# Patient Record
Sex: Male | Born: 1940 | Race: White | Hispanic: No | Marital: Married | Smoking: Never smoker
Health system: Southern US, Community
[De-identification: ages and names within clinical notes are randomized; demographics above are authoritative.]

## PROBLEM LIST (undated history)

## (undated) DIAGNOSIS — I509 Heart failure, unspecified: Secondary | ICD-10-CM

## (undated) DIAGNOSIS — T4145XA Adverse effect of unspecified anesthetic, initial encounter: Secondary | ICD-10-CM

## (undated) DIAGNOSIS — I2510 Atherosclerotic heart disease of native coronary artery without angina pectoris: Secondary | ICD-10-CM

## (undated) DIAGNOSIS — T8859XA Other complications of anesthesia, initial encounter: Secondary | ICD-10-CM

## (undated) DIAGNOSIS — E7800 Pure hypercholesterolemia, unspecified: Secondary | ICD-10-CM

## (undated) DIAGNOSIS — I10 Essential (primary) hypertension: Secondary | ICD-10-CM

## (undated) DIAGNOSIS — G4730 Sleep apnea, unspecified: Secondary | ICD-10-CM

## (undated) HISTORY — PX: CARDIAC SURGERY: SHX584

## (undated) HISTORY — PX: CARDIAC CATHETERIZATION: SHX172

---
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
## 1998-10-13 ENCOUNTER — Encounter: Payer: Self-pay | Admitting: Family Medicine

## 1998-10-13 ENCOUNTER — Ambulatory Visit (HOSPITAL_COMMUNITY): Admission: RE | Admit: 1998-10-13 | Discharge: 1998-10-13 | Payer: Self-pay | Admitting: Family Medicine

## 2014-08-07 ENCOUNTER — Emergency Department (HOSPITAL_BASED_OUTPATIENT_CLINIC_OR_DEPARTMENT_OTHER)
Admission: EM | Admit: 2014-08-07 | Discharge: 2014-08-07 | Disposition: A | Discharge status: Home / Self Care | Payer: Worker's Compensation | Attending: Emergency Medicine | Admitting: Emergency Medicine

## 2014-08-07 ENCOUNTER — Encounter (HOSPITAL_BASED_OUTPATIENT_CLINIC_OR_DEPARTMENT_OTHER): Payer: Self-pay | Admitting: Emergency Medicine

## 2014-08-07 DIAGNOSIS — Z880 Allergy status to penicillin: Secondary | ICD-10-CM | POA: Diagnosis not present

## 2014-08-07 DIAGNOSIS — S99919A Unspecified injury of unspecified ankle, initial encounter: Secondary | ICD-10-CM

## 2014-08-07 DIAGNOSIS — Y9389 Activity, other specified: Secondary | ICD-10-CM | POA: Insufficient documentation

## 2014-08-07 DIAGNOSIS — I10 Essential (primary) hypertension: Secondary | ICD-10-CM | POA: Insufficient documentation

## 2014-08-07 DIAGNOSIS — S8990XA Unspecified injury of unspecified lower leg, initial encounter: Secondary | ICD-10-CM | POA: Insufficient documentation

## 2014-08-07 DIAGNOSIS — S8391XA Sprain of unspecified site of right knee, initial encounter: Secondary | ICD-10-CM

## 2014-08-07 DIAGNOSIS — Z79899 Other long term (current) drug therapy: Secondary | ICD-10-CM | POA: Diagnosis not present

## 2014-08-07 DIAGNOSIS — E7800 Pure hypercholesterolemia, unspecified: Secondary | ICD-10-CM | POA: Diagnosis not present

## 2014-08-07 DIAGNOSIS — IMO0002 Reserved for concepts with insufficient information to code with codable children: Secondary | ICD-10-CM | POA: Diagnosis not present

## 2014-08-07 DIAGNOSIS — S99929A Unspecified injury of unspecified foot, initial encounter: Secondary | ICD-10-CM

## 2014-08-07 DIAGNOSIS — Y929 Unspecified place or not applicable: Secondary | ICD-10-CM | POA: Diagnosis not present

## 2014-08-07 DIAGNOSIS — W11XXXA Fall on and from ladder, initial encounter: Secondary | ICD-10-CM | POA: Insufficient documentation

## 2014-08-07 HISTORY — DX: Essential (primary) hypertension: I10

## 2014-08-07 HISTORY — DX: Pure hypercholesterolemia, unspecified: E78.00

## 2014-08-07 NOTE — Discharge Instructions (Signed)
Joint Sprain °A sprain is a tear or stretch in the ligaments that hold a joint together. Severe sprains may need as long as 3-6 weeks of immobilization and/or exercises to heal completely. Sprained joints should be rested and protected. If not, they can become unstable and prone to re-injury. Proper treatment can reduce your pain, shorten the period of disability, and reduce the risk of repeated injuries. °TREATMENT  °· Rest and elevate the injured joint to reduce pain and swelling. °· Apply ice packs to the injury for 20-30 minutes every 2-3 hours for the next 2-3 days. °· Keep the injury wrapped in a compression bandage or splint as long as the joint is painful or as instructed by your caregiver. °· Do not use the injured joint until it is completely healed to prevent re-injury and chronic instability. Follow the instructions of your caregiver. °· Long-term sprain management may require exercises and/or treatment by a physical therapist. Taping or special braces may help stabilize the joint until it is completely better. °SEEK MEDICAL CARE IF:  °· You develop increased pain or swelling of the joint. °· You develop increasing redness and warmth of the joint. °· You develop a fever. °· It becomes stiff. °· Your hand or foot gets cold or numb. °Document Released: 01/12/2005 Document Revised: 02/27/2012 Document Reviewed: 12/22/2008 °ExitCare® Patient Information ©2015 [employer]. This information is not intended to replace advice given to you by your health care provider. Make sure you discuss any questions you have with your health care provider. ° °

## 2014-08-07 NOTE — ED Notes (Signed)
Pt fell off 3 step ladder onto carpet around 8pm last night, c/o right knee pain and buttock pain. Pt able to ambulate.

## 2014-08-07 NOTE — ED Provider Notes (Signed)
CSN: [id number]     Arrival date & time 08/07/14  0125 History   First MD Initiated Contact with Patient 08/07/14 0133     Chief Complaint  Patient presents with  . Fall      Patient is a 73 y.o. male presenting with knee pain. The history is provided by the patient and a significant other.  Knee Pain Location:  Knee Time since incident:  6 hours Knee location:  R knee Pain details:    Quality:  Aching   Severity:  Mild   Onset quality:  Sudden   Timing:  Constant   Progression:  Unchanged Chronicity:  New Prior injury to area:  No Exacerbated by: walking. Associated symptoms: no back pain, no muscle weakness and no neck pain   PT was on a small step ladder, he fell off landing on his buttocks No head or neck injury He reports during the fall he "twisted" his right leg but no direct trauma to right leg.  He now reports pain in right knee   Past Medical History  Diagnosis Date  . Hypertension   . High cholesterol    History reviewed. No pertinent past surgical history. History reviewed. No pertinent family history. History  Substance Use Topics  . Smoking status: Never Smoker   . Smokeless tobacco: Not on file  . Alcohol Use: No    Review of Systems  Gastrointestinal: Negative for abdominal pain.  Musculoskeletal: Positive for arthralgias. Negative for back pain and neck pain.  Neurological: Negative for headaches.      Allergies  Amoxicillin and Tavist nd  Home Medications   Prior to Admission medications   Medication Sig Start Date End Date Taking? Authorizing Provider  fenofibrate (TRICOR) 145 MG tablet Take 145 mg by mouth daily.   Yes Historical Provider, MD  lisinopril (PRINIVIL,ZESTRIL) 20 MG tablet Take 20 mg by mouth daily.   Yes Historical Provider, MD   BP 158/93  Pulse 73  Temp(Src) 97.8 F (36.6 C) (Oral)  Resp 18  Ht 5\' 10"  (1.778 m)  Wt 200 lb (90.719 kg)  BMI 28.70 kg/m2  SpO2 100% Physical Exam CONSTITUTIONAL: Well developed/well  nourished HEAD: Normocephalic/atraumatic EYES: EOMI/PERRL ENMT: Mucous membranes moist NECK: supple no meningeal signs SPINE:entire spine nontender CV: S1/S2 noted, no murmurs/rubs/gallops noted LUNGS: Lungs are clear to auscultation bilaterally, no apparent distress ABDOMEN: soft, nontender, no rebound or guarding NEURO: Pt is awake/alert, moves all extremitiesx4 EXTREMITIES: pulses normal, full ROM Right knee effusion noted.  No erythema/warmth.  No bruising to knee.  No tenderness to right patella/prox fibula.  He can range right knee with full flex/extension.  He has tenderness with ROM of either hip. All other extremities/joints palpated/ranged and nontender SKIN: warm, color normal PSYCH: no abnormalities of mood noted  ED Course  Procedures  I offered imaging to patient of his right knee but he declined.  Low suspicion for acute bony injury given no direct trauma and no focal tenderness He does have effusion with possible sprain . Advised crutches and f/u with sports medicine MDM   Final diagnoses:  Right knee sprain, initial encounter    Nursing notes including past medical history and social history reviewed and considered in documentation     Joya Gaskinsonald W Catalina Salasar, MD 08/07/14 [phone]

## 2017-03-29 ENCOUNTER — Other Ambulatory Visit: Payer: Self-pay | Admitting: Physical Medicine and Rehabilitation

## 2017-03-29 DIAGNOSIS — N281 Cyst of kidney, acquired: Secondary | ICD-10-CM

## 2017-04-06 ENCOUNTER — Ambulatory Visit: Payer: Self-pay | Admitting: Orthopedic Surgery

## 2017-04-07 ENCOUNTER — Ambulatory Visit: Payer: Self-pay | Admitting: Orthopedic Surgery

## 2017-04-07 NOTE — H&P (Signed)
Joshua Mack is an 76 y.o. male.   Chief Complaint: back and RLE pain HPI: The patient is a 76 year old male who presents with back pain. The patient is here today in referral from Dr. Ethelene Hal. The patient reports low back symptoms including pain which began year(s) ago without any known injury. The pain radiates to the right thigh, right lower leg and right foot. The patient describes the pain as sharp. The patient describes the severity of their symptoms as 9 / 10. Symptoms are exacerbated by standing (and walking), while symptoms are not exacerbated by sitting. Current treatment includes nonsteroidal anti-inflammatory drugs (Ibuprofen). Prior to being seen today the patient was previously evaluated by Dr. Ethelene Hal. Past evaluation has included x-ray of the lumbar spine and MRI of the lumbar spine. Past treatment has included nonsteroidal anti-inflammatory drugs, epidural injections (L5-s1 right), physical therapy and SNRB :5-S1.  Sofia Jaquith is here from a referral from Dr. Ethelene Hal for surgical consultation. The patient reports a long history of back pain and now leg pain predominantly in the right lower extremity. He is limping. He is having numbness and weakness down into the legs. It has been progressive over the past years. Dr. Ethelene Hal has evaluated him and treated him with analgesics, activity modification, and has had him undergo epidural steroid injections. He had lumbar epidural at L5-S1 on the right. He has been treated for also knee pain in the past.  He has been doing essentially home exercise program, physical therapy, this seems to aggravate it, especially with extension.  REVIEW OF SYSTEMS Review of systems is negative for fevers, chest pain, shortness of breath, unexplained recent weight loss, loss of bowel or bladder function, burning with urination, joint swelling, rashes, weakness or numbness, difficulty with balance, easy bruising, excessive thirst or frequent urination.  He is here with  his wife.  He also reports that he still works. If he sits, it relieves his pain. If he stands, it aggravates it. He uses a grocery cart when he goes to the store.  Past Medical History:  Diagnosis Date  . High cholesterol   . Hypertension     No past surgical history on file.  No family history on file. Social History:  reports that he has never smoked. He does not have any smokeless tobacco history on file. He reports that he does not drink alcohol or use drugs.  Allergies:  Allergies  Allergen Reactions  . Amoxicillin   . Tavist Nd [Loratadine]      (Not in a hospital admission)  No results found for this or any previous visit (from the past 48 hour(s)). No results found.  Review of Systems  Constitutional: Negative.   HENT: Negative.   Eyes: Negative.   Respiratory: Negative.   Cardiovascular: Negative.   Gastrointestinal: Negative.   Genitourinary: Negative.   Musculoskeletal: Positive for back pain.  Skin: Negative.   Neurological: Positive for sensory change and focal weakness.  Psychiatric/Behavioral: Negative.     There were no vitals taken for this visit. Physical Exam  Constitutional: He is oriented to person, place, and time. He appears well-developed.  HENT:  Head: Normocephalic.  Eyes: Pupils are equal, round, and reactive to light.  Neck: Normal range of motion.  Cardiovascular: Normal rate.   Respiratory: Effort normal.  GI: Soft.  Musculoskeletal:  On exam, a healthy male, in moderate distress. Mood and affect is appropriate. He is tender in the lumbosacral junction. He has pain with extension, relieved with  forward flexion. Straight leg raise on the right, buttock, thigh, and calf pain and negative on the left. EHL and dorsiflexion on the left is 4/5 compared to the right. He also has decreased plantar flexion. Altered sensation in the L5 dermatome. Slightly weak quad on the right as well at 4+/5.  Lumbar spine exam reveals no evidence of soft  tissue swelling, deformity or skin ecchymosis. No flank pain with percussion. The abdomen is soft and nontender. Nontender over the trochanters. No cellulitis or lymphadenopathy.  Motor is 5/5 including tibialis anterior and hamstrings. Patient is normoreflexic. There is no Babinski or clonus. Patient has good distal pulses. No DVT. No pain and normal range of motion without instability of the hips, knees and ankles.  Painless range of motion of the cervical spine is noted.  Neurological: He is alert and oriented to person, place, and time.    Three-view x-rays, AP, lateral, flexion and extension demonstrates a grade 1 listhesis at 5-1 with end-stage degeneration without instability in flexion and extension. He has a transitional anatomy. He has severe spinal stenosis and congenital stenosis and a disc protrusion at L4-5 and extending into 3-4, moderate to severe on the right. Also encroaching on the left. At 5-1, there is small central disc protrusion. There is moderate foraminal narrowing.  A renal cyst is noted on the left.  Assessment/Plan 1. 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 2. Grade 1 isthmic spondylolisthesis at 5-1, end stage, bone-on-bone without instability in flexion and extension. Moderate neural foraminal narrowing at L5. 3. Moderate stenosis at 2-3. 4. Early foot drop with myotomal weakness, dermatomal dysesthesia on the right despite rest.  Extensive discussion concerning current pathology, relevant anatomy and treatment options; 25 minutes dedicated to this discussion, reviewing his pathology, relevant anatomy. I do feel he has symptomatic spinal stenosis. His predominant lesion is at L4-5 and L3-4. Given minimal back pain, predominantly claudication pain and radicular pain, I feel a microlumbar decompression at 3-4 and 4-5 would be appropriate. I do not feel a concomitant fusion would be would be required, as there is no instability at 5-1. There is  no stenosis at 5-1. 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 I do not feel the additional risk associated with a distraction or body effusion would add to a favorable outcome. I had an extensive discussion of the risks and benefits of the lumbar decompression with the patient including bleeding, infection, damage to neurovascular structures, epidural fibrosis, CSF leak requiring repair. We also discussed increase in pain, adjacent segment disease, recurrent disc herniation, need for future surgery including repeat decompression and/or fusion. We also discussed risks of postoperative hematoma, paralysis, anesthetic complications including DVT, PE, death, cardiopulmonary dysfunction. In addition, the perioperative and postoperative courses were discussed in detail including the rehabilitative time and return to functional activity and work. I provided the patient with an illustrated handout and utilized the appropriate surgical models.  We discussed time in the hospital, recovery, he is still working. He wants to return to work as soon as possible, though I indicated six weeks without lifting or bending, early ambulation. We indicated the severity of the stenosis and possibility of a dural rent. We will obtain preoperative clearance. I would like to re-examine him in a week, particularly on the right, if he is having increasing pain and/or numbness and weakness, we discussed urgency in terms of the decompression if this has been there, which he has described for months, then  certainly we would consider a decompression at an earlier date. I would not recommend continued observation or additional injections given the deficit and the partial foot drop at this point. I did discuss the goal of the decompression is to prevent further deficit. I do not feel he will need a concomitant fusion again. I discussed this with his wife. We will get preop clearance by Dr. Tiburcio Pea, and see him back in  a week for a repeat examination.  I appreciate the kind referral by Dr. Ethelene Hal. In the interim, we discussed Williams flexion program, avoid extension, favor flexion, etc., utilization of a cane given his antalgic gait and partial foot drop or utilization of the boot.  Plan microlumbar decompression L3-4, L4-5, possible L5-S1  Chayce Robbins, Dayna Barker., PA-C for Dr. Shelle Iron 04/07/2017, 11:50 AM

## 2017-04-10 ENCOUNTER — Ambulatory Visit
Admission: RE | Admit: 2017-04-10 | Discharge: 2017-04-10 | Disposition: A | Discharge status: Home / Self Care | Payer: Self-pay | Source: Ambulatory Visit | Attending: Physical Medicine and Rehabilitation | Admitting: Physical Medicine and Rehabilitation

## 2017-04-10 DIAGNOSIS — N281 Cyst of kidney, acquired: Secondary | ICD-10-CM

## 2017-04-11 ENCOUNTER — Other Ambulatory Visit: Payer: Self-pay | Admitting: Specialist

## 2017-04-26 NOTE — Patient Instructions (Addendum)
LEIB ELAHI  04/26/2017   Your procedure is scheduled on: 05-03-17   Report to [facility] Main  Entrance Take [facility]  elevators to 3rd floor to Short Stay Center at 0630 AM.    Call this number if you have problems the morning of surgery [phone]    Remember: ONLY 1 PERSON MAY GO WITH YOU TO SHORT STAY TO GET  READY MORNING OF YOUR SURGERY.  Do not eat food or drink liquids :After Midnight.     Take these medicines the morning of surgery with A SIP OF WATER: Amlodipine (Norvasc)                               You may not have any metal on your body including hair pins and              piercings  Do not wear jewelry, make-up, lotions, powders or perfumes, deodorant             Men may shave face and neck.   Do not bring valuables to the hospital. [facility] IS NOT             RESPONSIBLE   FOR VALUABLES.  Contacts, dentures or bridgework may not be worn into surgery.  Leave suitcase in the car. After surgery it may be brought to your room.       Special Instructions: Please bring the mask and tubing for your CPAP machine              Please read over the following fact sheets you were given: _____________________________________________________________________  [facility] - Preparing for Surgery Before surgery, you can play an important role.  Because skin is not sterile, your skin needs to be as free of germs as possible.  You can reduce the number of germs on your skin by washing with CHG (chlorahexidine gluconate) soap before surgery.  CHG is an antiseptic cleaner which kills germs and bonds with the skin to continue killing germs even after washing. Please DO NOT use if you have an allergy to CHG or antibacterial soaps.  If your skin becomes reddened/irritated stop using the CHG and inform your nurse when you arrive at Short Stay. Do not shave (including legs and underarms) for at least 48 hours prior to the first CHG shower.  You may shave your  face/neck. Please follow these instructions carefully:  1.  Shower with CHG Soap the night before surgery and the  morning of Surgery.  2.  If you choose to wash your hair, wash your hair first as usual with your  normal  shampoo.  3.  After you shampoo, rinse your hair and body thoroughly to remove the  shampoo.                           4.  Use CHG as you would any other liquid soap.  You can apply chg directly  to the skin and wash                       Gently with a scrungie or clean washcloth.  5.  Apply the CHG Soap to your body ONLY FROM THE NECK DOWN.   Do not use on face/ open  Wound or open sores. Avoid contact with eyes, ears mouth and genitals (private parts).                       Wash face,  Genitals (private parts) with your normal soap.             6.  Wash thoroughly, paying special attention to the area where your surgery  will be performed.  7.  Thoroughly rinse your body with warm water from the neck down.  8.  DO NOT shower/wash with your normal soap after using and rinsing off  the CHG Soap.                9.  Pat yourself dry with a clean towel.            10.  Wear clean pajamas.            11.  Place clean sheets on your bed the night of your first shower and do not  sleep with pets. Day of Surgery : Do not apply any lotions/deodorants the morning of surgery.  Please wear clean clothes to the hospital/surgery center.  FAILURE TO FOLLOW THESE INSTRUCTIONS MAY RESULT IN THE CANCELLATION OF YOUR SURGERY PATIENT SIGNATURE_________________________________  NURSE SIGNATURE__________________________________  ________________________________________________________________________   Adam Phenix  An incentive spirometer is a tool that can help keep your lungs clear and active. This tool measures how well you are filling your lungs with each breath. Taking long deep breaths may help reverse or decrease the chance of developing breathing  (pulmonary) problems (especially infection) following:  A long period of time when you are unable to move or be active. BEFORE THE PROCEDURE   If the spirometer includes an indicator to show your best effort, your nurse or respiratory therapist will set it to a desired goal.  If possible, sit up straight or lean slightly forward. Try not to slouch.  Hold the incentive spirometer in an upright position. INSTRUCTIONS FOR USE  1. Sit on the edge of your bed if possible, or sit up as far as you can in bed or on a chair. 2. Hold the incentive spirometer in an upright position. 3. Breathe out normally. 4. Place the mouthpiece in your mouth and seal your lips tightly around it. 5. Breathe in slowly and as deeply as possible, raising the piston or the ball toward the top of the column. 6. Hold your breath for 3-5 seconds or for as long as possible. Allow the piston or ball to fall to the bottom of the column. 7. Remove the mouthpiece from your mouth and breathe out normally. 8. Rest for a few seconds and repeat Steps 1 through 7 at least 10 times every 1-2 hours when you are awake. Take your time and take a few normal breaths between deep breaths. 9. The spirometer may include an indicator to show your best effort. Use the indicator as a goal to work toward during each repetition. 10. After each set of 10 deep breaths, practice coughing to be sure your lungs are clear. If you have an incision (the cut made at the time of surgery), support your incision when coughing by placing a pillow or rolled up towels firmly against it. Once you are able to get out of bed, walk around indoors and cough well. You may stop using the incentive spirometer when instructed by your caregiver.  RISKS AND COMPLICATIONS  Take your time so you do not get  dizzy or light-headed.  If you are in pain, you may need to take or ask for pain medication before doing incentive spirometry. It is harder to take a deep breath if you  are having pain. AFTER USE  Rest and breathe slowly and easily.  It can be helpful to keep track of a log of your progress. Your caregiver can provide you with a simple table to help with this. If you are using the spirometer at home, follow these instructions: SEEK MEDICAL CARE IF:   You are having difficultly using the spirometer.  You have trouble using the spirometer as often as instructed.  Your pain medication is not giving enough relief while using the spirometer.  You develop fever of 100.5 F (38.1 C) or higher. SEEK IMMEDIATE MEDICAL CARE IF:   You cough up bloody sputum that had not been present before.  You develop fever of 102 F (38.9 C) or greater.  You develop worsening pain at or near the incision site. MAKE SURE YOU:   Understand these instructions.  Will watch your condition.  Will get help right away if you are not doing well or get worse. Document Released: 04/17/2007 Document Revised: 02/27/2012 Document Reviewed: 06/18/2007 [facility] LLCExitCare Patient Information 2014 [place]Care, [place].   ________________________________________________________________________

## 2017-04-26 NOTE — Progress Notes (Signed)
03-31-17 Surgical clearance from Dr. Wynelle LinkSun on chart 03-31-17 EKG on chart

## 2017-04-27 ENCOUNTER — Encounter (HOSPITAL_COMMUNITY): Payer: Self-pay

## 2017-04-27 ENCOUNTER — Encounter (HOSPITAL_COMMUNITY)
Admission: RE | Admit: 2017-04-27 | Discharge: 2017-04-27 | Disposition: A | Discharge status: Home / Self Care | Payer: BLUE CROSS/BLUE SHIELD | Source: Ambulatory Visit | Attending: Specialist | Admitting: Specialist

## 2017-04-27 ENCOUNTER — Ambulatory Visit (HOSPITAL_COMMUNITY)
Admission: RE | Admit: 2017-04-27 | Discharge: 2017-04-27 | Disposition: A | Discharge status: Home / Self Care | Payer: BLUE CROSS/BLUE SHIELD | Source: Ambulatory Visit | Attending: Orthopedic Surgery | Admitting: Orthopedic Surgery

## 2017-04-27 DIAGNOSIS — M5126 Other intervertebral disc displacement, lumbar region: Secondary | ICD-10-CM | POA: Diagnosis present

## 2017-04-27 HISTORY — DX: Sleep apnea, unspecified: G47.30

## 2017-04-27 LAB — CBC
HCT: 40.7 % (ref 39.0–52.0)
Hemoglobin: 14.2 g/dL (ref 13.0–17.0)
MCH: 30.6 pg (ref 26.0–34.0)
MCHC: 34.9 g/dL (ref 30.0–36.0)
MCV: 87.7 fL (ref 78.0–100.0)
Platelets: 400 10*3/uL (ref 150–400)
RBC: 4.64 MIL/uL (ref 4.22–5.81)
RDW: 12.7 % (ref 11.5–15.5)
WBC: 8 10*3/uL (ref 4.0–10.5)

## 2017-04-27 LAB — BASIC METABOLIC PANEL
Anion gap: 9 (ref 5–15)
BUN: 13 mg/dL (ref 6–20)
CO2: 28 mmol/L (ref 22–32)
Calcium: 9.6 mg/dL (ref 8.9–10.3)
Chloride: 97 mmol/L — ABNORMAL LOW (ref 101–111)
Creatinine, Ser: 1 mg/dL (ref 0.61–1.24)
GFR calc Af Amer: >60 mL/min (ref >60)
GFR calc non Af Amer: >60 mL/min (ref >60)
Glucose, Bld: 126 mg/dL — ABNORMAL HIGH (ref 65–99)
Potassium: 4.3 mmol/L (ref 3.5–5.1)
Sodium: 134 mmol/L — ABNORMAL LOW (ref 135–145)

## 2017-04-27 LAB — SURGICAL PCR SCREEN
MRSA, PCR: NEGATIVE
Staphylococcus aureus: NEGATIVE

## 2017-05-02 ENCOUNTER — Encounter (HOSPITAL_COMMUNITY): Payer: Self-pay | Admitting: Anesthesiology

## 2017-05-02 NOTE — Anesthesia Preprocedure Evaluation (Addendum)
Anesthesia Evaluation  Patient identified by MRN, date of birth, ID band Patient awake    Reviewed: Allergy & Precautions, NPO status , Patient's Chart, lab work & pertinent test results  Airway Mallampati: IV       Dental no notable dental hx. (+) Teeth Intact   Pulmonary    Pulmonary exam normal breath sounds clear to auscultation       Cardiovascular hypertension, Pt. on medications Normal cardiovascular exam Rhythm:Regular Rate:Normal     Neuro/Psych negative neurological ROS  negative psych ROS   GI/Hepatic negative GI ROS, Neg liver ROS,   Endo/Other  negative endocrine ROS  Renal/GU negative Renal ROS  negative genitourinary   Musculoskeletal   Abdominal Normal abdominal exam  (+)   Peds  Hematology negative hematology ROS (+)   Anesthesia Other Findings   Reproductive/Obstetrics                            Anesthesia Physical Anesthesia Plan  ASA: II  Anesthesia Plan: General   Post-op Pain Management:    Induction: Intravenous  Airway Management Planned: Oral ETT and Video Laryngoscope Planned  Additional Equipment:   Intra-op Plan:   Post-operative Plan: Extubation in OR  Informed Consent: I have reviewed the patients History and Physical, chart, labs and discussed the procedure including the risks, benefits and alternatives for the proposed anesthesia with the patient or authorized representative who has indicated his/her understanding and acceptance.     Plan Discussed with: CRNA and Surgeon  Anesthesia Plan Comments: (No Versed or fentanyl. Substitute Dilaudid for fentanyl.)       Anesthesia Quick Evaluation

## 2017-05-03 ENCOUNTER — Encounter (HOSPITAL_COMMUNITY): Payer: Self-pay | Admitting: *Deleted

## 2017-05-03 ENCOUNTER — Ambulatory Visit (HOSPITAL_COMMUNITY): Payer: BLUE CROSS/BLUE SHIELD

## 2017-05-03 ENCOUNTER — Ambulatory Visit (HOSPITAL_COMMUNITY): Payer: BLUE CROSS/BLUE SHIELD | Admitting: Anesthesiology

## 2017-05-03 ENCOUNTER — Encounter (HOSPITAL_COMMUNITY)
Admission: AD | Disposition: A | Discharge status: Home / Self Care | Payer: Self-pay | Source: Ambulatory Visit | Attending: Specialist

## 2017-05-03 ENCOUNTER — Inpatient Hospital Stay (HOSPITAL_COMMUNITY)
Admission: AD | Admit: 2017-05-03 | Discharge: 2017-05-09 | DRG: 459 | Disposition: A | Discharge status: Home / Self Care | Payer: BLUE CROSS/BLUE SHIELD | Source: Ambulatory Visit | Attending: Specialist | Admitting: Specialist

## 2017-05-03 DIAGNOSIS — M48062 Spinal stenosis, lumbar region with neurogenic claudication: Secondary | ICD-10-CM | POA: Diagnosis present

## 2017-05-03 DIAGNOSIS — E871 Hypo-osmolality and hyponatremia: Secondary | ICD-10-CM

## 2017-05-03 DIAGNOSIS — E7800 Pure hypercholesterolemia, unspecified: Secondary | ICD-10-CM | POA: Diagnosis present

## 2017-05-03 DIAGNOSIS — N281 Cyst of kidney, acquired: Secondary | ICD-10-CM | POA: Diagnosis present

## 2017-05-03 DIAGNOSIS — R609 Edema, unspecified: Secondary | ICD-10-CM | POA: Diagnosis not present

## 2017-05-03 DIAGNOSIS — M21371 Foot drop, right foot: Secondary | ICD-10-CM | POA: Diagnosis present

## 2017-05-03 DIAGNOSIS — J189 Pneumonia, unspecified organism: Secondary | ICD-10-CM | POA: Diagnosis not present

## 2017-05-03 DIAGNOSIS — Z881 Allergy status to other antibiotic agents status: Secondary | ICD-10-CM

## 2017-05-03 DIAGNOSIS — R338 Other retention of urine: Secondary | ICD-10-CM | POA: Diagnosis present

## 2017-05-03 DIAGNOSIS — I82432 Acute embolism and thrombosis of left popliteal vein: Secondary | ICD-10-CM | POA: Diagnosis not present

## 2017-05-03 DIAGNOSIS — Z419 Encounter for procedure for purposes other than remedying health state, unspecified: Secondary | ICD-10-CM

## 2017-05-03 DIAGNOSIS — R0602 Shortness of breath: Secondary | ICD-10-CM

## 2017-05-03 DIAGNOSIS — I340 Nonrheumatic mitral (valve) insufficiency: Secondary | ICD-10-CM | POA: Diagnosis not present

## 2017-05-03 DIAGNOSIS — R0902 Hypoxemia: Secondary | ICD-10-CM | POA: Diagnosis not present

## 2017-05-03 DIAGNOSIS — Z888 Allergy status to other drugs, medicaments and biological substances status: Secondary | ICD-10-CM | POA: Diagnosis not present

## 2017-05-03 DIAGNOSIS — Z79899 Other long term (current) drug therapy: Secondary | ICD-10-CM | POA: Diagnosis not present

## 2017-05-03 DIAGNOSIS — J9601 Acute respiratory failure with hypoxia: Secondary | ICD-10-CM | POA: Diagnosis not present

## 2017-05-03 DIAGNOSIS — I82409 Acute embolism and thrombosis of unspecified deep veins of unspecified lower extremity: Secondary | ICD-10-CM

## 2017-05-03 DIAGNOSIS — K567 Ileus, unspecified: Secondary | ICD-10-CM | POA: Diagnosis not present

## 2017-05-03 DIAGNOSIS — G4733 Obstructive sleep apnea (adult) (pediatric): Secondary | ICD-10-CM

## 2017-05-03 DIAGNOSIS — M48061 Spinal stenosis, lumbar region without neurogenic claudication: Secondary | ICD-10-CM | POA: Diagnosis present

## 2017-05-03 DIAGNOSIS — N401 Enlarged prostate with lower urinary tract symptoms: Secondary | ICD-10-CM | POA: Diagnosis present

## 2017-05-03 DIAGNOSIS — Z885 Allergy status to narcotic agent status: Secondary | ICD-10-CM | POA: Diagnosis not present

## 2017-05-03 DIAGNOSIS — Y838 Other surgical procedures as the cause of abnormal reaction of the patient, or of later complication, without mention of misadventure at the time of the procedure: Secondary | ICD-10-CM | POA: Diagnosis not present

## 2017-05-03 DIAGNOSIS — M4317 Spondylolisthesis, lumbosacral region: Secondary | ICD-10-CM | POA: Diagnosis present

## 2017-05-03 DIAGNOSIS — I10 Essential (primary) hypertension: Secondary | ICD-10-CM | POA: Diagnosis present

## 2017-05-03 DIAGNOSIS — J9589 Other postprocedural complications and disorders of respiratory system, not elsewhere classified: Secondary | ICD-10-CM | POA: Diagnosis not present

## 2017-05-03 DIAGNOSIS — J9691 Respiratory failure, unspecified with hypoxia: Secondary | ICD-10-CM

## 2017-05-03 HISTORY — PX: LUMBAR LAMINECTOMY/DECOMPRESSION MICRODISCECTOMY: SHX5026

## 2017-05-03 SURGERY — LUMBAR LAMINECTOMY/DECOMPRESSION MICRODISCECTOMY 3 LEVELS
Anesthesia: General

## 2017-05-03 MED ORDER — MAGNESIUM CITRATE PO SOLN: 1.0000 | Freq: Once | ORAL | Status: DC | PRN

## 2017-05-03 MED ORDER — ACETAMINOPHEN 650 MG RE SUPP: 650.0000 mg | RECTAL | Status: DC | PRN

## 2017-05-03 MED ORDER — PHENYLEPHRINE 40 MCG/ML (10ML) SYRINGE FOR IV PUSH (FOR BLOOD PRESSURE SUPPORT)
PREFILLED_SYRINGE | INTRAVENOUS | Status: AC
  Filled 2017-05-03: qty 10

## 2017-05-03 MED ORDER — PHENOL 1.4 % MT LIQD: 1.0000 | OROMUCOSAL | Status: DC | PRN

## 2017-05-03 MED ORDER — URINOZINC PLUS PO TABS: 1.0000 | ORAL_TABLET | Freq: Every day | ORAL | Status: DC

## 2017-05-03 MED ORDER — SUCCINYLCHOLINE CHLORIDE 200 MG/10ML IV SOSY
PREFILLED_SYRINGE | INTRAVENOUS | Status: AC
Start: 2017-05-03 — End: 2017-05-03
  Filled 2017-05-03: qty 10

## 2017-05-03 MED ORDER — RISAQUAD PO CAPS
1.0000 | ORAL_CAPSULE | Freq: Every day | ORAL | Status: DC
  Administered 2017-05-03 – 2017-05-09 (×7): 1 via ORAL
  Filled 2017-05-03 (×7): qty 1

## 2017-05-03 MED ORDER — ROCURONIUM BROMIDE 10 MG/ML (PF) SYRINGE
PREFILLED_SYRINGE | INTRAVENOUS | Status: DC | PRN
  Administered 2017-05-03: 40 mg via INTRAVENOUS
  Administered 2017-05-03: 10 mg via INTRAVENOUS

## 2017-05-03 MED ORDER — HYDROMORPHONE HCL 1 MG/ML IJ SOLN
INTRAMUSCULAR | Status: DC | PRN
  Administered 2017-05-03: .4 mg via INTRAVENOUS
  Administered 2017-05-03 (×3): .2 mg via INTRAVENOUS
  Administered 2017-05-03: .6 mg via INTRAVENOUS

## 2017-05-03 MED ORDER — ACETAMINOPHEN 325 MG PO TABS
650.0000 mg | ORAL_TABLET | ORAL | Status: DC | PRN
  Administered 2017-05-05: 650 mg via ORAL
  Filled 2017-05-03: qty 2

## 2017-05-03 MED ORDER — KETOROLAC TROMETHAMINE 30 MG/ML IJ SOLN: 30.0000 mg | Freq: Once | INTRAMUSCULAR | Status: DC | PRN

## 2017-05-03 MED ORDER — HYDROMORPHONE HCL 1 MG/ML IJ SOLN
0.2500 mg | INTRAMUSCULAR | Status: DC | PRN
  Administered 2017-05-03: 0.25 mg via INTRAVENOUS
  Administered 2017-05-03: 0.5 mg via INTRAVENOUS

## 2017-05-03 MED ORDER — ACETAMINOPHEN 500 MG PO TABS
1000.0000 mg | ORAL_TABLET | Freq: Two times a day (BID) | ORAL | Status: DC
  Administered 2017-05-04 – 2017-05-05 (×4): 1000 mg via ORAL
  Filled 2017-05-03 (×4): qty 2

## 2017-05-03 MED ORDER — MENTHOL 3 MG MT LOZG: 1.0000 | LOZENGE | OROMUCOSAL | Status: DC | PRN

## 2017-05-03 MED ORDER — ACETAMINOPHEN 10 MG/ML IV SOLN
1000.0000 mg | Freq: Once | INTRAVENOUS | Status: AC
  Administered 2017-05-03: 1000 mg via INTRAVENOUS
  Filled 2017-05-03: qty 100

## 2017-05-03 MED ORDER — SUGAMMADEX SODIUM 200 MG/2ML IV SOLN
INTRAVENOUS | Status: AC
  Filled 2017-05-03: qty 2

## 2017-05-03 MED ORDER — DOCUSATE SODIUM 100 MG PO CAPS
100.0000 mg | ORAL_CAPSULE | Freq: Two times a day (BID) | ORAL | Status: DC
  Administered 2017-05-03 – 2017-05-09 (×11): 100 mg via ORAL
  Filled 2017-05-03 (×11): qty 1

## 2017-05-03 MED ORDER — ONDANSETRON HCL 4 MG/2ML IJ SOLN
INTRAMUSCULAR | Status: DC | PRN
  Administered 2017-05-03: 4 mg via INTRAVENOUS

## 2017-05-03 MED ORDER — RESVERATROL 250 MG PO CAPS: 250.0000 mg | ORAL_CAPSULE | Freq: Every day | ORAL | Status: DC

## 2017-05-03 MED ORDER — CHLORHEXIDINE GLUCONATE 4 % EX LIQD: 60.0000 mL | Freq: Once | CUTANEOUS | Status: DC

## 2017-05-03 MED ORDER — METHOCARBAMOL 1000 MG/10ML IJ SOLN
500.0000 mg | Freq: Four times a day (QID) | INTRAVENOUS | Status: DC | PRN
  Administered 2017-05-03: 500 mg via INTRAVENOUS
  Filled 2017-05-03: qty 550

## 2017-05-03 MED ORDER — DEXAMETHASONE SODIUM PHOSPHATE 10 MG/ML IJ SOLN
INTRAMUSCULAR | Status: AC
  Filled 2017-05-03: qty 1

## 2017-05-03 MED ORDER — METHOCARBAMOL 500 MG PO TABS
500.0000 mg | ORAL_TABLET | Freq: Four times a day (QID) | ORAL | Status: DC | PRN
  Administered 2017-05-04 – 2017-05-05 (×3): 500 mg via ORAL
  Filled 2017-05-03 (×4): qty 1

## 2017-05-03 MED ORDER — THROMBIN 5000 UNITS EX SOLR
CUTANEOUS | Status: AC
  Filled 2017-05-03: qty 10000

## 2017-05-03 MED ORDER — EPHEDRINE SULFATE-NACL 50-0.9 MG/10ML-% IV SOSY
PREFILLED_SYRINGE | INTRAVENOUS | Status: DC | PRN
  Administered 2017-05-03: 5 mg via INTRAVENOUS
  Administered 2017-05-03: 10 mg via INTRAVENOUS
  Administered 2017-05-03 (×3): 5 mg via INTRAVENOUS

## 2017-05-03 MED ORDER — HYDROMORPHONE HCL 1 MG/ML IJ SOLN
INTRAMUSCULAR | Status: AC
  Filled 2017-05-03: qty 1

## 2017-05-03 MED ORDER — EPHEDRINE 5 MG/ML INJ
INTRAVENOUS | Status: AC
  Filled 2017-05-03: qty 10

## 2017-05-03 MED ORDER — ROCURONIUM BROMIDE 50 MG/5ML IV SOSY
PREFILLED_SYRINGE | INTRAVENOUS | Status: AC
  Filled 2017-05-03: qty 5

## 2017-05-03 MED ORDER — CEFAZOLIN SODIUM-DEXTROSE 2-4 GM/100ML-% IV SOLN
INTRAVENOUS | Status: AC
  Filled 2017-05-03: qty 100

## 2017-05-03 MED ORDER — PROPOFOL 10 MG/ML IV BOLUS
INTRAVENOUS | Status: DC | PRN
  Administered 2017-05-03: 200 mg via INTRAVENOUS

## 2017-05-03 MED ORDER — PROPOFOL 10 MG/ML IV BOLUS
INTRAVENOUS | Status: AC
  Filled 2017-05-03: qty 20

## 2017-05-03 MED ORDER — CHLORHEXIDINE GLUCONATE 0.12 % MT SOLN
15.0000 mL | Freq: Two times a day (BID) | OROMUCOSAL | Status: DC
  Administered 2017-05-03 (×2): 15 mL via OROMUCOSAL
  Filled 2017-05-03 (×2): qty 15

## 2017-05-03 MED ORDER — ALUM & MAG HYDROXIDE-SIMETH 200-200-20 MG/5ML PO SUSP: 30.0000 mL | Freq: Four times a day (QID) | ORAL | Status: DC | PRN

## 2017-05-03 MED ORDER — OXYCODONE-ACETAMINOPHEN 5-325 MG PO TABS: 1.0000 | ORAL_TABLET | ORAL | 0 refills | Status: DC | PRN

## 2017-05-03 MED ORDER — POLYETHYLENE GLYCOL 3350 17 G PO PACK: 17.0000 g | PACK | Freq: Every day | ORAL | 0 refills | Status: DC

## 2017-05-03 MED ORDER — SODIUM CHLORIDE 0.9 % IR SOLN
Status: AC
  Filled 2017-05-03: qty 500000

## 2017-05-03 MED ORDER — OXYCODONE HCL 5 MG PO TABS
5.0000 mg | ORAL_TABLET | ORAL | Status: DC | PRN
  Administered 2017-05-03: 5 mg via ORAL
  Administered 2017-05-04: 10 mg via ORAL
  Administered 2017-05-04: 5 mg via ORAL
  Administered 2017-05-04 – 2017-05-06 (×7): 10 mg via ORAL
  Filled 2017-05-03 (×11): qty 2

## 2017-05-03 MED ORDER — POLYETHYLENE GLYCOL 3350 17 G PO PACK: 17.0000 g | PACK | Freq: Every day | ORAL | Status: DC | PRN

## 2017-05-03 MED ORDER — MELATONIN 5 MG PO TABS: 5.0000 mg | ORAL_TABLET | Freq: Every day | ORAL | Status: DC

## 2017-05-03 MED ORDER — CEFAZOLIN SODIUM-DEXTROSE 2-4 GM/100ML-% IV SOLN
2.0000 g | INTRAVENOUS | Status: AC
  Administered 2017-05-03: 2 g via INTRAVENOUS

## 2017-05-03 MED ORDER — PROMETHAZINE HCL 25 MG/ML IJ SOLN: 6.2500 mg | INTRAMUSCULAR | Status: DC | PRN

## 2017-05-03 MED ORDER — LIDOCAINE 2% (20 MG/ML) 5 ML SYRINGE
INTRAMUSCULAR | Status: AC
  Filled 2017-05-03: qty 5

## 2017-05-03 MED ORDER — LACTATED RINGERS IV SOLN
INTRAVENOUS | Status: DC
  Administered 2017-05-03 – 2017-05-05 (×4): via INTRAVENOUS

## 2017-05-03 MED ORDER — AMLODIPINE BESYLATE 5 MG PO TABS
5.0000 mg | ORAL_TABLET | Freq: Every day | ORAL | Status: DC
  Administered 2017-05-04 – 2017-05-09 (×6): 5 mg via ORAL
  Filled 2017-05-03 (×6): qty 1

## 2017-05-03 MED ORDER — BISACODYL 5 MG PO TBEC
5.0000 mg | DELAYED_RELEASE_TABLET | Freq: Every day | ORAL | Status: DC | PRN
  Administered 2017-05-05: 5 mg via ORAL
  Filled 2017-05-03: qty 1

## 2017-05-03 MED ORDER — KCL IN DEXTROSE-NACL 20-5-0.45 MEQ/L-%-% IV SOLN
INTRAVENOUS | Status: AC
  Administered 2017-05-03: 16:00:00 via INTRAVENOUS
  Filled 2017-05-03 (×2): qty 1000

## 2017-05-03 MED ORDER — LIDOCAINE 2% (20 MG/ML) 5 ML SYRINGE
INTRAMUSCULAR | Status: DC | PRN
  Administered 2017-05-03: 100 mg via INTRAVENOUS

## 2017-05-03 MED ORDER — ONDANSETRON HCL 4 MG/2ML IJ SOLN
4.0000 mg | Freq: Four times a day (QID) | INTRAMUSCULAR | Status: DC | PRN
  Administered 2017-05-05: 4 mg via INTRAVENOUS
  Filled 2017-05-03: qty 2

## 2017-05-03 MED ORDER — ACETAMINOPHEN 10 MG/ML IV SOLN
INTRAVENOUS | Status: AC
  Filled 2017-05-03: qty 100

## 2017-05-03 MED ORDER — THROMBIN 5000 UNITS EX SOLR
CUTANEOUS | Status: DC | PRN
  Administered 2017-05-03: 5000 [IU] via TOPICAL

## 2017-05-03 MED ORDER — ONDANSETRON HCL 4 MG PO TABS
4.0000 mg | ORAL_TABLET | Freq: Four times a day (QID) | ORAL | Status: DC | PRN
  Administered 2017-05-04: 4 mg via ORAL
  Filled 2017-05-03: qty 1

## 2017-05-03 MED ORDER — DOCUSATE SODIUM 100 MG PO CAPS: 100.0000 mg | ORAL_CAPSULE | Freq: Two times a day (BID) | ORAL | 1 refills | Status: DC | PRN

## 2017-05-03 MED ORDER — ACETAMINOPHEN 10 MG/ML IV SOLN
1000.0000 mg | INTRAVENOUS | Status: AC
  Administered 2017-05-03: 1000 mg via INTRAVENOUS

## 2017-05-03 MED ORDER — PHENYLEPHRINE 40 MCG/ML (10ML) SYRINGE FOR IV PUSH (FOR BLOOD PRESSURE SUPPORT)
PREFILLED_SYRINGE | INTRAVENOUS | Status: DC | PRN
  Administered 2017-05-03 (×2): 80 ug via INTRAVENOUS
  Administered 2017-05-03: 40 ug via INTRAVENOUS
  Administered 2017-05-03 (×2): 80 ug via INTRAVENOUS

## 2017-05-03 MED ORDER — SODIUM CHLORIDE 0.9 % IR SOLN
Status: DC | PRN
  Administered 2017-05-03: 500 mL

## 2017-05-03 MED ORDER — SUCCINYLCHOLINE CHLORIDE 200 MG/10ML IV SOSY
PREFILLED_SYRINGE | INTRAVENOUS | Status: DC | PRN
  Administered 2017-05-03: 120 mg via INTRAVENOUS

## 2017-05-03 MED ORDER — HYDROMORPHONE HCL 2 MG/ML IJ SOLN
INTRAMUSCULAR | Status: AC
  Filled 2017-05-03: qty 1

## 2017-05-03 MED ORDER — HYDROMORPHONE HCL 1 MG/ML IJ SOLN
1.0000 mg | INTRAMUSCULAR | Status: DC | PRN
  Administered 2017-05-04 – 2017-05-05 (×4): 1 mg via INTRAVENOUS
  Filled 2017-05-03 (×5): qty 1

## 2017-05-03 MED ORDER — LIDOCAINE-EPINEPHRINE 1 %-1:100000 IJ SOLN
INTRAMUSCULAR | Status: DC | PRN
  Administered 2017-05-03: 14 mL

## 2017-05-03 MED ORDER — CEFAZOLIN SODIUM-DEXTROSE 2-4 GM/100ML-% IV SOLN
2.0000 g | Freq: Three times a day (TID) | INTRAVENOUS | Status: AC
  Administered 2017-05-03 – 2017-05-04 (×3): 2 g via INTRAVENOUS
  Filled 2017-05-03 (×3): qty 100

## 2017-05-03 MED ORDER — DOCUSATE SODIUM 100 MG PO CAPS: 100.0000 mg | ORAL_CAPSULE | Freq: Two times a day (BID) | ORAL | 2 refills | Status: AC

## 2017-05-03 MED ORDER — DEXAMETHASONE SODIUM PHOSPHATE 10 MG/ML IJ SOLN
INTRAMUSCULAR | Status: DC | PRN
  Administered 2017-05-03: 10 mg via INTRAVENOUS

## 2017-05-03 MED ORDER — LIDOCAINE-EPINEPHRINE 1 %-1:100000 IJ SOLN
INTRAMUSCULAR | Status: AC
  Filled 2017-05-03: qty 1

## 2017-05-03 MED ORDER — ONDANSETRON HCL 4 MG/2ML IJ SOLN
INTRAMUSCULAR | Status: AC
  Filled 2017-05-03: qty 2

## 2017-05-03 MED ORDER — ORAL CARE MOUTH RINSE
15.0000 mL | Freq: Two times a day (BID) | OROMUCOSAL | Status: DC
  Administered 2017-05-03: 15 mL via OROMUCOSAL

## 2017-05-03 MED ORDER — FENTANYL CITRATE (PF) 100 MCG/2ML IJ SOLN
INTRAMUSCULAR | Status: AC
  Filled 2017-05-03: qty 2

## 2017-05-03 SURGICAL SUPPLY — 50 items
BAG ZIPLOCK 12X15 (MISCELLANEOUS) IMPLANT
BANDAGE ELASTIC 4 VELCRO ST LF (GAUZE/BANDAGES/DRESSINGS) ×2 IMPLANT
CLEANER TIP ELECTROSURG 2X2 (MISCELLANEOUS) ×2 IMPLANT
CLOTH 2% CHLOROHEXIDINE 3PK (PERSONAL CARE ITEMS) ×2 IMPLANT
COVER SURGICAL LIGHT HANDLE (MISCELLANEOUS) ×2 IMPLANT
DRAPE MICROSCOPE LEICA (MISCELLANEOUS) ×2 IMPLANT
DRAPE SHEET LG 3/4 BI-LAMINATE (DRAPES) IMPLANT
DRAPE SURG 17X11 SM STRL (DRAPES) ×2 IMPLANT
DRAPE UTILITY XL STRL (DRAPES) ×2 IMPLANT
DRSG ADAPTIC 3X8 NADH LF (GAUZE/BANDAGES/DRESSINGS) ×2 IMPLANT
DRSG AQUACEL AG ADV 3.5X 4 (GAUZE/BANDAGES/DRESSINGS) IMPLANT
DRSG AQUACEL AG ADV 3.5X 6 (GAUZE/BANDAGES/DRESSINGS) IMPLANT
DURAPREP 26ML APPLICATOR (WOUND CARE) ×2 IMPLANT
DURASEAL SPINE SEALANT 3ML (MISCELLANEOUS) IMPLANT
ELECT BLADE TIP CTD 4 INCH (ELECTRODE) ×2 IMPLANT
ELECT REM PT RETURN 15FT ADLT (MISCELLANEOUS) ×2 IMPLANT
GAUZE SPONGE 4X4 12PLY STRL (GAUZE/BANDAGES/DRESSINGS) ×2 IMPLANT
GLOVE BIOGEL PI IND STRL 7.0 (GLOVE) ×1 IMPLANT
GLOVE BIOGEL PI INDICATOR 7.0 (GLOVE) ×1
GLOVE SURG SS PI 7.0 STRL IVOR (GLOVE) ×2 IMPLANT
GLOVE SURG SS PI 7.5 STRL IVOR (GLOVE) ×2 IMPLANT
GLOVE SURG SS PI 8.0 STRL IVOR (GLOVE) ×4 IMPLANT
GOWN STRL REUS W/TWL XL LVL3 (GOWN DISPOSABLE) ×4 IMPLANT
HEMOSTAT SPONGE AVITENE ULTRA (HEMOSTASIS) IMPLANT
IV CATH 14GX2 1/4 (CATHETERS) IMPLANT
KIT BASIN OR (CUSTOM PROCEDURE TRAY) ×2 IMPLANT
KIT POSITIONING SURG ANDREWS (MISCELLANEOUS) ×2 IMPLANT
MANIFOLD NEPTUNE II (INSTRUMENTS) ×2 IMPLANT
NEEDLE SPNL 18GX3.5 QUINCKE PK (NEEDLE) ×4 IMPLANT
PACK LAMINECTOMY ORTHO (CUSTOM PROCEDURE TRAY) ×2 IMPLANT
PATTIES SURGICAL .5 X.5 (GAUZE/BANDAGES/DRESSINGS) ×2 IMPLANT
PATTIES SURGICAL .75X.75 (GAUZE/BANDAGES/DRESSINGS) ×2 IMPLANT
RUBBERBAND STERILE (MISCELLANEOUS) ×4 IMPLANT
SPONGE LAP 4X18 X RAY DECT (DISPOSABLE) ×4 IMPLANT
SPONGE SURGIFOAM ABS GEL 100 (HEMOSTASIS) ×2 IMPLANT
STAPLER VISISTAT (STAPLE) IMPLANT
STRIP CLOSURE SKIN 1/2X4 (GAUZE/BANDAGES/DRESSINGS) IMPLANT
SUT NURALON 4 0 TR CR/8 (SUTURE) IMPLANT
SUT PROLENE 3 0 PS 2 (SUTURE) IMPLANT
SUT STRATAFIX 0 PDS 27 VIOLET (SUTURE) ×2
SUT VIC AB 1 CT1 27 (SUTURE)
SUT VIC AB 1 CT1 27XBRD ANTBC (SUTURE) IMPLANT
SUT VIC AB 1-0 CT2 27 (SUTURE) IMPLANT
SUT VIC AB 2-0 CT1 27 (SUTURE) ×1
SUT VIC AB 2-0 CT1 TAPERPNT 27 (SUTURE) ×1 IMPLANT
SUT VIC AB 2-0 CT2 27 (SUTURE) IMPLANT
SUTURE STRATFX 0 PDS 27 VIOLET (SUTURE) ×1 IMPLANT
SYR 3ML LL SCALE MARK (SYRINGE) IMPLANT
TOWEL OR 17X26 10 PK STRL BLUE (TOWEL DISPOSABLE) ×2 IMPLANT
YANKAUER SUCT BULB TIP NO VENT (SUCTIONS) ×2 IMPLANT

## 2017-05-03 NOTE — H&P (Signed)
Joshua Mack is an 76 y.o. male.   Chief Complaint: Right leg pain and numbness.  HPI: Severe spinal stenosis L45 and L34 with right leg pain weakness and numbness  Past Medical History:  Diagnosis Date  . High cholesterol   . Hypertension   . Sleep apnea     History reviewed. No pertinent surgical history.  History reviewed. No pertinent family history. Social History:  reports that he has never smoked. He has never used smokeless tobacco. He reports that he does not drink alcohol or use drugs.  Allergies:  Allergies  Allergen Reactions  . Fentanyl Other (See Comments)    Had a reaction during a colonoscopy in the past. He was told it required Benadryl.  . Midazolam Other (See Comments)    Had a reaction during a colonoscopy in the past. He was told it required Benadryl.  . Amoxicillin Rash    Has patient had a PCN reaction causing immediate rash, facial/tongue/throat swelling, SOB or lightheadedness with hypotension: No Has patient had a PCN reaction causing severe rash involving mucus membranes or skin necrosis: No Has patient had a PCN reaction that required hospitalization: No Has patient had a PCN reaction occurring within the last 10 years: No If all of the above answers are "NO", then may proceed with Cephalosporin use.   . Tavist Nd [Loratadine] Rash    Medications Prior to Admission  Medication Sig Dispense Refill  . acetaminophen (TYLENOL) 500 MG tablet Take 1,000 mg by mouth 2 (two) times daily.    Marland Kitchen amLODipine (NORVASC) 5 MG tablet Take 5 mg by mouth daily.    . APPLE CIDER VINEGAR PO Take 1 capsule by mouth daily.    Marland Kitchen Bioflavonoid Products (ESTER-C) TABS Take 1 tablet by mouth daily.    . cholecalciferol (VITAMIN D) 1000 units tablet Take 1,000 Units by mouth daily.    . Chromium 1000 MCG TABS Take 1,000 mcg by mouth daily.    . Garlic 1000 MG CAPS Take 3,000 mg by mouth daily.    Marland Kitchen L-Arginine 500 MG CAPS Take 500 mg by mouth daily.    Marland Kitchen  losartan-hydrochlorothiazide (HYZAAR) 100-25 MG tablet Take 1 tablet by mouth daily.  1  . Melatonin 5 MG TABS Take 5 mg by mouth at bedtime.    . Misc Natural Products (OSTEO BI-FLEX ADV JOINT SHIELD) TABS Take 1 tablet by mouth daily.    . Misc Natural Products (URINOZINC PLUS) TABS Take 1 tablet by mouth daily.    . Multiple Vitamin (MULTIVITAMIN WITH MINERALS) TABS tablet Take 1 tablet by mouth daily.    . Omega-3 Krill Oil 500 MG CAPS Take 500 mg by mouth daily.    . Plant Sterols and Stanols (CHOLEST OFF PO) Take 1 capsule by mouth daily.    Marland Kitchen Resveratrol 250 MG CAPS Take 250 mg by mouth daily.    . Turmeric Curcumin 500 MG CAPS Take 500 mg by mouth daily.    Marland Kitchen Ubiquinone (ULTRA COQ10 PO) Take 1 capsule by mouth daily.      No results found for this or any previous visit (from the past 48 hour(s)). No results found.  Review of Systems  Constitutional: Negative.   Musculoskeletal: Positive for back pain.  Neurological: Positive for focal weakness.  All other systems reviewed and are negative.   Blood pressure 135/82, pulse (!) 110, temperature 98.2 F (36.8 C), temperature source Oral, resp. rate 18, height 5\' 10"  (1.778 m), weight 89.8 kg (198  lb), SpO2 98 %. Physical Exam  Constitutional: He is oriented to person, place, and time. He appears well-developed.  HENT:  Head: Normocephalic.  Eyes: Pupils are equal, round, and reactive to light.  Neck: Normal range of motion.  Cardiovascular: Normal rate.   Respiratory: Effort normal.  GI: Soft.  Musculoskeletal: He exhibits tenderness.  Neurological: He is alert and oriented to person, place, and time.  EHL 4/5 right. Positive SLR right. No DVT. EHL 5-/5 left.  Skin: Skin is warm and dry.  Psychiatric: He has a normal mood and affect.    MRI GOC Severe stenosis L34 L45 with Grade I slip L5 S1.  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 Foot drop right Plan decompression  And lateral mass fusion possible at L5S1. Risks discussed.  Javier DockerBEANE,Mitsuko Luera C, MD 05/03/2017, 8:22 AM

## 2017-05-03 NOTE — Transfer of Care (Signed)
Immediate Anesthesia Transfer of Care Note  Patient: Marca AnconaRobert L Achord  Procedure(s) Performed: Procedure(s): Microlumbar decompression L4-5, L3-4,  L5-S1 (N/A)  Patient Location: PACU  Anesthesia Type:General  Level of Consciousness: awake, alert , oriented and patient cooperative  Airway & Oxygen Therapy: Patient Spontanous Breathing and Patient connected to face mask oxygen  Post-op Assessment: Report given to RN, Post -op Vital signs reviewed and stable and Patient moving all extremities  Post vital signs: Reviewed and stable  Last Vitals:  Vitals:   05/03/17 0710  BP: 135/82  Pulse: (!) 110  Resp: 18  Temp: 36.8 C    Last Pain:  Vitals:   05/03/17 0710  TempSrc: Oral      Patients Stated Pain Goal: 3 (05/03/17 0706)  Complications: No apparent anesthesia complications

## 2017-05-03 NOTE — Progress Notes (Signed)
Pt has CPAP unit from home and wishes to utilize tonight.  RT inspected machine for frayed cords or damages, none were found.  Pt's machine is working properly at this time.  RT to monitor and assess as needed.

## 2017-05-03 NOTE — Anesthesia Postprocedure Evaluation (Addendum)
Anesthesia Post Note  Patient: Joshua AnconaRobert L Mack  Procedure(s) Performed: Procedure(s) (LRB): Microlumbar decompression L4-5, L3-4,  L5-S1 WITH LATERAL AUTOGRAFT FUSION (N/A)  Patient location during evaluation: PACU Anesthesia Type: General Level of consciousness: awake Pain management: pain level controlled Vital Signs Assessment: post-procedure vital signs reviewed and stable Respiratory status: spontaneous breathing Cardiovascular status: tachycardic Postop Assessment: no signs of nausea or vomiting and adequate PO intake Anesthetic complications: no        Last Vitals:  Vitals:   05/03/17 1315 05/03/17 1317  BP: 126/78   Pulse: (!) 109 (!) 106  Resp: 11 10  Temp:      Last Pain:  Vitals:   05/03/17 1300  TempSrc:   PainSc: 1    Pain Goal: Patients Stated Pain Goal: 3 (05/03/17 0706)               Bode Pieper JR,JOHN Susann GivensFRANKLIN

## 2017-05-03 NOTE — Interval H&P Note (Signed)
History and Physical Interval Note:  05/03/2017 8:29 AM  Joshua Mack  has presented today for surgery, with the diagnosis of Stenosis L3-4, L4-5  The various methods of treatment have been discussed with the patient and family. After consideration of risks, benefits and other options for treatment, the patient has consented to  Procedure(s): Microlumbar decompression L4-5, L3-4, possible L5-S1 (N/A) as a surgical intervention .  The patient's history has been reviewed, patient examined, no change in status, stable for surgery.  I have reviewed the patient's chart and labs.  Questions were answered to the patient's satisfaction.     [name] C

## 2017-05-03 NOTE — Anesthesia Procedure Notes (Signed)
Procedure Name: Intubation Date/Time: 05/03/2017 8:46 AM Performed by: Carleene Cooper A Pre-anesthesia Checklist: Patient identified, Emergency Drugs available, Suction available, Patient being monitored and Timeout performed Patient Re-evaluated:Patient Re-evaluated prior to inductionOxygen Delivery Method: Circle system utilized Preoxygenation: Pre-oxygenation with 100% oxygen Intubation Type: IV induction Ventilation: Mask ventilation without difficulty and Oral airway inserted - appropriate to patient size Laryngoscope Size: Glidescope and 3 Grade View: Grade I Tube type: Oral Tube size: 7.5 mm Number of attempts: 2 Airway Equipment and Method: Video-laryngoscopy Placement Confirmation: ETT inserted through vocal cords under direct vision,  positive ETCO2 and breath sounds checked- equal and bilateral Secured at: 21 cm Tube secured with: Tape Dental Injury: Teeth and Oropharynx as per pre-operative assessment  Difficulty Due To: Difficulty was anticipated, Difficult Airway- due to limited oral opening and Difficult Airway- due to reduced neck mobility Comments: PreO2. Smooth IV induction. Easy mask with oral airway. DL X 1 with MAC4. Small mouth opening noted. Blade removed. Masked. VSS. DL x 1 with MAC 3 Glidescope. Grade 1 view. + ETCO2. BBS=. ATOI. ETT secured at 21 cm at the lip.

## 2017-05-03 NOTE — Progress Notes (Addendum)
PHARMACIST - PHYSICIAN ORDER COMMUNICATION  CONCERNING: P&T Medication Policy on Herbal Medications  DESCRIPTION:  This patient's order for:  Melatonin, Urinozinc Plus and Resveratrol have been noted.  This product(s) is classified as an "herbal" or natural product. Due to a lack of definitive safety studies or FDA approval, nonstandard manufacturing practices, plus the potential risk of unknown drug-drug interactions while on inpatient medications, the Pharmacy and Therapeutics Committee does not permit the use of "herbal" or natural products of this type within [facility].   ACTION TAKEN: The pharmacy department is unable to verify this order at this time and your patient has been informed of this safety policy. Please reevaluate patient's clinical condition at discharge and address if the herbal or natural product(s) should be resumed at that time.  Loralee PacasErin Sonji Starkes, PharmD, BCPS Pharmacy: [phone] 05/03/2017 2:50 PM

## 2017-05-03 NOTE — H&P (View-Only) (Signed)
Joshua Mack is an 76 y.o. male.   Chief Complaint: back and RLE pain HPI: The patient is a 76 year old male who presents with back pain. The patient is here today in referral from Dr. Ramos. The patient reports low back symptoms including pain which began year(s) ago without any known injury. The pain radiates to the right thigh, right lower leg and right foot. The patient describes the pain as sharp. The patient describes the severity of their symptoms as 9 / 10. Symptoms are exacerbated by standing (and walking), while symptoms are not exacerbated by sitting. Current treatment includes nonsteroidal anti-inflammatory drugs (Ibuprofen). Prior to being seen today the patient was previously evaluated by Dr. Ramos. Past evaluation has included x-ray of the lumbar spine and MRI of the lumbar spine. Past treatment has included nonsteroidal anti-inflammatory drugs, epidural injections (L5-s1 right), physical therapy and SNRB :5-S1.  Joshua Mack is here from a referral from Dr. Ramos for surgical consultation. The patient reports a long history of back pain and now leg pain predominantly in the right lower extremity. He is limping. He is having numbness and weakness down into the legs. It has been progressive over the past years. Dr. Ramos has evaluated him and treated him with analgesics, activity modification, and has had him undergo epidural steroid injections. He had lumbar epidural at L5-S1 on the right. He has been treated for also knee pain in the past.  He has been doing essentially home exercise program, physical therapy, this seems to aggravate it, especially with extension.  REVIEW OF SYSTEMS Review of systems is negative for fevers, chest pain, shortness of breath, unexplained recent weight loss, loss of bowel or bladder function, burning with urination, joint swelling, rashes, weakness or numbness, difficulty with balance, easy bruising, excessive thirst or frequent urination.  He is here with  his wife.  He also reports that he still works. If he sits, it relieves his pain. If he stands, it aggravates it. He uses a grocery cart when he goes to the store.  Past Medical History:  Diagnosis Date  . High cholesterol   . Hypertension     No past surgical history on file.  No family history on file. Social History:  reports that he has never smoked. He does not have any smokeless tobacco history on file. He reports that he does not drink alcohol or use drugs.  Allergies:  Allergies  Allergen Reactions  . Amoxicillin   . Tavist Nd [Loratadine]      (Not in a hospital admission)  No results found for this or any previous visit (from the past 48 hour(s)). No results found.  Review of Systems  Constitutional: Negative.   HENT: Negative.   Eyes: Negative.   Respiratory: Negative.   Cardiovascular: Negative.   Gastrointestinal: Negative.   Genitourinary: Negative.   Musculoskeletal: Positive for back pain.  Skin: Negative.   Neurological: Positive for sensory change and focal weakness.  Psychiatric/Behavioral: Negative.     There were no vitals taken for this visit. Physical Exam  Constitutional: He is oriented to person, place, and time. He appears well-developed.  HENT:  Head: Normocephalic.  Eyes: Pupils are equal, round, and reactive to light.  Neck: Normal range of motion.  Cardiovascular: Normal rate.   Respiratory: Effort normal.  GI: Soft.  Musculoskeletal:  On exam, a healthy male, in moderate distress. Mood and affect is appropriate. He is tender in the lumbosacral junction. He has pain with extension, relieved with   forward flexion. Straight leg raise on the right, buttock, thigh, and calf pain and negative on the left. EHL and dorsiflexion on the left is 4/5 compared to the right. He also has decreased plantar flexion. Altered sensation in the L5 dermatome. Slightly weak quad on the right as well at 4+/5.  Lumbar spine exam reveals no evidence of soft  tissue swelling, deformity or skin ecchymosis. No flank pain with percussion. The abdomen is soft and nontender. Nontender over the trochanters. No cellulitis or lymphadenopathy.  Motor is 5/5 including tibialis anterior and hamstrings. Patient is normoreflexic. There is no Babinski or clonus. Patient has good distal pulses. No DVT. No pain and normal range of motion without instability of the hips, knees and ankles.  Painless range of motion of the cervical spine is noted.  Neurological: He is alert and oriented to person, place, and time.    Three-view x-rays, AP, lateral, flexion and extension demonstrates a grade 1 listhesis at 5-1 with end-stage degeneration without instability in flexion and extension. He has a transitional anatomy. He has severe spinal stenosis and congenital stenosis and a disc protrusion at L4-5 and extending into 3-4, moderate to severe on the right. Also encroaching on the left. At 5-1, there is small central disc protrusion. There is moderate foraminal narrowing.  A renal cyst is noted on the left.  Assessment/Plan 1. 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 2. Grade 1 isthmic spondylolisthesis at 5-1, end stage, bone-on-bone without instability in flexion and extension. Moderate neural foraminal narrowing at L5. 3. Moderate stenosis at 2-3. 4. Early foot drop with myotomal weakness, dermatomal dysesthesia on the right despite rest.  Extensive discussion concerning current pathology, relevant anatomy and treatment options; 25 minutes dedicated to this discussion, reviewing his pathology, relevant anatomy. I do feel he has symptomatic spinal stenosis. His predominant lesion is at L4-5 and L3-4. Given minimal back pain, predominantly claudication pain and radicular pain, I feel a microlumbar decompression at 3-4 and 4-5 would be appropriate. I do not feel a concomitant fusion would be would be required, as there is no instability at 5-1. There is  no stenosis at 5-1. 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 I do not feel the additional risk associated with a distraction or body effusion would add to a favorable outcome. I had an extensive discussion of the risks and benefits of the lumbar decompression with the patient including bleeding, infection, damage to neurovascular structures, epidural fibrosis, CSF leak requiring repair. We also discussed increase in pain, adjacent segment disease, recurrent disc herniation, need for future surgery including repeat decompression and/or fusion. We also discussed risks of postoperative hematoma, paralysis, anesthetic complications including DVT, PE, death, cardiopulmonary dysfunction. In addition, the perioperative and postoperative courses were discussed in detail including the rehabilitative time and return to functional activity and work. I provided the patient with an illustrated handout and utilized the appropriate surgical models.  We discussed time in the hospital, recovery, he is still working. He wants to return to work as soon as possible, though I indicated six weeks without lifting or bending, early ambulation. We indicated the severity of the stenosis and possibility of a dural rent. We will obtain preoperative clearance. I would like to re-examine him in a week, particularly on the right, if he is having increasing pain and/or numbness and weakness, we discussed urgency in terms of the decompression if this has been there, which he has described for months, then   certainly we would consider a decompression at an earlier date. I would not recommend continued observation or additional injections given the deficit and the partial foot drop at this point. I did discuss the goal of the decompression is to prevent further deficit. I do not feel he will need a concomitant fusion again. I discussed this with his wife. We will get preop clearance by Dr. Harris, and see him back in  a week for a repeat examination.  I appreciate the kind referral by Dr. Ramos. In the interim, we discussed Williams flexion program, avoid extension, favor flexion, etc., utilization of a cane given his antalgic gait and partial foot drop or utilization of the boot.  Plan microlumbar decompression L3-4, L4-5, possible L5-S1  Ita Fritzsche M., PA-C for Dr. Beane 04/07/2017, 11:50 AM   

## 2017-05-03 NOTE — Interval H&P Note (Signed)
History and Physical Interval Note:  05/03/2017 8:28 AM  Joshua Mack  has presented today for surgery, with the diagnosis of Stenosis L3-4, L4-5  The various methods of treatment have been discussed with the patient and family. After consideration of risks, benefits and other options for treatment, the patient has consented to  Procedure(s): Microlumbar decompression L4-5, L3-4, possible L5-S1 (N/A) as a surgical intervention .  The patient's history has been reviewed, patient examined, no change in status, stable for surgery.  I have reviewed the patient's chart and labs.  Questions were answered to the patient's satisfaction.   Possible lateral mass fusion at L5S1 with autologous bone.  [name]

## 2017-05-03 NOTE — Brief Op Note (Signed)
05/03/2017  12:03 PM  PATIENT:  Joshua Mack  76 y.o. male  PRE-OPERATIVE DIAGNOSIS:  Stenosis L3-4, L4-5  POST-OPERATIVE DIAGNOSIS:  Stenosis L3-4, L4-5  PROCEDURE:  Procedure(s): Microlumbar decompression L4-5, L3-4, possible L5-S1 (N/A)  SURGEON:  Surgeon(s) and Role:    Jene Every* Aalivia Mcgraw, MD - Primary  PHYSICIAN ASSISTANT:   ASSISTANTS: Skip MayerBlair Joshua Mack   ANESTHESIA:   general  EBL:  Total I/O In: 1000 [I.V.:1000] Out: 550 [Urine:350; Blood:200]  BLOOD ADMINISTERED:none  DRAINS: none   LOCAL MEDICATIONS USED:  MARCAINE     SPECIMEN:  No Specimen  DISPOSITION OF SPECIMEN:  N/A  COUNTS:  YES  TOURNIQUET:  * No tourniquets in log *  DICTATION: .Other Dictation: Dictation Number [id number]  PLAN OF CARE: Admit to inpatient   PATIENT DISPOSITION:  PACU - hemodynamically stable.   Delay start of Pharmacological VTE agent (>24hrs) due to surgical blood loss or risk of bleeding: yes

## 2017-05-04 ENCOUNTER — Encounter (HOSPITAL_COMMUNITY): Payer: Self-pay | Admitting: Specialist

## 2017-05-04 LAB — BASIC METABOLIC PANEL
Anion gap: 10 (ref 5–15)
BUN: 10 mg/dL (ref 6–20)
CO2: 25 mmol/L (ref 22–32)
Calcium: 8.8 mg/dL — ABNORMAL LOW (ref 8.9–10.3)
Chloride: 96 mmol/L — ABNORMAL LOW (ref 101–111)
Creatinine, Ser: 1 mg/dL (ref 0.61–1.24)
GFR calc Af Amer: >60 mL/min (ref >60)
GFR calc non Af Amer: >60 mL/min (ref >60)
Glucose, Bld: 129 mg/dL — ABNORMAL HIGH (ref 65–99)
Potassium: 4 mmol/L (ref 3.5–5.1)
Sodium: 131 mmol/L — ABNORMAL LOW (ref 135–145)

## 2017-05-04 LAB — CBC
HCT: 33.8 % — ABNORMAL LOW (ref 39.0–52.0)
Hemoglobin: 11.8 g/dL — ABNORMAL LOW (ref 13.0–17.0)
MCH: 30.6 pg (ref 26.0–34.0)
MCHC: 34.9 g/dL (ref 30.0–36.0)
MCV: 87.8 fL (ref 78.0–100.0)
Platelets: 326 10*3/uL (ref 150–400)
RBC: 3.85 MIL/uL — ABNORMAL LOW (ref 4.22–5.81)
RDW: 12.6 % (ref 11.5–15.5)
WBC: 15.6 10*3/uL — ABNORMAL HIGH (ref 4.0–10.5)

## 2017-05-04 NOTE — Progress Notes (Signed)
    Durable Medical Equipment        Start     Ordered   05/04/17 1217  For home use only DME 3 n 1  Once     05/04/17 1216    [phone]

## 2017-05-04 NOTE — Progress Notes (Signed)
Pt resting well at end of shift. Pt stated he had a lot of pain today, more than yesterday. He also had nausea and required medication. Pt given iv pain medication this evening along with ice on incision and reported he felt much better. Family at bedside .

## 2017-05-04 NOTE — Evaluation (Signed)
Occupational Therapy Evaluation Patient Details Name: Joshua Mack MRN: [id number] DOB: [birth year] Today's Date: 05/04/2017    History of Present Illness Pt s/p L3-4, L4-5 microlumbar decompression    Clinical Impression   This 76 y/o M presents with the above. At baseline Pt reports he is independent with ADLs and functional mobility. Pt currently requires MinGuard assist for functional mobility and MinA for LB ADLs. Educated Pt on AE and compensatory techniques for completing ADLs while adhering to back precautions and to increase safety. Pt will have assist from spouse and family upon return home with ADLs PRN. Questions answered throughout session. Pt reports feeling comfortable completing ADLs upon return home and with family assist. No further OT needs identified at this time. Will sign off.     Follow Up Recommendations  No OT follow up;Supervision - Intermittent    Equipment Recommendations  3 in 1 bedside commode           Precautions / Restrictions Precautions Precautions: Back Precaution Booklet Issued: Yes (comment) Precaution Comments: Handout issued by PT; Pt able to verbalize precautions, reviewed with Pt throughout session Restrictions Weight Bearing Restrictions: No      Mobility Bed Mobility Overal bed mobility: Needs Assistance Bed Mobility:      Supine to sit:      General bed mobility comments: OOB with PT using log roll technique; Pt reports feeling comfortable completing   Transfers Overall transfer level: Needs assistance Equipment used: None Transfers: Sit to/from Stand Sit to Stand: Min guard         General transfer comment: cues for position during sit to stand to adhere to back precautions with good follow through                                                ADL either performed or assessed with clinical judgement   ADL Overall ADL's : Needs assistance/impaired Eating/Feeding: Independent;Sitting    Grooming: Oral care;Min guard;Standing   Upper Body Bathing: Set up;Sitting   Lower Body Bathing: Min guard;Sit to/from stand   Upper Body Dressing : Set up;Sitting   Lower Body Dressing: Minimal assistance;Sit to/from stand Lower Body Dressing Details (indicate cue type and reason): Pt able to demonstrate figure 4 technique to complete LB dressing tasks  Toilet Transfer: Min guard;Ambulation;BSC Toilet Transfer Details (indicate cue type and reason): BSC over toilet  Toileting- Clothing Manipulation and Hygiene: Min guard;Sit to/from Nurse, children'sstand     Tub/Shower Transfer Details (indicate cue type and reason): Educated Pt on safe transfer technique for stepping into tub; educated on use of 3:1 as shower seat during task completion  Functional mobility during ADLs: Min guard General ADL Comments: Educated on AE and compensatory techniques for completing ADLs while adhering to back precautions                          Pertinent Vitals/Pain Pain Assessment: Faces Pain Score: 5  Faces Pain Scale: Hurts a little bit Pain Location: back Pain Descriptors / Indicators: Aching;Sore Pain Intervention(s): Limited activity within patient's tolerance;Monitored during session;Premedicated before session     Hand Dominance     Extremity/Trunk Assessment Upper Extremity Assessment Upper Extremity Assessment: Overall WFL for tasks assessed   Lower Extremity Assessment Lower Extremity Assessment: Overall WFL for tasks assessed       Communication  Communication Communication: No difficulties   Cognition Arousal/Alertness: Awake/alert Behavior During Therapy: WFL for tasks assessed/performed Overall Cognitive Status: Within Functional Limits for tasks assessed                                     General Comments                  Home Living Family/patient expects to be discharged to:: Private residence Living Arrangements: Spouse/significant other Available  Help at Discharge: Family Type of Home: House Home Access: Stairs to enter Secretary/administrator of Steps: 5 Entrance Stairs-Rails: Right Home Layout: One level     Bathroom Shower/Tub: Chief Strategy Officer: Standard     Home Equipment: Medical laboratory scientific officer - single point;Crutches          Prior Functioning/Environment Level of Independence: Independent                 OT Problem List: Decreased activity tolerance;Decreased strength;Decreased knowledge of precautions      OT Treatment/Interventions:      OT Goals(Current goals can be found in the care plan section) Acute Rehab OT Goals Patient Stated Goal: Regain IND and get back to work OT Goal Formulation: With patient                                 AM-PAC PT "6 Clicks" Daily Activity     Outcome Measure Help from another person eating meals?: None Help from another person taking care of personal grooming?: A Little Help from another person toileting, which includes using toliet, bedpan, or urinal?: A Little Help from another person bathing (including washing, rinsing, drying)?: A Little Help from another person to put on and taking off regular upper body clothing?: A Little Help from another person to put on and taking off regular lower body clothing?: A Little 6 Click Score: 19   End of Session Equipment Utilized During Treatment: Gait belt Nurse Communication: Other (comment) (DME needs )  Activity Tolerance: Patient tolerated treatment well Patient left: in chair;with call bell/phone within reach  OT Visit Diagnosis: Muscle weakness (generalized) (M62.81)                Time: 1610-9604 OT Time Calculation (min): 30 min Charges:  OT General Charges $OT Visit: 1 Procedure OT Evaluation $OT Eval Low Complexity: 1 Procedure OT Treatments $Self Care/Home Management : 8-22 mins G-Codes:     Joshua Mack, OT Pager [phone] 05/04/2017  Joshua Mack 05/04/2017, 1:53 PM

## 2017-05-04 NOTE — Op Note (Signed)
NAME:  OSIE, AMPARO                     ACCOUNT NO.:  MEDICAL RECORD NO.:  [id number]  LOCATION:                                 FACILITY:  PHYSICIAN:  Jene Every, M.D.         DATE OF BIRTH:  DATE OF PROCEDURE:  05/03/2017 DATE OF DISCHARGE:                              OPERATIVE REPORT   PREOPERATIVE DIAGNOSES: 1. Spinal stenosis, L4-5, L3-4, and L5-S1. 2. Grade 1 spondylolisthesis, L5-S1.  POSTOPERATIVE DIAGNOSES: 1. Spinal stenosis, L4-5, L3-4, and L5-S1. 2. Grade 1 spondylolisthesis, L5-S1.  PROCEDURE PERFORMED: 1. Microlumbar decompression at L3-4, L4-5, L5-S1 with laminectomies     of L4 and L5. 2. Bilateral lateral recess decompression with foraminotomies of L4,     L5, S1. 3. Lateral mass fusion L5-S1 with autologous bone graft harvested from     the spinous processes.  ANESTHESIA:  General.  ASSISTANT:  Skip Mayer, PA.  HISTORY:  This is a 76 year old male with a right footdrop and neurogenic claudication secondary to severe spinal stenosis, predominantly at L4-5.  He had a grade 1 slip at L5-S1 without instability on flexion, extension, no back pain.  Stenosis extended up into L3-4.  He had some quad weakness and EHL weakness.  We discussed decompression L4-5 and laminectomy extending up into L3-4 and decompressing the lateral recesses at L3-4 and possible L5-S1 given his L5 footdrop on the right and symptomatology.  We felt if necessary that we could provide or utilize autologous harvested bone from the decompression and placed it into the lateral recess for aided stability given the listhesis.  Again, it was not moving in flexion, extension, was end stage bone on bone and was not having back pain.  We discussed risks and benefits including bleeding, infection, damage to neurovascular structures, no change in symptoms or symptoms of DVT/PE, anesthetic complications, etc.  TECHNIQUE:  With the patient in supine position, after induction of adequate  general anesthesia, 2 g of Kefzol, he was placed prone on the Coleman frame.  All bony prominences were well padded.  Lumbar region was prepped and draped in usual sterile fashion.  Two 18-gauge spinal needles were utilized to localize L4-5 interspace, confirmed with x-ray. Incision was made from spinous process of L3 to below L5.  Subcutaneous tissue was dissected.  Electrocautery utilized to achieve hemostasis. Dorsolumbar fascia divided in line with the skin incision.  Marcaine 0.25% with epinephrine was infiltrated in the perifascial tissues for hemostasis.  We elevated the paraspinous musculature at L3-4, L4-5, and L5-S1.  McCulloch retractor was placed.  Operating microscope was draped and brought on the surgical field.  I skeletonized spinous processes of L5, L4, and of L3.  I used a Leksell rongeur to remove the spinous process of L5, L4, and partial of L3.  Due to the slip, there was a Gill fragment at L5 was noted and fairly inclined.  This started at L4-5 first.  I then utilized a micro curette to detach the ligamentum flavum from the cephalad edge of L5, the caudad edge of L4 utilizing 2 mm Kerrison detaching the ligamentum flavum. Severe stenosis was noted at L4-5.  We  continued the laminectomy at L4. Neuro patty was placed beneath ligamentum flavum.  Following resection of the 4 neural arch, then from both sides of the operating room table, we decompressed the lateral recess, the medial border of the pedicle. There was facet and ligamentum flavum hypertrophy compressing the lateral recesses bilaterally fairly severely.  Extending into L3-4, there was severe stenosis in the lateral recess as well.  We decompressed both lateral recesses, performed foraminotomies of L4.  No disk herniation.  Bipolar electrocautery was utilized to achieve hemostasis as was thrombin-soaked Gelfoam and bone wax.  Following decompression at L3-4, I reevaluated a stenosis at L5 and felt  fairly stenotic right at the superior edge of the Gill fragment, therefore felt that removal of the Gill fragment would further aid in this decompression and exploration of the L5 root.  At L5-S1, then I identified the interspace at L5-S1.  Utilized a microcurette, detached ligamentum flavum from the caudad edge of L5 using 2 mm Kerrison centrally, then to begin the laminectomy and excision of the Gill fragment.  We placed neuro patties beneath the ligamentum flavum and then at cephalad edge, we detached the ligamentum flavum with a micro curette, placed a neuro patty and Woodson retractors beneath this to remove the entire Gill fragment and then decompress both lateral recesses to the medial border of the pedicle performing foraminotomies bilaterally at S1 and L5.  L5 in particular, there was granular hypertrophic tissue within the foramen of L5.  Neural elements were visualized and protected at all times.  We performed meticulous foraminotomies decompressing the L5 root.  There were no disk herniations noted at L4-5 or at L5-S1.  This was done on both sides of the operating room table.  Good restoration of thecal sac and the nerve roots.  A neural probe passed freely route of the foramen to the S1, L5, and L4.  Following the decompression, we obtained a confirmatory radiograph with a marker at L3 down to S1.  Copious irrigation with antibiotic irrigation.  Meticulous hemostasis with thrombin-soaked Gelfoam, bone wax, etc.  Again, full restoration of the thecal sac. There was some attenuation noted at L4-5, but no CSF leakage or bleb was noted.  Thrombin-soaked Gelfoam was placed over the laminotomy defect. Again, given the removal of the Gill fragment of L5, I skeletonized and morselized spinous process using the bone graft that was obtained from the resection.  I then created a plane just over the facet of L5 and identifying the ala of L5 down to the TT of L5.  Placed a curette  over the ala and lateral aspect of the facet gently decorticating that. Then, I took bone graft with brushings and spoon and delivered it into this pocket down into the ala and the lateral aspect of L5, again just for added stability in the future, again was not moving at this segment, and again though would aid in the osseous stabilization of this segment. Next, we copiously irrigated the wound.  No evidence of active bleeding. We removed the retractors.  No CSF leakage.  We closed the dorsolumbar fascia with 1 Vicryl interrupted and then a running STRATAFIX with a small aperture cephalad and caudad to allow for any drainage, subcu with 2-0, skin with staples.  Sterile dressing applied.  Placed supine on the hospital bed, extubated without difficulty, and transported to the recovery room in satisfactory condition.  The patient tolerated the procedure well.  No complications.  Assistant, Skip Mayer, PA, was used throughout the  case for patient positioning, gentle intermittent neural traction, suction, closure and positioning.  BLOOD LOSS:  200 mL.     Jene EveryJeffrey Jaramie Bastos, M.D.    Cordelia PenJB/MEDQ  D:  05/03/2017  T:  05/04/2017  Job:  [id number]

## 2017-05-04 NOTE — Evaluation (Signed)
Physical Therapy Evaluation Patient Details Name: Marca AnconaRobert L Wickham MRN: [id number] DOB: 18-Aug-1941 Today's Date: 05/04/2017   History of Present Illness  Pt s/p L3-4, L4-5 microlumbar decompression   Clinical Impression  Pt s/p back surgery and presents with functional mobility limitations 2* post op pain and back precautions.  Pt should progress to dc home with assist of family.    Follow Up Recommendations No PT follow up    Equipment Recommendations  None recommended by PT    Recommendations for Other Services OT consult     Precautions / Restrictions Precautions Precautions: Back Precaution Booklet Issued: Yes (comment) Precaution Comments: Back precautions reviewed x 3 Restrictions Weight Bearing Restrictions: No      Mobility  Bed Mobility Overal bed mobility: Needs Assistance Bed Mobility: Supine to Sit     Supine to sit: Min guard;Supervision     General bed mobility comments: cues for correct log roll technique and transition to sitting  Transfers Overall transfer level: Needs assistance Equipment used: Rolling walker (2 wheeled) Transfers: Sit to/from Stand Sit to Stand: Min guard;Supervision         General transfer comment: cues for transition position, use of UEs to self assist and adherence to back precautions  Ambulation/Gait Ambulation/Gait assistance: Min guard;Supervision Ambulation Distance (Feet): 800 Feet Assistive device: None;Rolling walker (2 wheeled) Gait Pattern/deviations: Step-through pattern;Shuffle Gait velocity: decr Gait velocity interpretation: Below normal speed for age/gender General Gait Details: 200' with RW and additional 600' sans AD; pt demonstrating min instability and no LOB.  Pt resistant to RW for home use  Stairs Stairs: Yes Stairs assistance: Min guard Stair Management: One rail Left;Step to pattern;Forwards Number of Stairs: 4 General stair comments: min cues for sequence  Wheelchair Mobility    Modified  Rankin (Stroke Patients Only)       Balance                                             Pertinent Vitals/Pain Pain Assessment: 0-10 Pain Score: 5  Pain Location: back Pain Descriptors / Indicators: Aching;Sore Pain Intervention(s): Limited activity within patient's tolerance;Monitored during session;Premedicated before session    Home Living Family/patient expects to be discharged to:: Private residence Living Arrangements: Spouse/significant other Available Help at Discharge: Family Type of Home: House Home Access: Stairs to enter Entrance Stairs-Rails: Right Entrance Stairs-Number of Steps: 5 Home Layout: One level Home Equipment: Cane - single point;Crutches      Prior Function Level of Independence: Independent               Hand Dominance        Extremity/Trunk Assessment   Upper Extremity Assessment Upper Extremity Assessment: Overall WFL for tasks assessed    Lower Extremity Assessment Lower Extremity Assessment: Overall WFL for tasks assessed       Communication   Communication: No difficulties  Cognition Arousal/Alertness: Awake/alert Behavior During Therapy: WFL for tasks assessed/performed Overall Cognitive Status: Within Functional Limits for tasks assessed                                        General Comments      Exercises     Assessment/Plan    PT Assessment Patient needs continued PT services  PT Problem List Decreased strength;Decreased range  of motion;Decreased activity tolerance;Decreased mobility;Decreased knowledge of use of DME;Pain;Decreased knowledge of precautions       PT Treatment Interventions DME instruction;Gait training;Stair training;Functional mobility training;Therapeutic activities;Therapeutic exercise;Patient/family education    PT Goals (Current goals can be found in the Care Plan section)  Acute Rehab PT Goals Patient Stated Goal: Regain IND and get back to work PT  Goal Formulation: With patient Time For Goal Achievement: 05/06/17 Potential to Achieve Goals: Good    Frequency Min 6X/week   Barriers to discharge        Co-evaluation               AM-PAC PT "6 Clicks" Daily Activity  Outcome Measure Difficulty turning over in bed (including adjusting bedclothes, sheets and blankets)?: A Little Difficulty moving from lying on back to sitting on the side of the bed? : A Little Difficulty sitting down on and standing up from a chair with arms (e.g., wheelchair, bedside commode, etc,.)?: A Little Help needed moving to and from a bed to chair (including a wheelchair)?: A Little Help needed walking in hospital room?: A Little Help needed climbing 3-5 steps with a railing? : A Little 6 Click Score: 18    End of Session   Activity Tolerance: Patient tolerated treatment well Patient left: in chair;with call bell/phone within reach Nurse Communication: Mobility status PT Visit Diagnosis: Difficulty in walking, not elsewhere classified (R26.2)    Time: 1610-9604 PT Time Calculation (min) (ACUTE ONLY): 37 min   Charges:   PT Evaluation $PT Eval Low Complexity: 1 Procedure PT Treatments $Gait Training: 8-22 mins   PT G Codes:   PT G-Codes **NOT FOR INPATIENT CLASS** Functional Assessment Tool Used: Clinical judgement Functional Limitation: Mobility: Walking and moving around Mobility: Walking and Moving Around Current Status (V4098): At least 1 percent but less than 20 percent impaired, limited or restricted Mobility: Walking and Moving Around Goal Status [phone]): At least 1 percent but less than 20 percent impaired, limited or restricted    Pg [phone]    Nicey Krah 05/04/2017, 12:38 PM

## 2017-05-04 NOTE — Progress Notes (Signed)
Subjective: Doing great. No pain   Objective: Vital signs in last 24 hours: Temp:  [98 F (36.7 Mack)-99.2 F (37.3 Mack)] 99.2 F (37.3 Mack) (05/17 0526) Pulse Rate:  [79-121] 80 (05/17 0526) Resp:  [10-18] 15 (05/17 0526) BP: (125-164)/(66-153) 146/75 (05/17 0526) SpO2:  [94 %-100 %] 99 % (05/17 0526)  Intake/Output from previous day: 05/16 0701 - 05/17 0700 In: 3848.3 [P.O.:810; I.V.:2783.3; IV Piggyback:255] Out: 2850 [Urine:2650; Blood:200] Intake/Output this shift: No intake/output data recorded.   Recent Labs  05/04/17 0406  HGB 11.8*    Recent Labs  05/04/17 0406  WBC 15.6*  RBC 3.85*  HCT 33.8*  PLT 326    Recent Labs  05/04/17 0406  NA 131*  K 4.0  CL 96*  CO2 25  BUN 10  CREATININE 1.00  GLUCOSE 129*  CALCIUM 8.8*   No results for input(s): LABPT, INR in the last 72 hours.  Neurologically intact Neurovascular intact Sensation intact distally Intact pulses distally Incision: dressing Mack/D/I Compartment soft  Assessment/Plan: Doing well. Plan PT OOB dressing change. Instructions given. Possible D/Mack today.   Joshua Mack 05/04/2017, 7:12 AM

## 2017-05-05 ENCOUNTER — Inpatient Hospital Stay (HOSPITAL_COMMUNITY): Payer: BLUE CROSS/BLUE SHIELD

## 2017-05-05 LAB — BASIC METABOLIC PANEL
Anion gap: 9 (ref 5–15)
BUN: 11 mg/dL (ref 6–20)
CO2: 25 mmol/L (ref 22–32)
Calcium: 8 mg/dL — ABNORMAL LOW (ref 8.9–10.3)
Chloride: 92 mmol/L — ABNORMAL LOW (ref 101–111)
Creatinine, Ser: 0.81 mg/dL (ref 0.61–1.24)
GFR calc Af Amer: >60 mL/min (ref >60)
GFR calc non Af Amer: >60 mL/min (ref >60)
Glucose, Bld: 115 mg/dL — ABNORMAL HIGH (ref 65–99)
Potassium: 3.7 mmol/L (ref 3.5–5.1)
Sodium: 126 mmol/L — ABNORMAL LOW (ref 135–145)

## 2017-05-05 LAB — CBC WITH DIFFERENTIAL/PLATELET
Basophils Absolute: 0 10*3/uL (ref 0.0–0.1)
Basophils Relative: 0 %
Eosinophils Absolute: 0.1 10*3/uL (ref 0.0–0.7)
Eosinophils Relative: 1 %
HCT: 33.5 % — ABNORMAL LOW (ref 39.0–52.0)
Hemoglobin: 11.8 g/dL — ABNORMAL LOW (ref 13.0–17.0)
Lymphocytes Relative: 14 %
Lymphs Abs: 2 10*3/uL (ref 0.7–4.0)
MCH: 31 pg (ref 26.0–34.0)
MCHC: 35.2 g/dL (ref 30.0–36.0)
MCV: 87.9 fL (ref 78.0–100.0)
Monocytes Absolute: 1.6 10*3/uL — ABNORMAL HIGH (ref 0.1–1.0)
Monocytes Relative: 11 %
Neutro Abs: 10.6 10*3/uL — ABNORMAL HIGH (ref 1.7–7.7)
Neutrophils Relative %: 74 %
Platelets: 296 10*3/uL (ref 150–400)
RBC: 3.81 MIL/uL — ABNORMAL LOW (ref 4.22–5.81)
RDW: 12.8 % (ref 11.5–15.5)
WBC: 14.3 10*3/uL — ABNORMAL HIGH (ref 4.0–10.5)

## 2017-05-05 MED ORDER — METOCLOPRAMIDE HCL 5 MG/ML IJ SOLN
10.0000 mg | Freq: Four times a day (QID) | INTRAMUSCULAR | Status: DC
  Administered 2017-05-06 – 2017-05-07 (×4): 10 mg via INTRAVENOUS
  Filled 2017-05-05 (×8): qty 2

## 2017-05-05 MED ORDER — TAMSULOSIN HCL 0.4 MG PO CAPS
0.4000 mg | ORAL_CAPSULE | Freq: Every day | ORAL | Status: DC
  Administered 2017-05-05 – 2017-05-09 (×5): 0.4 mg via ORAL
  Filled 2017-05-05 (×5): qty 1

## 2017-05-05 MED ORDER — FINASTERIDE 5 MG PO TABS
5.0000 mg | ORAL_TABLET | Freq: Every day | ORAL | Status: DC
  Administered 2017-05-05 – 2017-05-09 (×5): 5 mg via ORAL
  Filled 2017-05-05 (×5): qty 1

## 2017-05-05 MED ORDER — FINASTERIDE 5 MG PO TABS: 5.0000 mg | ORAL_TABLET | Freq: Every day | ORAL | 3 refills | Status: AC

## 2017-05-05 MED ORDER — TAMSULOSIN HCL 0.4 MG PO CAPS: 0.4000 mg | ORAL_CAPSULE | Freq: Every day | ORAL | 3 refills | Status: DC

## 2017-05-05 NOTE — Progress Notes (Signed)
Patient unable to void, bladder scan performed with . Indwelling foley placed with of amber in return. Dr.Beane aware. Dr.Beane is calling and consulting Dr.Manny with Urology.

## 2017-05-05 NOTE — Progress Notes (Signed)
Physical Therapy Treatment Patient Details Name: Joshua Mack MRN: [id number] DOB: [birth year] Today's Date: 05/05/2017    History of Present Illness Pt s/p L3-4, L4-5 microlumbar decompression     PT Comments    Pt cooperative but limited by marked increase in pain (taking min meds 2* urinary retention).  Pt states corset helps a little with pain while attempting to mobilize but pt with markedly decreased activity tolerance vs last day.  Follow Up Recommendations        Equipment Recommendations  Rolling walker with 5" wheels (wife is checking to see if they have one available)    Recommendations for Other Services       Precautions / Restrictions Precautions Precautions: Back Precaution Comments: Reviewed back precautions.  Pt able to recall 2/4 without cues Restrictions Weight Bearing Restrictions: No    Mobility  Bed Mobility Overal bed mobility: Needs Assistance Bed Mobility: Supine to Sit;Sit to Supine     Supine to sit: Min guard;Supervision Sit to supine: Min assist   General bed mobility comments: cues for correct log roll technique and adherence to back precautions.  Min assist with LEs back into bed  Transfers Overall transfer level: Needs assistance Equipment used: Rolling walker (2 wheeled) Transfers: Sit to/from Stand Sit to Stand: Min assist         General transfer comment: cues for transition position use of UEs to self assist and adherence to back precautions  Ambulation/Gait Ambulation/Gait assistance: Min assist;Min guard Ambulation Distance (Feet): 45 Feet Assistive device: Rolling walker (2 wheeled) Gait Pattern/deviations: Step-through pattern;Shuffle;Decreased step length - left;Decreased step length - right Gait velocity: decr Gait velocity interpretation: Below normal speed for age/gender General Gait Details: cues for posture and position from RW   Stairs            Wheelchair Mobility    Modified Rankin (Stroke  Patients Only)       Balance                                            Cognition Arousal/Alertness: Awake/alert Behavior During Therapy: WFL for tasks assessed/performed Overall Cognitive Status: Within Functional Limits for tasks assessed                                        Exercises      General Comments        Pertinent Vitals/Pain Pain Assessment: 0-10 Pain Score: 8  Pain Location: back Pain Descriptors / Indicators: Aching;Sore Pain Intervention(s): Limited activity within patient's tolerance;Monitored during session;Patient requesting pain meds-RN notified    Home Living                      Prior Function            PT Goals (current goals can now be found in the care plan section) Acute Rehab PT Goals Patient Stated Goal: Regain IND and get back to work PT Goal Formulation: With patient Time For Goal Achievement: 05/06/17 Potential to Achieve Goals: Good Progress towards PT goals: Not progressing toward goals - comment (increased pain)    Frequency    Min 6X/week      PT Plan Current plan remains appropriate    Co-evaluation  AM-PAC PT "6 Clicks" Daily Activity  Outcome Measure  Difficulty turning over in bed (including adjusting bedclothes, sheets and blankets)?: A Little Difficulty moving from lying on back to sitting on the side of the bed? : A Little Difficulty sitting down on and standing up from a chair with arms (e.g., wheelchair, bedside commode, etc,.)?: A Lot Help needed moving to and from a bed to chair (including a wheelchair)?: A Little Help needed walking in hospital room?: A Little Help needed climbing 3-5 steps with a railing? : A Lot 6 Click Score: 16    End of Session Equipment Utilized During Treatment: Back brace Activity Tolerance: Patient limited by pain Patient left: in bed;with call bell/phone within reach Nurse Communication: Mobility status PT Visit  Diagnosis: Difficulty in walking, not elsewhere classified (R26.2)     Time: 1610-96041438-1502 PT Time Calculation (min) (ACUTE ONLY): 24 min  Charges:  $Gait Training: 8-22 mins                    G Codes:          Joshua Mack 05/05/2017, 5:31 PM

## 2017-05-05 NOTE — Progress Notes (Signed)
Spoke with Dr.Beane in regards to patients fever and inability to urinate. Dr.Beane ordered to bladder scan patient at 11:00 and if there was more than to place an indwelling foley and labs ordered. Will continue to monitor.

## 2017-05-05 NOTE — Progress Notes (Signed)
Patient's IV was painful, slightly red, and swollen during shift assessment, removed IV, pt declined new IV start. Pain difficult to manage with PO medication, pt later agreed to new IV. Pain much better controlled with IV medication. Warm compress applied to old IV site for comfort.   Pt voided very little during the night, denied discomfort or need to void. Attempted to void this AM with no success, bladder scan performed and revealed . In and out cath performed per protocol.

## 2017-05-05 NOTE — Progress Notes (Signed)
Pt using his home CPAP with nasal pillows.  Pt desaturating at times when Pt is holding his breath due to grimacing in pain.  O2 increased to 8Lpm and discussed with RN.

## 2017-05-05 NOTE — Progress Notes (Signed)
Subjective: 2 Days Post-Op Procedure(s) (LRB): Microlumbar decompression L4-5, L3-4,  L5-S1 WITH LATERAL AUTOGRAFT FUSION (N/A) Patient reports pain as 3 on 0-10 scale.    Objective: Vital signs in last 24 hours: Temp:  [98.7 F (37.1 Mack)-100 F (37.8 Mack)] 99.6 F (37.6 Mack) (05/18 1058) Pulse Rate:  [73-99] 99 (05/18 1058) Resp:  [15-16] 15 (05/18 1058) BP: (102-146)/(64-79) 146/74 (05/18 1058) SpO2:  [93 %-98 %] 93 % (05/18 1058)  Intake/Output from previous day: 05/17 0701 - 05/18 0700 In: 1400 [P.O.:900; I.V.:500] Out: 950 [Urine:950] Intake/Output this shift: Total I/O In: 120 [P.O.:120] Out: -    Recent Labs  05/04/17 0406 05/05/17 0915  HGB 11.8* 11.8*    Recent Labs  05/04/17 0406 05/05/17 0915  WBC 15.6* 14.3*  RBC 3.85* 3.81*  HCT 33.8* 33.5*  PLT 326 296    Recent Labs  05/04/17 0406 05/05/17 0915  NA 131* 126*  K 4.0 3.7  CL 96* 92*  CO2 25 25  BUN 10 11  CREATININE 1.00 0.81  GLUCOSE 129* 115*  CALCIUM 8.8* 8.0*   No results for input(s): LABPT, INR in the last 72 hours.  Neurologically intact Dorsiflexion/Plantar flexion intact Incision: dressing Mack/D/I and no drainage Compartment soft No more leg pain since bladder decompressed. Rectal good sensation and good tone Perineal sensation intact. Feels foley.  Assessment/Plan: 2 Days Post-Op Procedure(s) (LRB): Microlumbar decompression L4-5, L3-4,  L5-S1 WITH LATERAL AUTOGRAFT FUSION (N/A)  Bladder dysfunction. No evidence of cauda equina. Plan Urology consult. Foley. Also Low Na check tomorrow No MS changes. Advance diet Up with therapy Plan for discharge tomorrow  Joshua Mack 05/05/2017, 12:46 PM

## 2017-05-05 NOTE — Progress Notes (Signed)
Called on call PA in regards to patient oxygen dropping to 76% during routine vitals. Placed patient on his personal CPAP by Respiratory and oxygen level is 93% on CPAP. Patients abdomen is distended and patient is nauseated, she ordered IV reglan Q 6hours to promote BM. Chest x-ray ordered. Ordered to keep 02 above 90% at all times. Made PA aware of 100.4temp.

## 2017-05-05 NOTE — Consult Note (Signed)
Reason for Consult: Urinary Retention  Referring Physician: Erasmo Score MD  Joshua Mack is an 76 y.o. male.   HPI:   1- Post-op Urinary Retention - pt POD 2 s/p multilevel Joshua spine surgery with fail trial of void x 2 with PVR's >553m. Minimal baseline voiding complaints. Cr <1.2. No new LE deficits.   2 - Prostate Screening - No FHX prostate cancer. DRE 04/2017 70gm smooth.  3 - Bilateral Renal Cysts - 3.8 cm RLP cyst with 1.2 cm cyst nearby. Lt 8cm lower and 1.6cm mid by renal [place]. No hydro / mass effect. No large vascular nodules. Few thin septations only in several. No dedicated contrast axial imaging as of yet.   PMH sig for Spine Surgery, OSA/CPAP, HLD, HTN.   Today " LFritz Mack" is seen in consultation for above.   Past Medical History:  Diagnosis Date  . High cholesterol   . Hypertension   . Sleep apnea     Past Surgical History:  Procedure Laterality Date  . LUMBAR LAMINECTOMY/DECOMPRESSION MICRODISCECTOMY N/A 05/03/2017   Procedure: Microlumbar decompression L4-5, L3-4,  L5-S1 WITH LATERAL AUTOGRAFT FUSION;  Surgeon: BSusa Day MD;  Location: [facility];  Service: Orthopedics;  Laterality: N/A;    History reviewed. No pertinent family history.  Social History:  reports that he has never smoked. He has never used smokeless tobacco. He reports that he does not drink alcohol or use drugs.  Allergies:  Allergies  Allergen Reactions  . Fentanyl Other (See Comments)    Had a reaction during a colonoscopy in the past. He was told it required Benadryl.  . Midazolam Other (See Comments)    Had a reaction during a colonoscopy in the past. He was told it required Benadryl.  . Amoxicillin Rash    Has patient had a PCN reaction causing immediate rash, facial/tongue/throat swelling, SOB or lightheadedness with hypotension: No Has patient had a PCN reaction causing severe rash involving mucus membranes or skin necrosis: No Has patient had a PCN reaction that required  hospitalization: No Has patient had a PCN reaction occurring within the last 10 years: No If all of the above answers are "NO", then may proceed with Cephalosporin use.   . Tavist Nd [Loratadine] Rash    Medications: I have reviewed the patient's current medications.  Results for orders placed or performed during the hospital encounter of 05/03/17 (from the past 48 hour(s))  Basic Metabolic Panel     Status: Abnormal   Collection Time: 05/04/17  4:06 AM  Result Value Ref Range   Sodium 131 (Joshua) 135 - 145 mmol/Joshua   Potassium 4.0 3.5 - 5.1 mmol/Joshua   Chloride 96 (Joshua) 101 - 111 mmol/Joshua   CO2 25 22 - 32 mmol/Joshua   Glucose, Bld 129 (H) 65 - 99 mg/dL   BUN 10 6 - 20 mg/dL   Creatinine, Ser 1.00 0.61 - 1.24 mg/dL   Calcium 8.8 (Joshua) 8.9 - 10.3 mg/dL   GFR calc non Af Amer >60 >60 mL/min   GFR calc Af Amer >60 >60 mL/min    Comment: (NOTE) The eGFR has been calculated using the CKD EPI equation. This calculation has not been validated in all clinical situations. eGFR's persistently <60 mL/min signify possible Chronic Kidney Disease.    Anion gap 10 5 - 15  CBC     Status: Abnormal   Collection Time: 05/04/17  4:06 AM  Result Value Ref Range   WBC 15.6 (H) 4.0 - 10.5  K/uL   RBC 3.85 (Joshua) 4.22 - 5.81 MIL/uL   Hemoglobin 11.8 (Joshua) 13.0 - 17.0 g/dL   HCT 33.8 (Joshua) 39.0 - 52.0 %   MCV 87.8 78.0 - 100.0 fL   MCH 30.6 26.0 - 34.0 pg   MCHC 34.9 30.0 - 36.0 g/dL   RDW 12.6 11.5 - 15.5 %   Platelets 326 150 - 400 K/uL  CBC with Differential/Platelet     Status: Abnormal   Collection Time: 05/05/17  9:15 AM  Result Value Ref Range   WBC 14.3 (H) 4.0 - 10.5 K/uL    Comment: WHITE COUNT CONFIRMED ON SMEAR   RBC 3.81 (Joshua) 4.22 - 5.81 MIL/uL   Hemoglobin 11.8 (Joshua) 13.0 - 17.0 g/dL   HCT 33.5 (Joshua) 39.0 - 52.0 %   MCV 87.9 78.0 - 100.0 fL   MCH 31.0 26.0 - 34.0 pg   MCHC 35.2 30.0 - 36.0 g/dL   RDW 12.8 11.5 - 15.5 %   Platelets 296 150 - 400 K/uL    Comment: SPECIMEN CHECKED FOR CLOTS PLATELET COUNT  CONFIRMED BY SMEAR    Neutrophils Relative % 74 %   Lymphocytes Relative 14 %   Monocytes Relative 11 %   Eosinophils Relative 1 %   Basophils Relative 0 %   Neutro Abs 10.6 (H) 1.7 - 7.7 K/uL   Lymphs Abs 2.0 0.7 - 4.0 K/uL   Monocytes Absolute 1.6 (H) 0.1 - 1.0 K/uL   Eosinophils Absolute 0.1 0.0 - 0.7 K/uL   Basophils Absolute 0.0 0.0 - 0.1 K/uL   Smear Review MORPHOLOGY UNREMARKABLE   Basic metabolic panel     Status: Abnormal   Collection Time: 05/05/17  9:15 AM  Result Value Ref Range   Sodium 126 (Joshua) 135 - 145 mmol/Joshua   Potassium 3.7 3.5 - 5.1 mmol/Joshua   Chloride 92 (Joshua) 101 - 111 mmol/Joshua   CO2 25 22 - 32 mmol/Joshua   Glucose, Bld 115 (H) 65 - 99 mg/dL   BUN 11 6 - 20 mg/dL   Creatinine, Ser 0.81 0.61 - 1.24 mg/dL   Calcium 8.0 (Joshua) 8.9 - 10.3 mg/dL   GFR calc non Af Amer >60 >60 mL/min   GFR calc Af Amer >60 >60 mL/min    Comment: (NOTE) The eGFR has been calculated using the CKD EPI equation. This calculation has not been validated in all clinical situations. eGFR's persistently <60 mL/min signify possible Chronic Kidney Disease.    Anion gap 9 5 - 15    No results found.  Review of Systems  Constitutional: Negative.   HENT: Negative.   Eyes: Negative.   Respiratory: Negative.   Cardiovascular: Negative.   Gastrointestinal: Negative.   Genitourinary: Negative.   Musculoskeletal: Negative.   Skin: Negative.    Blood pressure (!) 146/74, pulse 99, temperature 99.6 F (37.6 C), temperature source Oral, resp. rate 15, height 5' 10"  (1.778 m), weight 89.8 kg (198 lb), SpO2 93 %. Physical Exam  Constitutional: He appears well-developed.  HENT:  Wearing nasal CPAP at present.   Eyes: Pupils are equal, round, and reactive to light.  Neck: Normal range of motion.  Cardiovascular: Normal rate.   Respiratory: Effort normal.  GI: Soft.  Moderate truncal obesity.   Genitourinary: Penis normal.  Genitourinary Comments: Foley in place with clear yellow urine. DRE 70gm  smooth, no nodules.   Musculoskeletal:  Low back dressing in place.   Neurological: He is alert.  Skin: Skin is warm.  Psychiatric:  He has a normal mood and affect.    Assessment/Plan:  1- Post-op Urinary Retention - Likely multifactorial from baseline prostatic hypertrophy in setting of necessary pain meds post-op. Rec DC with current catheter in place and continue tamsulosin and finasteride at outpatient. RX's for this left on chart. We will arrange for trial of void in about 5-7 days at our office. Pt and wife voiced understanding of plan.   2 - Prostate Screening - exam reassuring. No role for PSA based screening at his age.   3 - Bilateral Renal Cysts - rec CT abd in elective setting to maximally r/o cystic renal cancer, though suspicion low based on favorable renal US.   Aminah Zabawa 05/05/2017, 1:24 PM

## 2017-05-05 NOTE — Progress Notes (Signed)
Patient ID: Marca AnconaRobert L Crotwell, male   DOB: March 01, 1941, 76 y.o.   MRN: [id number] Called patient. Had pain last night and could not void. Scan over 600cc. I/O for that amount. Felt catheter. No pain yesterday now today. Spoke with nurse for rescan at 11 and foley. Will obtain cbc, bmet. IV infiltrated. Encouraged PO. Start Flomax. OOB. Also Brace may help. Avel Peacerew perkins to see.

## 2017-05-05 NOTE — Progress Notes (Signed)
   Subjective: 2 Days Post-Op Procedure(s) (LRB): Microlumbar decompression L4-5, L3-4,  L5-S1 WITH LATERAL AUTOGRAFT FUSION (N/A) Patient reports pain as moderate.   Patient seen in rounds for [name] Patient is having problems with pain in the legs, requiring pain medications and urinary retention He walked about 800 feet yesterday as per his therapy note.  May have overdone it a bit. Woke up this morning with pain in both legs around 2:30 AM.  Received two doses of IV Dilaudid since then. Patient states that he was voided okay yesterday but unable to void this morning requiring I/O cath with over 600 residual. Increase in pain this morning.  Objective: Vital signs in last 24 hours: Temp:  [98.7 F (37.1 C)-100 F (37.8 C)] 99.8 F (37.7 C) (05/18 0449) Pulse Rate:  [73-95] 88 (05/18 0449) Resp:  [15-16] 16 (05/18 0449) BP: (102-139)/(50-79) 128/69 (05/18 0449) SpO2:  [95 %-98 %] 95 % (05/18 0449)  Intake/Output from previous day: 05/17 0701 - 05/18 0700 In: 1400 [P.O.:900; I.V.:500] Out: 950 [Urine:950] Intake/Output this shift: No intake/output data recorded.   Recent Labs  05/04/17 0406  HGB 11.8*    Recent Labs  05/04/17 0406  WBC 15.6*  RBC 3.85*  HCT 33.8*  PLT 326    Recent Labs  05/04/17 0406  NA 131*  K 4.0  CL 96*  CO2 25  BUN 10  CREATININE 1.00  GLUCOSE 129*  CALCIUM 8.8*   No results for input(s): LABPT, INR in the last 72 hours.  EXAM General - Patient is Alert, Appropriate and Oriented Extremity - Neurovascular intact Sensation intact distally throughout both lower extremities Intact pulses distally Good rectal tone and no saddle numbness noted on exam Dressing - dressing C/D/I, dressing changed and incision okay, staples intact Motor Function - Dorsiflexion/Plantar flexion intact to both feet. Weakness noted in right EHL 3/5 preexisting Slight weakness in left EHL 4-5/5 Good motor function otherwise to both legs.  Past  Medical History:  Diagnosis Date  . High cholesterol   . Hypertension   . Sleep apnea     Assessment/Plan: 2 Days Post-Op Procedure(s) (LRB): Microlumbar decompression L4-5, L3-4,  L5-S1 WITH LATERAL AUTOGRAFT FUSION (N/A) Active Problems:   Spinal stenosis at L4-L5 level  Estimated body mass index is 28.41 kg/m as calculated from the following:   Height as of this encounter: 5\' 10"  (1.778 m).   Weight as of this encounter: 89.8 kg (198 lb).  Already on Flomax   Avel Peacerew Eliezer Khawaja, PA-C Orthopaedic Surgery 05/05/2017, 8:39 AM

## 2017-05-06 ENCOUNTER — Inpatient Hospital Stay (HOSPITAL_COMMUNITY): Payer: BLUE CROSS/BLUE SHIELD

## 2017-05-06 ENCOUNTER — Encounter (HOSPITAL_COMMUNITY): Payer: Self-pay | Admitting: Radiology

## 2017-05-06 DIAGNOSIS — K567 Ileus, unspecified: Secondary | ICD-10-CM

## 2017-05-06 LAB — BASIC METABOLIC PANEL
Anion gap: 8 (ref 5–15)
Anion gap: 11 (ref 5–15)
BUN: 11 mg/dL (ref 6–20)
BUN: 10 mg/dL (ref 6–20)
CO2: 26 mmol/L (ref 22–32)
CO2: 27 mmol/L (ref 22–32)
Calcium: 7.9 mg/dL — ABNORMAL LOW (ref 8.9–10.3)
Calcium: 8.4 mg/dL — ABNORMAL LOW (ref 8.9–10.3)
Chloride: 87 mmol/L — ABNORMAL LOW (ref 101–111)
Chloride: 85 mmol/L — ABNORMAL LOW (ref 101–111)
Creatinine, Ser: 0.83 mg/dL (ref 0.61–1.24)
Creatinine, Ser: 0.79 mg/dL (ref 0.61–1.24)
GFR calc Af Amer: >60 mL/min (ref >60)
GFR calc Af Amer: >60 mL/min (ref >60)
GFR calc non Af Amer: >60 mL/min (ref >60)
GFR calc non Af Amer: >60 mL/min (ref >60)
Glucose, Bld: 119 mg/dL — ABNORMAL HIGH (ref 65–99)
Glucose, Bld: 132 mg/dL — ABNORMAL HIGH (ref 65–99)
Potassium: 3.6 mmol/L (ref 3.5–5.1)
Potassium: 3.3 mmol/L — ABNORMAL LOW (ref 3.5–5.1)
Sodium: 121 mmol/L — ABNORMAL LOW (ref 135–145)
Sodium: 123 mmol/L — ABNORMAL LOW (ref 135–145)

## 2017-05-06 LAB — BRAIN NATRIURETIC PEPTIDE: B Natriuretic Peptide: 270.2 pg/mL — ABNORMAL HIGH (ref 0.0–100.0)

## 2017-05-06 LAB — OSMOLALITY: Osmolality: 249 mOsm/kg — CL (ref 275–295)

## 2017-05-06 LAB — URIC ACID: Uric Acid, Serum: 4 mg/dL — ABNORMAL LOW (ref 4.4–7.6)

## 2017-05-06 LAB — SODIUM, URINE, RANDOM: Sodium, Ur: <10 mmol/L

## 2017-05-06 LAB — OSMOLALITY, URINE: Osmolality, Ur: 529 mOsm/kg (ref 300–900)

## 2017-05-06 MED ORDER — POTASSIUM CHLORIDE CRYS ER 20 MEQ PO TBCR
40.0000 meq | EXTENDED_RELEASE_TABLET | Freq: Once | ORAL | Status: AC
  Administered 2017-05-06: 40 meq via ORAL
  Filled 2017-05-06: qty 2

## 2017-05-06 MED ORDER — ACETAMINOPHEN 500 MG PO TABS
1000.0000 mg | ORAL_TABLET | Freq: Three times a day (TID) | ORAL | Status: DC
  Administered 2017-05-06 – 2017-05-09 (×10): 1000 mg via ORAL
  Filled 2017-05-06 (×10): qty 2

## 2017-05-06 MED ORDER — POLYETHYLENE GLYCOL 3350 17 G PO PACK
17.0000 g | PACK | Freq: Two times a day (BID) | ORAL | Status: DC
  Administered 2017-05-06 – 2017-05-09 (×4): 17 g via ORAL
  Filled 2017-05-06 (×6): qty 1

## 2017-05-06 MED ORDER — FUROSEMIDE 10 MG/ML IJ SOLN
20.0000 mg | Freq: Once | INTRAMUSCULAR | Status: AC
  Administered 2017-05-06: 20 mg via INTRAVENOUS
  Filled 2017-05-06: qty 2

## 2017-05-06 MED ORDER — IOPAMIDOL (ISOVUE-370) INJECTION 76%
INTRAVENOUS | Status: AC
  Administered 2017-05-06: 100 mL
  Filled 2017-05-06: qty 100

## 2017-05-06 MED ORDER — FUROSEMIDE 10 MG/ML IJ SOLN
40.0000 mg | Freq: Once | INTRAMUSCULAR | Status: AC
  Administered 2017-05-06: 40 mg via INTRAVENOUS
  Filled 2017-05-06: qty 4

## 2017-05-06 MED ORDER — BISACODYL 10 MG RE SUPP
10.0000 mg | Freq: Once | RECTAL | Status: AC
  Administered 2017-05-06: 10 mg via RECTAL
  Filled 2017-05-06: qty 1

## 2017-05-06 NOTE — Progress Notes (Signed)
Pt is on CPAP and 6L of oxygen connected to tubing. O2 sat is in low 90s. No signs of distress noted. RT notified to assess patient. Notified on-call PA Ralene Batheracy Shuford about chest x-ray results and use of 6L of oxygen while on CPAP. Orders given to decrease fluids to 70 ml/hr and to monitor patient since pt is no acute respiratory distress; if pt's condition changes call back and medicine may be consulted. Will continue to monitor.

## 2017-05-06 NOTE — Consult Note (Addendum)
[facility]       PATIENT DETAILS Name: Joshua Mack Age: 76 y.o. Sex: male Date of Birth: [birth year] Admit Date: 05/03/2017 RUE:[id number]:Harris, Chrissie NoaWilliam, MD Requesting MD: Jene EveryBeane, Jeffrey, MD   Date of consultation: 5/19   REASON FOR CONSULTATION:  Hyponatremia  HISTORY OF PRESENT ILLNESS: Joshua Mack is an 76 y.o. male with past medical history of hypertension, OSA and dyslipidemia was admitted by the orthopedic service on 5/16 and underwent microlumbar decompression at L4-L5, L3-L4, and L5-S1. Postoperatively, he was noted to have worsening hyponatremia, intermittent hypoxia. The hospitalist service was asked to evaluate.  Patient remains on IV fluids postoperatively, he has finished a significant portion of his breakfast this morning. He has not had a bowel movement yet, but is actively passing flatus.  He did have a low-grade fever, but no history of nausea, vomiting, chest pain or abdominal pain or diarrhea. He is lying comfortably in bed and denies shortness of breath.  His main issue is  back pain.  REVIEW OF SYSTEMS:  Constitutional:   No  weight loss, night sweats,  Fevers, chills, fatigue.  HEENT:    No headaches, Difficulty swallowing,Tooth/dental problems,Sore throat,   Cardio-vascular: No chest pain,  Orthopnea, PND, swelling in lower extremities, anasarca  GI:  No heartburn, indigestion, abdominal pain, nausea, vomiting, diarrhea  Resp: No shortness of breath  at rest.  No excess mucus,   No coughing up of blood.No change in color of mucus.  Skin:  no rash or lesions.  GU:  no dysuria, change in color of urine, no urgency or frequency.  No flank pain.  Musculoskeletal: No joint pain or swelling.  No decreased range of motion.  No back pain.  Psych: No change in mood or affect. No depression or anxiety.     ALLERGIES:   Allergies  Allergen Reactions  . Fentanyl Other (See Comments)    Had a reaction during a colonoscopy  in the past. He was told it required Benadryl.  . Midazolam Other (See Comments)    Had a reaction during a colonoscopy in the past. He was told it required Benadryl.  . Amoxicillin Rash    Has patient had a PCN reaction causing immediate rash, facial/tongue/throat swelling, SOB or lightheadedness with hypotension: No Has patient had a PCN reaction causing severe rash involving mucus membranes or skin necrosis: No Has patient had a PCN reaction that required hospitalization: No Has patient had a PCN reaction occurring within the last 10 years: No If all of the above answers are "NO", then may proceed with Cephalosporin use.   . Tavist Nd [Loratadine] Rash    PAST MEDICAL HISTORY: Past Medical History:  Diagnosis Date  . High cholesterol   . Hypertension   . Sleep apnea     PAST SURGICAL HISTORY: Past Surgical History:  Procedure Laterality Date  . LUMBAR LAMINECTOMY/DECOMPRESSION MICRODISCECTOMY N/A 05/03/2017   Procedure: Microlumbar decompression L4-5, L3-4,  L5-S1 WITH LATERAL AUTOGRAFT FUSION;  Surgeon: Jene EveryBeane, Jeffrey, MD;  Location: [facility];  Service: Orthopedics;  Laterality: N/A;    MEDICATIONS AT HOME: Prior to Admission medications   Medication Sig Start Date End Date Taking? Authorizing Provider  acetaminophen (TYLENOL) 500 MG tablet Take 1,000 mg by mouth 2 (two) times daily.   Yes [provider]  amLODipine (NORVASC) 5 MG tablet Take 5 mg by mouth daily.   Yes [provider]  APPLE CIDER VINEGAR PO Take 1 capsule by mouth daily.   Yes  [provider]  Bioflavonoid Products (ESTER-C) TABS Take 1 tablet by mouth daily.   Yes [provider]  cholecalciferol (VITAMIN D) 1000 units tablet Take 1,000 Units by mouth daily.   Yes [provider]  Chromium 1000 MCG TABS Take 1,000 mcg by mouth daily.   Yes [provider]  Garlic 1000 MG CAPS Take 3,000 mg by mouth daily.   Yes [provider]  L-Arginine 500 MG  CAPS Take 500 mg by mouth daily.   Yes [provider]  losartan-hydrochlorothiazide (HYZAAR) 100-25 MG tablet Take 1 tablet by mouth daily. 03/30/17  Yes [provider]  Melatonin 5 MG TABS Take 5 mg by mouth at bedtime.   Yes [provider]  Misc Natural Products (OSTEO BI-FLEX ADV JOINT SHIELD) TABS Take 1 tablet by mouth daily.   Yes [provider]  Misc Natural Products (URINOZINC PLUS) TABS Take 1 tablet by mouth daily.   Yes [provider]  Multiple Vitamin (MULTIVITAMIN WITH MINERALS) TABS tablet Take 1 tablet by mouth daily.   Yes [provider]  Omega-3 Krill Oil 500 MG CAPS Take 500 mg by mouth daily.   Yes [provider]  Plant Sterols and Stanols (CHOLEST OFF PO) Take 1 capsule by mouth daily.   Yes [provider]  Resveratrol 250 MG CAPS Take 250 mg by mouth daily.   Yes [provider]  Turmeric Curcumin 500 MG CAPS Take 500 mg by mouth daily.   Yes [provider]  Ubiquinone (ULTRA COQ10 PO) Take 1 capsule by mouth daily.   Yes [provider]  docusate sodium (COLACE) 100 MG capsule Take 1 capsule (100 mg total) by mouth 2 (two) times daily. 05/03/17 05/03/18  Jene Every, MD  docusate sodium (COLACE) 100 MG capsule Take 1 capsule (100 mg total) by mouth 2 (two) times daily as needed for mild constipation. 05/03/17   Jene Every, MD  finasteride (PROSCAR) 5 MG tablet Take 1 tablet (5 mg total) by mouth daily. 05/05/17 05/05/18  Sebastian Ache, MD  oxyCODONE-acetaminophen (PERCOCET) 5-325 MG tablet Take 1-2 tablets by mouth every 4 (four) hours as needed for severe pain. 05/03/17   Jene Every, MD  polyethylene glycol (MIRALAX / GLYCOLAX) packet Take 17 g by mouth daily. 05/03/17   Jene Every, MD  tamsulosin (FLOMAX) 0.4 MG CAPS capsule Take 1 capsule (0.4 mg total) by mouth daily. 05/05/17   Sebastian Ache, MD    FAMILY HISTORY: No family history of CAD.  SOCIAL  HISTORY:  reports that he has never smoked. He has never used smokeless tobacco. He reports that he does not drink alcohol or use drugs.  PHYSICAL EXAM: Blood pressure 122/83, pulse 88, temperature 99.5 F (37.5 C), temperature source Oral, resp. rate 15, height 5\' 10"  (1.778 m), weight 96.8 kg (213 lb 6.5 oz), SpO2 94 %. General appearance :Awake, alert,In mild distress due to pain.  Eyes:, pupils equally reactive to light and accomodation,no scleral icterus. HEENT: Atraumatic and Normocephalic Neck: supple, no JVD.  Resp:Good air entry bilaterally, few bibasilar rales CVS: S1 S2 regular, no murmurs.  GI: Bowel sounds present, Non tender and not distended with no gaurding, rigidity or rebound.No organomegaly Extremities: B/L Lower Ext shows no edema, both legs are warm to touch Neurology:  speech clear,Non focal, sensation is grossly intact. Psychiatric: Normal judgment and insight. Alert and oriented x 3. Normal mood. Musculoskeletal:No digital cyanosis Skin:No Rash, warm and dry Wounds:N/A  Data reviewed:  I have personally reviewed following labs and imaging studies  LABORATORY DATA: CBC:  Recent Labs Lab 05/04/17 0406 05/05/17 0915  WBC 15.6* 14.3*  NEUTROABS  --  10.6*  HGB 11.8* 11.8*  HCT 33.8* 33.5*  MCV 87.8 87.9  PLT 326 296    Basic Metabolic Panel:  Recent Labs Lab 05/04/17 0406 05/05/17 0915 05/06/17 0506  NA 131* 126* 121*  K 4.0 3.7 3.6  CL 96* 92* 87*  CO2 25 25 26   GLUCOSE 129* 115* 119*  BUN 10 11 11   CREATININE 1.00 0.81 0.83  CALCIUM 8.8* 8.0* 7.9*   GFR Estimated Creatinine Clearance: 89.7 mL/min (by C-G formula based on SCr of 0.83 mg/dL).  Liver Function Tests: No results for input(s): AST, ALT, ALKPHOS, BILITOT, PROT, ALBUMIN in the last 168 hours. No results for input(s): LIPASE, AMYLASE in the last 168 hours. No results for input(s): AMMONIA in the last 168 hours.  Coagulation profile No results for input(s): INR, PROTIME in  the last 168 hours.  Cardiac Enzymes: No results for input(s): CKTOTAL, CKMB, CKMBINDEX, TROPONINI in the last 168 hours.  BNP: Invalid input(s): POCBNP  CBG: No results for input(s): GLUCAP in the last 168 hours.  D-Dimer No results for input(s): DDIMER in the last 72 hours.  Hgb A1c No results for input(s): HGBA1C in the last 72 hours.  Lipid Profile No results for input(s): CHOL, HDL, LDLCALC, TRIG, CHOLHDL, LDLDIRECT in the last 72 hours.  Thyroid function studies No results for input(s): TSH, T4TOTAL, T3FREE, THYROIDAB in the last 72 hours.  Invalid input(s): FREET3  Anemia work up No results for input(s): VITAMINB12, FOLATE, FERRITIN, TIBC, IRON, RETICCTPCT in the last 72 hours.  Urinalysis No results found for: COLORURINE, APPEARANCEUR, LABSPEC, PHURINE, GLUCOSEU, HGBUR, BILIRUBINUR, KETONESUR, PROTEINUR, UROBILINOGEN, NITRITE, LEUKOCYTESUR  Sepsis Labs Lactic Acid, Venous No results found for: LATICACIDVEN  Microbiology Recent Results (from the past 240 hour(s))  Surgical pcr screen     Status: None   Collection Time: 04/27/17 11:02 AM  Result Value Ref Range Status   MRSA, PCR NEGATIVE NEGATIVE Final   Staphylococcus aureus NEGATIVE NEGATIVE Final    Comment:        The Xpert SA Assay (FDA approved for NASAL specimens in patients over 90 years of age), is one component of a comprehensive surveillance program.  Test performance has been validated by [facility] for patients greater than or equal to 52 year old. It is not intended to diagnose infection nor to guide or monitor treatment.      Radiological Exams on Admission: Dg Chest Port 1 View  Result Date: 05/06/2017 CLINICAL DATA:  Hypoxia after back surgery on 05/03/17; RN states that pt O2 level went down to 70s last night; pt id checked with RN EXAM: PORTABLE CHEST - 1 VIEW COMPARISON:  05/05/2017 FINDINGS: Stable somewhat asymmetric interstitial prominence most marked in the right upper lung.  Septal lines peripherally at the left base as before. No new airspace disease. Heart size upper limits normal. No effusion. Mild spondylitic changes in the lower lobe thoracic spine IMPRESSION: 1. Stable asymmetric interstitial opacities of uncertain chronicity. No acute findings. Electronically Signed   By: Corlis Leak M.D.   On: 05/06/2017 08:35   Dg Chest Port 1 View  Result Date: 05/05/2017 CLINICAL DATA:  Oxygen desaturation, fever. Status post lumbar spine surgery May 03, 2017. EXAM: PORTABLE CHEST 1 VIEW COMPARISON:  None available for comparison at time of study interpretation. FINDINGS: Cardiomediastinal silhouette is  normal. Elevated RIGHT hemidiaphragm. Diffuse interstitial prominence without pleural effusion or focal consolidation. No pneumothorax. Soft tissue planes and included osseous structures are nonsuspicious. IMPRESSION: Interstitial prominence can be seen with edema or atypical infection without focal consolidation. Electronically Signed   By: Awilda Metro M.D.   On: 05/05/2017 19:47    Inpatient Medications:   Anti-infectives    Start     Dose/Rate Route Frequency Ordered Stop   05/03/17 1600  ceFAZolin (ANCEF) IVPB 2g/100 mL premix     2 g 200 mL/hr over 30 Minutes Intravenous Every 8 hours 05/03/17 1441 05/04/17 0851   05/03/17 0934  polymyxin B 500,000 Units, bacitracin 50,000 Units in sodium chloride irrigation 0.9 % 500 mL irrigation  Status:  Discontinued       As needed 05/03/17 0934 05/03/17 1227   05/03/17 0805  ceFAZolin (ANCEF) 2-4 GM/100ML-% IVPB    Comments:  Maricela Curet   : cabinet override      05/03/17 0805 05/03/17 0848   05/03/17 0700  ceFAZolin (ANCEF) IVPB 2g/100 mL premix     2 g 200 mL/hr over 30 Minutes Intravenous On call to O.R. 05/03/17 4540 05/03/17 0918     Scheduled Meds: . acetaminophen  1,000 mg Oral Q8H  . acidophilus  1 capsule Oral Daily  . amLODipine  5 mg Oral Daily  . bisacodyl  10 mg Rectal Once  . docusate sodium  100 mg  Oral BID  . finasteride  5 mg Oral Daily  . furosemide  40 mg Intravenous Once  . metoCLOPramide (REGLAN) injection  10 mg Intravenous Q6H  . polyethylene glycol  17 g Oral BID  . tamsulosin  0.4 mg Oral Daily   Continuous Infusions: . methocarbamol (ROBAXIN)  IV Stopped (05/03/17 1320)     Impression/Recommendations: Hyponatremia: He appears euvolemic on my exam-sodium levels have gone down in spite of IV fluids. 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 Stop IV fluids, give one dose of Lasix. Check serum/urine osmolality and urine electrolytes. He currently is asymptomatic from hyponatremia. We will follow  Acute hypoxic respiratory failure: He does have significant interstitial changes on chest x-ray-I have no prior chest x-ray to compare with. He does not appear to be  volume overloaded-furthermore he is also in the postoperative stage-I don't think he has PE, but I think it is prudent to do a CT chest to evaluate his lung parenchyma and at the same time make sure he does not have a PE. We'll check echo if BNP is significantly elevated.  OSA: On CPAP daily at bedtime  Mild ileus: His belly is not tense-he is passing flatus-will give one dose of Dulcolax-and follow. Minimize narcotics as much as possible.  Rest of the issues deferred to the primary service and they urology team.  [facility] will followup again tomorrow. Please contact me if I can be of assistance in the meanwhile. Thank you for this consultation.  Total time spent 45 minutes.  Xariah Silvernail [facility] Pager [phone]  If 7PM-7AM, please contact night-coverage [email or web address] Password [email or web address] 05/06/2017, 10:06 AM

## 2017-05-06 NOTE — Progress Notes (Signed)
Physical Therapy Treatment Patient Details Name: Joshua Mack MRN: [id number] DOB: [birth year] Today's Date: 05/06/2017    History of Present Illness Pt s/p L3-4, L4-5 microlumbar decompression     PT Comments    Slow progress, pain limited.   Follow Up Recommendations  No PT follow up     Equipment Recommendations  Rolling walker with 5" wheels    Recommendations for Other Services OT consult     Precautions / Restrictions Precautions Precautions: Back Precaution Comments: Reviewed back precautions.  Pt able to recall 2/4 without cues Required Braces or Orthoses: Spinal Brace Spinal Brace: Applied in sitting position Other Brace/Splint: Brace is for comfort only Restrictions Weight Bearing Restrictions: No    Mobility  Bed Mobility Overal bed mobility: Needs Assistance Bed Mobility: Supine to Sit     Supine to sit: Min guard;Supervision     General bed mobility comments: cues for correct log roll technique and adherence to back precautions.   Transfers Overall transfer level: Needs assistance Equipment used: Rolling walker (2 wheeled) Transfers: Sit to/from Stand Sit to Stand: Min guard         General transfer comment: cues for transition position use of UEs to self assist and adherence to back precautions  Ambulation/Gait Ambulation/Gait assistance: Min assist;Min guard Ambulation Distance (Feet): 90 Feet Assistive device: Rolling walker (2 wheeled) Gait Pattern/deviations: Step-through pattern;Shuffle;Decreased step length - left;Decreased step length - right Gait velocity: decr Gait velocity interpretation: Below normal speed for age/gender General Gait Details: cues for posture and position from RW   Stairs            Wheelchair Mobility    Modified Rankin (Stroke Patients Only)       Balance                                            Cognition Arousal/Alertness: Awake/alert Behavior During Therapy: WFL for  tasks assessed/performed Overall Cognitive Status: Within Functional Limits for tasks assessed                                        Exercises      General Comments        Pertinent Vitals/Pain Pain Assessment: 0-10 Pain Score: 7  (7/10 with ambulation, 3/10 at rest) Pain Location: back Pain Descriptors / Indicators: Aching;Sore Pain Intervention(s): Limited activity within patient's tolerance;Monitored during session    Home Living                      Prior Function            PT Goals (current goals can now be found in the care plan section) Acute Rehab PT Goals Patient Stated Goal: Regain IND and get back to work PT Goal Formulation: With patient Time For Goal Achievement: 05/06/17 Potential to Achieve Goals: Good Progress towards PT goals: Progressing toward goals    Frequency    Min 6X/week      PT Plan Current plan remains appropriate    Co-evaluation              AM-PAC PT "6 Clicks" Daily Activity  Outcome Measure  Difficulty turning over in bed (including adjusting bedclothes, sheets and blankets)?: A Little Difficulty moving from lying on back to  sitting on the side of the bed? : A Little Difficulty sitting down on and standing up from a chair with arms (e.g., wheelchair, bedside commode, etc,.)?: A Lot Help needed moving to and from a bed to chair (including a wheelchair)?: A Little Help needed walking in hospital room?: A Little Help needed climbing 3-5 steps with a railing? : A Lot 6 Click Score: 16    End of Session Equipment Utilized During Treatment: Oxygen Activity Tolerance: Patient limited by fatigue;Patient limited by pain Patient left: in chair;with call bell/phone within reach;with family/visitor present Nurse Communication: Mobility status PT Visit Diagnosis: Difficulty in walking, not elsewhere classified (R26.2)     Time: 1420-1435 PT Time Calculation (min) (ACUTE ONLY): 15 min  Charges:   $Gait Training: 8-22 mins                    G Codes:       Pg [phone]    Joshua Mack 05/06/2017, 3:56 PM

## 2017-05-06 NOTE — Progress Notes (Deleted)
Pt is on CPAP and 6L of oxygen connected to tubing. O2 sat is in low 90s. No signs of distress noted. RT notified to assess patient. Notified on-call PA Tracy Shuford about chest x-ray results and use of 6L of oxygen while on CPAP. Orders given to decrease fluids to 70 ml/hr and to monitor patient since pt is no acute respiratory distress; if pt's condition changes call back and medicine may be consulted. Will continue to monitor. 

## 2017-05-06 NOTE — Progress Notes (Signed)
Subjective: 3 Days Post-Op Procedure(s) (LRB): Microlumbar decompression L4-5, L3-4,  L5-S1 WITH LATERAL AUTOGRAFT FUSION (N/A) Patient reports pain as 3 on 0-10 scale and 4 on 0-10 scale.Main issue is his low back pain and not his legs. He denies any leg pain. He is alert and has bowel sounds but is distended. Will give him a Dulcolax suppository. His Sodium is 121 and we discussed this with the Hospitalist and he will assist in his medical management.He has excellent function in his lowers. Oxygen Sat. Is 94 this morning.Chest Xray shows peculiar Interstitial markings.Plan is to ambulate him.    Objective: Vital signs in last 24 hours: Temp:  [98.8 F (37.1 C)-100.4 F (38 C)] 99.5 F (37.5 C) (05/19 0534) Pulse Rate:  [86-99] 88 (05/19 0534) Resp:  [15-16] 15 (05/19 0534) BP: (122-149)/(71-83) 122/83 (05/19 0534) SpO2:  [93 %-98 %] 94 % (05/19 0534) Weight:  [96.8 kg (213 lb 6.5 oz)] 96.8 kg (213 lb 6.5 oz) (05/19 0905)  Intake/Output from previous day: 05/18 0701 - 05/19 0700 In: 2435 [P.O.:340; I.V.:2095] Out: 1860 [Urine:1860] Intake/Output this shift: No intake/output data recorded.   Recent Labs  05/04/17 0406 05/05/17 0915  HGB 11.8* 11.8*    Recent Labs  05/04/17 0406 05/05/17 0915  WBC 15.6* 14.3*  RBC 3.85* 3.81*  HCT 33.8* 33.5*  PLT 326 296    Recent Labs  05/05/17 0915 05/06/17 0506  NA 126* 121*  K 3.7 3.6  CL 92* 87*  CO2 25 26  BUN 11 11  CREATININE 0.81 0.83  GLUCOSE 115* 119*  CALCIUM 8.0* 7.9*   No results for input(s): LABPT, INR in the last 72 hours.  Neurologically intact Dorsiflexion/Plantar flexion intact  Assessment/Plan: 3 Days Post-Op Procedure(s) (LRB): Microlumbar decompression L4-5, L3-4,  L5-S1 WITH LATERAL AUTOGRAFT FUSION (N/A) Up with therapy  Mikaela Hilgeman A 05/06/2017, 9:09 AM

## 2017-05-07 ENCOUNTER — Inpatient Hospital Stay (HOSPITAL_COMMUNITY): Payer: BLUE CROSS/BLUE SHIELD

## 2017-05-07 DIAGNOSIS — I340 Nonrheumatic mitral (valve) insufficiency: Secondary | ICD-10-CM

## 2017-05-07 DIAGNOSIS — J9691 Respiratory failure, unspecified with hypoxia: Secondary | ICD-10-CM

## 2017-05-07 DIAGNOSIS — R609 Edema, unspecified: Secondary | ICD-10-CM

## 2017-05-07 DIAGNOSIS — J9601 Acute respiratory failure with hypoxia: Secondary | ICD-10-CM

## 2017-05-07 DIAGNOSIS — E871 Hypo-osmolality and hyponatremia: Secondary | ICD-10-CM

## 2017-05-07 LAB — BASIC METABOLIC PANEL
Anion gap: 8 (ref 5–15)
BUN: 8 mg/dL (ref 6–20)
CO2: 31 mmol/L (ref 22–32)
Calcium: 8.4 mg/dL — ABNORMAL LOW (ref 8.9–10.3)
Chloride: 88 mmol/L — ABNORMAL LOW (ref 101–111)
Creatinine, Ser: 0.93 mg/dL (ref 0.61–1.24)
GFR calc Af Amer: >60 mL/min (ref >60)
GFR calc non Af Amer: >60 mL/min (ref >60)
Glucose, Bld: 111 mg/dL — ABNORMAL HIGH (ref 65–99)
Potassium: 3.4 mmol/L — ABNORMAL LOW (ref 3.5–5.1)
Sodium: 127 mmol/L — ABNORMAL LOW (ref 135–145)

## 2017-05-07 LAB — ECHOCARDIOGRAM COMPLETE
Height: 70 in
Weight: 3382.74 oz

## 2017-05-07 LAB — HEPARIN LEVEL (UNFRACTIONATED): Heparin Unfractionated: 0.21 IU/mL — ABNORMAL LOW (ref 0.30–0.70)

## 2017-05-07 MED ORDER — HEPARIN BOLUS VIA INFUSION
4800.0000 [IU] | Freq: Once | INTRAVENOUS | Status: AC
  Administered 2017-05-07: 4800 [IU] via INTRAVENOUS
  Filled 2017-05-07: qty 4800

## 2017-05-07 MED ORDER — HEPARIN BOLUS VIA INFUSION
2000.0000 [IU] | Freq: Once | INTRAVENOUS | Status: AC
  Administered 2017-05-08: 2000 [IU] via INTRAVENOUS
  Filled 2017-05-07: qty 2000

## 2017-05-07 MED ORDER — FUROSEMIDE 10 MG/ML IJ SOLN
40.0000 mg | Freq: Once | INTRAMUSCULAR | Status: AC
  Administered 2017-05-07: 40 mg via INTRAVENOUS
  Filled 2017-05-07: qty 4

## 2017-05-07 MED ORDER — HEPARIN (PORCINE) IN NACL 100-0.45 UNIT/ML-% IJ SOLN
1600.0000 [IU]/h | INTRAMUSCULAR | Status: DC
  Administered 2017-05-07 (×2): 1600 [IU]/h via INTRAVENOUS
  Filled 2017-05-07 (×2): qty 250

## 2017-05-07 MED ORDER — HEPARIN (PORCINE) IN NACL 100-0.45 UNIT/ML-% IJ SOLN
1800.0000 [IU]/h | INTRAMUSCULAR | Status: DC
  Administered 2017-05-08 (×2): 1800 [IU]/h via INTRAVENOUS
  Filled 2017-05-07 (×2): qty 250

## 2017-05-07 MED ORDER — RIVAROXABAN 15 MG PO TABS
15.0000 mg | ORAL_TABLET | Freq: Two times a day (BID) | ORAL | Status: DC
  Filled 2017-05-07: qty 1

## 2017-05-07 MED ORDER — LEVOFLOXACIN IN D5W 500 MG/100ML IV SOLN
500.0000 mg | INTRAVENOUS | Status: DC
  Administered 2017-05-07 – 2017-05-09 (×3): 500 mg via INTRAVENOUS
  Filled 2017-05-07 (×4): qty 100

## 2017-05-07 MED ORDER — POTASSIUM CHLORIDE CRYS ER 20 MEQ PO TBCR
30.0000 meq | EXTENDED_RELEASE_TABLET | ORAL | Status: AC
  Administered 2017-05-07 (×2): 30 meq via ORAL
  Filled 2017-05-07 (×2): qty 1

## 2017-05-07 NOTE — Progress Notes (Signed)
VASCULAR LAB PRELIMINARY  PRELIMINARY  PRELIMINARY  PRELIMINARY  Bilateral lower extremity venous duplex completed.    Preliminary report:  Right:  No evidence of DVT, superficial thrombosis, or Baker's cyst. Left - Positive for an acute DVT forming at the level of the proximal popliteal vein. There is no evidence of a superficial thrombosis or Baker's cyst.  Arthuro Canelo, RVS 05/07/2017, 11:24 AM

## 2017-05-07 NOTE — Progress Notes (Signed)
Jill AlexandersJustin PA advised about patients newly diagnosed DVT; Asked per Dr Elvera LennoxGherghe if okay to anti-cogulate the patient. Jill AlexandersJustin states okay to anticoagulate the patient. Dr Elvera LennoxGherghe advised

## 2017-05-07 NOTE — Progress Notes (Signed)
ANTICOAGULATION CONSULT NOTE - Initial Consult  Pharmacy Consult for Xarelto Indication: DVT  Allergies  Allergen Reactions  . Fentanyl Other (See Comments)    Had a reaction during a colonoscopy in the past. He was told it required Benadryl.  . Midazolam Other (See Comments)    Had a reaction during a colonoscopy in the past. He was told it required Benadryl.  . Amoxicillin Rash    Has patient had a PCN reaction causing immediate rash, facial/tongue/throat swelling, SOB or lightheadedness with hypotension: No Has patient had a PCN reaction causing severe rash involving mucus membranes or skin necrosis: No Has patient had a PCN reaction that required hospitalization: No Has patient had a PCN reaction occurring within the last 10 years: No If all of the above answers are "NO", then may proceed with Cephalosporin use.   . Tavist Nd [Loratadine] Rash    Patient Measurements: Height: 5\' 10"  (177.8 cm) Weight: 211 lb 6.7 oz (95.9 kg) IBW/kg (Calculated) : 73  Vital Signs: Temp: 99.7 F (37.6 C) (05/20 0459) Temp Source: Oral (05/20 0459) BP: 135/73 (05/20 0459) Pulse Rate: 91 (05/20 0929)  Labs:  Recent Labs  05/05/17 0915 05/06/17 0506 05/06/17 1346 05/07/17 0417  HGB 11.8*  --   --   --   HCT 33.5*  --   --   --   PLT 296  --   --   --   CREATININE 0.81 0.83 0.79 0.93    Estimated Creatinine Clearance: 79.8 mL/min (by C-G formula based on SCr of 0.93 mg/dL).  Assessment: 76yoM admitted to orthopedic surgery service and underwent microlumbar decompression L4-L5, L3-L4, and L5-S1. Postoperatively he was noted to have hyponatremia as well as hypoxia. LE dopplers positive for acute DVT on left lower extremity.  Starting Xarelto for treatment.  SCr WNL.  Hgb stable, platelets WNL.  Has not received any anticoagulants this admission.    Goal of Therapy:  VTE Treatment   Plan:  Xarelto 15 mg PO BID x 21 days followed by 20 mg daily.  Take doses with meals. Xarelto  education prior to discharge.  Clance Bollunyon, Camar Guyton 05/07/2017,12:08 PM

## 2017-05-07 NOTE — Progress Notes (Signed)
Subjective: 4 Days Post-Op Procedure(s) (LRB): Microlumbar decompression L4-5, L3-4,  L5-S1 WITH LATERAL AUTOGRAFT FUSION (N/A)  Patient reports pain as mild to moderate.  Tolerating POs well.  Admits to BM.  Denies fever, chills, N/V, CP, SOB.  Medicine team wants to run a few more tests and monitor patient.  May be ready for D/C in 24 hours.  Objective:   VITALS:  Temp:  [98.6 F (37 C)-100.7 F (38.2 C)] 99.7 F (37.6 C) (05/20 0459) Pulse Rate:  [91-107] 91 (05/20 0929) Resp:  [16-18] 16 (05/20 0459) BP: (131-142)/(66-81) 135/73 (05/20 0459) SpO2:  [90 %-96 %] 96 % (05/20 0929) Weight:  [95.9 kg (211 lb 6.7 oz)] 95.9 kg (211 lb 6.7 oz) (05/20 0500)  General: WDWN patient in NAD. Psych:  Appropriate mood and affect. Neuro:  A&O x 3, Moving all extremities, sensation intact to light touch HEENT:  EOMs intact Chest:  Even non-labored respirations Skin:  Dressing C/D/I, no rashes or lesions Extremities: warm/dry, mild edema, no erythema, or echymosis.  No lymphadenopathy. Pulses: Popliteus 2+ MSK:  ROM: TKE, bilaterally, MMT: patient is able to perform quad sets    LABS  Recent Labs  05/05/17 0915  HGB 11.8*  WBC 14.3*  PLT 296    Recent Labs  05/06/17 1346 05/07/17 0417  NA 123* 127*  K 3.3* 3.4*  CL 85* 88*  CO2 27 31  BUN 10 8  CREATININE 0.79 0.93  GLUCOSE 132* 111*   No results for input(s): LABPT, INR in the last 72 hours.   Assessment/Plan: 4 Days Post-Op Procedure(s) (LRB): Microlumbar decompression L4-5, L3-4,  L5-S1 WITH LATERAL AUTOGRAFT FUSION (N/A)  Up with therapy WBAT Will D/C home once medicine team states that patient is ready Plan for outpatient post-op visit with [name].  Alfredo MartinezJustin Shellia Hartl, PA-C, [facility] Office:  [phone]

## 2017-05-07 NOTE — Progress Notes (Signed)
  Echocardiogram 2D Echocardiogram has been performed.  Joshua Mack, Joshua Mack R 05/07/2017, 1:33 PM

## 2017-05-07 NOTE — Progress Notes (Addendum)
PROGRESS NOTE  Joshua Mack ZOX:[id number] DOB: 1941/10/16 DOA: 05/03/2017 PCP: Johny Blamer, MD   LOS: 4 days   Brief Narrative: 76 year old male with history of hypertension, sleep apnea, hyperlipidemia, was admitted to orthopedic surgery service and underwent microlumbar decompression L4-L5, L3-L4, and L5-S1.  Postoperatively he was noted to have hyponatremia as well as hypoxia.  Hospitalist service was asked to consult.  Assessment & Plan: Active Problems:   Spinal stenosis at L4-L5 level   Hyponatremia   Respiratory failure with hypoxia (HCC)  Hyponatremia -He appears euvolemic on exam, however CT scan showed bilateral pleural effusions, his BNP is elevated, may be very slightly fluid overloaded.  He responded well to Lasix, excellent urine output and his sodium has improved.  We will repeat the Lasix dose today.  His serum osmolality was decreased suggesting extra intravascular fluid, urine osmolality was inappropriately normal.  I am little bit puzzled about the urinary sodium.  His hypoxic respiratory failure -?  Pneumonia versus asymmetrical edema on the CT scan.  Start empiric Levaquin as patient is febrile and has been complaining of a cough prior to going to the operating room.  BNP was slightly elevated, will also obtain a 2D echo -Encourage ambulation, wean off oxygen  Obstructive sleep apnea -Continue CPAP  Mild ileus -Resolved, had several bowel movements overnight  Acute DVT -US LE showed acute DVT forming at the popliteal vein.  -d/w patient bedside, discussed anticoagulation options, per patient preference will start Xarelto. D/w orthopedic PA, who OKd anticoagulation, would do heparin infusion for now and if no bleeding/stable transition to xarelto tomorrow  [facility] hospitalist will continue to follow.  DVT prophylaxis: Per primary team Code Status: Full code Family Communication: No family at bedside Disposition Plan: Not ready for discharge yet, may be  within 24 hours   Procedures:   2D echo: pending  Antimicrobials:  Levaquin 5/20 >>   Subjective: - no chest pain, shortness of breath, no abdominal pain, nausea or vomiting.  Patient tells me that he has not noticed any dyspnea yesterday when he was walking and his sats were in the 70s.   Objective: Vitals:   05/06/17 2130 05/06/17 2202 05/07/17 0459 05/07/17 0500  BP:  131/66 135/73   Pulse:  96 94   Resp:  16 16   Temp: 98.6 F (37 C) 100 F (37.8 C) 99.7 F (37.6 C)   TempSrc: Oral Oral Oral   SpO2:  96% 94%   Weight:    95.9 kg (211 lb 6.7 oz)  Height:        Intake/Output Summary (Last 24 hours) at 05/07/17 0806 Last data filed at 05/07/17 0459  Gross per 24 hour  Intake             1140 ml  Output             8600 ml  Net            -7460 ml   Filed Weights   05/03/17 0706 05/06/17 0905 05/07/17 0500  Weight: 89.8 kg (198 lb) 96.8 kg (213 lb 6.5 oz) 95.9 kg (211 lb 6.7 oz)    Examination:  Vitals:   05/06/17 2130 05/06/17 2202 05/07/17 0459 05/07/17 0500  BP:  131/66 135/73   Pulse:  96 94   Resp:  16 16   Temp: 98.6 F (37 C) 100 F (37.8 C) 99.7 F (37.6 C)   TempSrc: Oral Oral Oral   SpO2:  96% 94%  Weight:    95.9 kg (211 lb 6.7 oz)  Height:        Constitutional: NAD Eyes: lids and conjunctivae normal ENMT: Mucous membranes are moist.  Respiratory: clear to auscultation bilaterally, no wheezing, no crackles. Normal respiratory effort. No accessory muscle use.  Cardiovascular: Regular rate and rhythm, no murmurs / rubs / gallops. No LE edema. 2+ pedal pulses.  Abdomen: no tenderness. Bowel sounds positive.  Neurologic: CN 2-12 grossly intact. Strength 5/5 in all 4.  Psychiatric: Normal judgment and insight. Alert and oriented x 3. Normal mood.    Data Reviewed: I have personally reviewed following labs and imaging studies  CBC:  Recent Labs Lab 05/04/17 0406 05/05/17 0915  WBC 15.6* 14.3*  NEUTROABS  --  10.6*  HGB 11.8*  11.8*  HCT 33.8* 33.5*  MCV 87.8 87.9  PLT 326 296   Basic Metabolic Panel:  Recent Labs Lab 05/04/17 0406 05/05/17 0915 05/06/17 0506 05/06/17 1346 05/07/17 0417  NA 131* 126* 121* 123* 127*  K 4.0 3.7 3.6 3.3* 3.4*  CL 96* 92* 87* 85* 88*  CO2 25 25 26 27 31   GLUCOSE 129* 115* 119* 132* 111*  BUN 10 11 11 10 8   CREATININE 1.00 0.81 0.83 0.79 0.93  CALCIUM 8.8* 8.0* 7.9* 8.4* 8.4*   GFR: Estimated Creatinine Clearance: 79.8 mL/min (by C-G formula based on SCr of 0.93 mg/dL). Liver Function Tests: No results for input(s): AST, ALT, ALKPHOS, BILITOT, PROT, ALBUMIN in the last 168 hours. No results for input(s): LIPASE, AMYLASE in the last 168 hours. No results for input(s): AMMONIA in the last 168 hours. Coagulation Profile: No results for input(s): INR, PROTIME in the last 168 hours. Cardiac Enzymes: No results for input(s): CKTOTAL, CKMB, CKMBINDEX, TROPONINI in the last 168 hours. BNP (last 3 results) No results for input(s): PROBNP in the last 8760 hours. HbA1C: No results for input(s): HGBA1C in the last 72 hours. CBG: No results for input(s): GLUCAP in the last 168 hours. Lipid Profile: No results for input(s): CHOL, HDL, LDLCALC, TRIG, CHOLHDL, LDLDIRECT in the last 72 hours. Thyroid Function Tests: No results for input(s): TSH, T4TOTAL, FREET4, T3FREE, THYROIDAB in the last 72 hours. Anemia Panel: No results for input(s): VITAMINB12, FOLATE, FERRITIN, TIBC, IRON, RETICCTPCT in the last 72 hours. Urine analysis: No results found for: COLORURINE, APPEARANCEUR, LABSPEC, PHURINE, GLUCOSEU, HGBUR, BILIRUBINUR, KETONESUR, PROTEINUR, UROBILINOGEN, NITRITE, LEUKOCYTESUR Sepsis Labs: Invalid input(s): PROCALCITONIN, LACTICIDVEN  Recent Results (from the past 240 hour(s))  Surgical pcr screen     Status: None   Collection Time: 04/27/17 11:02 AM  Result Value Ref Range Status   MRSA, PCR NEGATIVE NEGATIVE Final   Staphylococcus aureus NEGATIVE NEGATIVE Final     Comment:        The Xpert SA Assay (FDA approved for NASAL specimens in patients over 75 years of age), is one component of a comprehensive surveillance program.  Test performance has been validated by [facility] for patients greater than or equal to 75 year old. It is not intended to diagnose infection nor to guide or monitor treatment.       Radiology Studies: Ct Angio Chest Pe W Or Wo Contrast  Result Date: 05/06/2017 CLINICAL DATA:  Low-grade fever EXAM: CT ANGIOGRAPHY CHEST WITH CONTRAST TECHNIQUE: Multidetector CT imaging of the chest was performed using the standard protocol during bolus administration of intravenous contrast. Multiplanar CT image reconstructions and MIPs were obtained to evaluate the vascular anatomy. CONTRAST:  100 mL Isovue 370  IV COMPARISON:  Chest radiograph dated 05/06/2017 FINDINGS: Cardiovascular: Preferential opacification of the aorta. Evaluation is constrained due to respiratory motion, particularly in the lung apices and bilateral lower lobes. Satisfactory opacification of the pulmonary artery to the lobar level. Within that constraint, there is no evidence of pulmonary embolism. No evidence of thoracic aortic aneurysm or dissection. Mild atherosclerotic calcifications. The heart is normal in size.  No pericardial effusion. Mild coronary atherosclerosis in the LAD. Mediastinum/Nodes: No suspicious mediastinal lymphadenopathy. Visualized thyroid is unremarkable. Lungs/Pleura: Multifocal patchy opacities in the upper and lower lobes bilaterally, right upper lobe predominant (series 6/ image 38). Appearance favors multifocal pneumonia or possibly asymmetric pulmonary edema. Small bilateral pleural effusions. Evaluation of the lung parenchyma is constrained by respiratory motion. Within that constraint, there are no suspicious pulmonary nodules. No pneumothorax. Upper Abdomen: Visualized upper abdomen is unremarkable. Musculoskeletal: Mild degenerative changes of  the visualized thoracolumbar spine. Review of the MIP images confirms the above findings. IMPRESSION: No evidence of pulmonary embolism to the lobar level. Multifocal patchy opacities, right upper lobe predominant, suspicious for pneumonia. Asymmetric interstitial edema is considered less likely. Small bilateral pleural effusions. Electronically Signed   By: Charline Bills M.D.   On: 05/06/2017 16:53   Dg Chest Port 1 View  Result Date: 05/06/2017 CLINICAL DATA:  Hypoxia after back surgery on 05/03/17; RN states that pt O2 level went down to 70s last night; pt id checked with RN EXAM: PORTABLE CHEST - 1 VIEW COMPARISON:  05/05/2017 FINDINGS: Stable somewhat asymmetric interstitial prominence most marked in the right upper lung. Septal lines peripherally at the left base as before. No new airspace disease. Heart size upper limits normal. No effusion. Mild spondylitic changes in the lower lobe thoracic spine IMPRESSION: 1. Stable asymmetric interstitial opacities of uncertain chronicity. No acute findings. Electronically Signed   By: Corlis Leak M.D.   On: 05/06/2017 08:35   Dg Chest Port 1 View  Result Date: 05/05/2017 CLINICAL DATA:  Oxygen desaturation, fever. Status post lumbar spine surgery May 03, 2017. EXAM: PORTABLE CHEST 1 VIEW COMPARISON:  None available for comparison at time of study interpretation. FINDINGS: Cardiomediastinal silhouette is normal. Elevated RIGHT hemidiaphragm. Diffuse interstitial prominence without pleural effusion or focal consolidation. No pneumothorax. Soft tissue planes and included osseous structures are nonsuspicious. IMPRESSION: Interstitial prominence can be seen with edema or atypical infection without focal consolidation. Electronically Signed   By: Awilda Metro M.D.   On: 05/05/2017 19:47     Scheduled Meds: . acetaminophen  1,000 mg Oral Q8H  . acidophilus  1 capsule Oral Daily  . amLODipine  5 mg Oral Daily  . docusate sodium  100 mg Oral BID  .  finasteride  5 mg Oral Daily  . furosemide  40 mg Intravenous Once  . metoCLOPramide (REGLAN) injection  10 mg Intravenous Q6H  . polyethylene glycol  17 g Oral BID  . potassium chloride  30 mEq Oral Q3H  . tamsulosin  0.4 mg Oral Daily   Continuous Infusions: . levofloxacin (LEVAQUIN) IV    . methocarbamol (ROBAXIN)  IV Stopped (05/03/17 1320)    Pamella Pert, MD, PhD [facility] Pager [phone]  If 7PM-7AM, please contact night-coverage [email or web address] Password [email or web address] 05/07/2017, 8:06 AM

## 2017-05-07 NOTE — Progress Notes (Signed)
ANTICOAGULATION CONSULT NOTE - Initial Consult  Pharmacy Consult for IV heparin Indication: DVT  Allergies  Allergen Reactions  . Fentanyl Other (See Comments)    Had a reaction during a colonoscopy in the past. He was told it required Benadryl.  . Midazolam Other (See Comments)    Had a reaction during a colonoscopy in the past. He was told it required Benadryl.  . Amoxicillin Rash    Has patient had a PCN reaction causing immediate rash, facial/tongue/throat swelling, SOB or lightheadedness with hypotension: No Has patient had a PCN reaction causing severe rash involving mucus membranes or skin necrosis: No Has patient had a PCN reaction that required hospitalization: No Has patient had a PCN reaction occurring within the last 10 years: No If all of the above answers are "NO", then may proceed with Cephalosporin use.   . Tavist Nd [Loratadine] Rash    Patient Measurements: Height: 5\' 10"  (177.8 cm) Weight: 211 lb 6.7 oz (95.9 kg) IBW/kg (Calculated) : 73  Vital Signs: Temp: 98.5 F (36.9 C) (05/20 2227) Temp Source: Oral (05/20 2227) BP: 119/85 (05/20 2227) Pulse Rate: 97 (05/20 2227)  Labs:  Recent Labs  05/05/17 0915 05/06/17 0506 05/06/17 1346 05/07/17 0417 05/07/17 2210  HGB 11.8*  --   --   --   --   HCT 33.5*  --   --   --   --   PLT 296  --   --   --   --   HEPARINUNFRC  --   --   --   --  0.21*  CREATININE 0.81 0.83 0.79 0.93  --     Estimated Creatinine Clearance: 79.8 mL/min (by C-G formula based on SCr of 0.93 mg/dL).  Assessment: 76yoM admitted to orthopedic surgery service and underwent microlumbar decompression L4-L5, L3-L4, and L5-S1. Postoperatively he was noted to have hyponatremia as well as hypoxia. LE dopplers positive for acute DVT on left lower extremity.  Starting heparin IV for treatment.  SCr WNL.  Hgb stable, platelets WNL.  Has not received any anticoagulants this admission.  Per not, plan is to transition to Xarelto on 5/21 if there  are no bleeding issues and pt is stable.  2210 HL=0.21 below goal ,no infusion or bleeding issues per RN  Goal of Therapy:  VTE Treatment   Plan:   Rebolus with 2000 units IV heparin x1 now  Increase heparin drip to 1800 units/hr  Recheck HL in 8 hours  Monitor for signs and symptoms of bleeding  Follow up transition to Xarelto    Lorenza EvangelistGreen, Tamula Morrical R 05/07/2017 11:08 PM

## 2017-05-07 NOTE — Progress Notes (Signed)
ANTICOAGULATION CONSULT NOTE - Initial Consult  Pharmacy Consult for IV heparin Indication: DVT  Allergies  Allergen Reactions  . Fentanyl Other (See Comments)    Had a reaction during a colonoscopy in the past. He was told it required Benadryl.  . Midazolam Other (See Comments)    Had a reaction during a colonoscopy in the past. He was told it required Benadryl.  . Amoxicillin Rash    Has patient had a PCN reaction causing immediate rash, facial/tongue/throat swelling, SOB or lightheadedness with hypotension: No Has patient had a PCN reaction causing severe rash involving mucus membranes or skin necrosis: No Has patient had a PCN reaction that required hospitalization: No Has patient had a PCN reaction occurring within the last 10 years: No If all of the above answers are "NO", then may proceed with Cephalosporin use.   . Tavist Nd [Loratadine] Rash    Patient Measurements: Height: 5\' 10"  (177.8 cm) Weight: 211 lb 6.7 oz (95.9 kg) IBW/kg (Calculated) : 73  Vital Signs: Temp: 99.7 F (37.6 C) (05/20 0459) Temp Source: Oral (05/20 0459) BP: 135/73 (05/20 0459) Pulse Rate: 91 (05/20 0929)  Labs:  Recent Labs  05/05/17 0915 05/06/17 0506 05/06/17 1346 05/07/17 0417  HGB 11.8*  --   --   --   HCT 33.5*  --   --   --   PLT 296  --   --   --   CREATININE 0.81 0.83 0.79 0.93    Estimated Creatinine Clearance: 79.8 mL/min (by C-G formula based on SCr of 0.93 mg/dL).  Assessment: 76yoM admitted to orthopedic surgery service and underwent microlumbar decompression L4-L5, L3-L4, and L5-S1. Postoperatively he was noted to have hyponatremia as well as hypoxia. LE dopplers positive for acute DVT on left lower extremity.  Starting heparin IV for treatment.  SCr WNL.  Hgb stable, platelets WNL.  Has not received any anticoagulants this admission.  Per not, plan is to transition to Xarelto on 5/21 if there are no bleeding issues and pt is stable.  Goal of Therapy:  VTE Treatment   Plan:   Heparin 4800 unit bolus, then 1600 units/hr  HL level 8 hours after infusion start  Monitor for signs and symptoms of bleeding  Follow up transition to Xarelto     Joshua [name], PharmD, BCPS Pager [phone] 05/07/2017 12:44 PM

## 2017-05-07 NOTE — Progress Notes (Signed)
PT Cancellation Note  Patient Details Name: Joshua AnconaRobert L Mack MRN: [id number] DOB: 17-May-1941   Cancelled Treatment:     RN reports NEW DVT                Left - Positive for an acute DVT forming at the level of the proximal popliteal vein. There is no evidence of a superficial thrombosis or Baker's cyst.   Felecia ShellingLori Arthelia Callicott  PTA [facility]  Acute  Rehab Pager      [phone]

## 2017-05-08 DIAGNOSIS — G4733 Obstructive sleep apnea (adult) (pediatric): Secondary | ICD-10-CM

## 2017-05-08 DIAGNOSIS — I82432 Acute embolism and thrombosis of left popliteal vein: Secondary | ICD-10-CM

## 2017-05-08 DIAGNOSIS — I82409 Acute embolism and thrombosis of unspecified deep veins of unspecified lower extremity: Secondary | ICD-10-CM

## 2017-05-08 LAB — CBC
HCT: 31.6 % — ABNORMAL LOW (ref 39.0–52.0)
Hemoglobin: 10.7 g/dL — ABNORMAL LOW (ref 13.0–17.0)
MCH: 29.4 pg (ref 26.0–34.0)
MCHC: 33.9 g/dL (ref 30.0–36.0)
MCV: 86.8 fL (ref 78.0–100.0)
Platelets: 391 10*3/uL (ref 150–400)
RBC: 3.64 MIL/uL — ABNORMAL LOW (ref 4.22–5.81)
RDW: 12.2 % (ref 11.5–15.5)
WBC: 8.1 10*3/uL (ref 4.0–10.5)

## 2017-05-08 LAB — BASIC METABOLIC PANEL
Anion gap: 10 (ref 5–15)
BUN: 12 mg/dL (ref 6–20)
CO2: 28 mmol/L (ref 22–32)
Calcium: 8.3 mg/dL — ABNORMAL LOW (ref 8.9–10.3)
Chloride: 93 mmol/L — ABNORMAL LOW (ref 101–111)
Creatinine, Ser: 0.87 mg/dL (ref 0.61–1.24)
GFR calc Af Amer: >60 mL/min (ref >60)
GFR calc non Af Amer: >60 mL/min (ref >60)
Glucose, Bld: 112 mg/dL — ABNORMAL HIGH (ref 65–99)
Potassium: 3.5 mmol/L (ref 3.5–5.1)
Sodium: 131 mmol/L — ABNORMAL LOW (ref 135–145)

## 2017-05-08 LAB — PROCALCITONIN: Procalcitonin: <0.1 ng/mL

## 2017-05-08 MED ORDER — RIVAROXABAN 15 MG PO TABS
15.0000 mg | ORAL_TABLET | Freq: Two times a day (BID) | ORAL | Status: DC
  Administered 2017-05-08 – 2017-05-09 (×2): 15 mg via ORAL
  Filled 2017-05-08 (×3): qty 1

## 2017-05-08 MED ORDER — RIVAROXABAN 20 MG PO TABS: 20.0000 mg | ORAL_TABLET | Freq: Every day | ORAL | Status: DC

## 2017-05-08 NOTE — Progress Notes (Signed)
PROGRESS NOTE  Joshua Mack QMV:[id number] DOB: February 11, 1941 DOA: 05/03/2017 PCP: Joshua BlamerHarris, [name], MD   LOS: 5 days   Brief Narrative: 76 year old male with history of hypertension, sleep apnea, hyperlipidemia, was admitted to orthopedic surgery service and underwent microlumbar decompression L4-L5, L3-L4, and L5-S1.  Postoperatively he was noted to have hyponatremia as well as hypoxia.  Hospitalist service was asked to consult.  Assessment & Plan: Active Problems:   Spinal stenosis at L4-L5 level   Hyponatremia   Respiratory failure with hypoxia (HCC)   DVT (deep venous thrombosis) (HCC)   OSA (obstructive sleep apnea)   Hyponatremia -He appears euvolemic on exam, however CT scan showed bilateral pleural effusions, his BNP was elevated, may be very slightly fluid overloaded.  He responded well to Lasix x2, excellent urine output and his sodium has improved.    Acute hypoxic respiratory failure -?  Pneumonia versus asymmetrical edema on the CT scan.  Started empiric Levaquin as patient is febrile and has been complaining of a cough prior to going to the operating room.   -BNP was slightly elevated, he had a 2D echo on 5/20 which showed normal systolic function and normal diastolic function. -Wean off to room air today if tolerated  Obstructive sleep apnea -Continue CPAP  Mild ileus -Resolved, had several bowel movements overnight  Acute DVT -US LE showed acute DVT forming at the popliteal vein.  -d/w patient bedside, discussed anticoagulation options, per patient preference will start Xarelto.  -Discussed with [name], will start Xarelto today  [facility] hospitalist will continue to follow.   DVT prophylaxis: Per primary team Code Status: Full code Family Communication: No family at bedside Disposition Plan: Not ready for discharge yet, may be within 24 hours   Procedures:   2D echo Study Conclusions - Left ventricle: The cavity size was normal. Systolic function was  normal. The estimated ejection fraction was in the range of 55% to 60%. Wall motion was normal; there were no regional wall motion abnormalities. Left ventricular diastolic function parameters were normal. - Mitral valve: There was mild regurgitation.  Antimicrobials:  Levaquin 5/20 >>   Subjective: -Feels well, denies any shortness of breath, denies any chest pain.   Objective: Vitals:   05/07/17 2227 05/07/17 2255 05/08/17 0500 05/08/17 0638  BP: 119/85   129/81  Pulse: 97 93  94  Resp: 18 18  18   Temp: 98.5 F (36.9 C)   98.4 F (36.9 C)  TempSrc: Oral   Oral  SpO2: 97% 98%  98%  Weight:   94.8 kg (208 lb 15.9 oz)   Height:        Intake/Output Summary (Last 24 hours) at 05/08/17 1043 Last data filed at 05/08/17 0915  Gross per 24 hour  Intake             1260 ml  Output             4120 ml  Net            -2860 ml   Filed Weights   05/06/17 0905 05/07/17 0500 05/08/17 0500  Weight: 96.8 kg (213 lb 6.5 oz) 95.9 kg (211 lb 6.7 oz) 94.8 kg (208 lb 15.9 oz)    Examination:  Vitals:   05/07/17 2227 05/07/17 2255 05/08/17 0500 05/08/17 0638  BP: 119/85   129/81  Pulse: 97 93  94  Resp: 18 18  18   Temp: 98.5 F (36.9 C)   98.4 F (36.9 C)  TempSrc: Oral  Oral  SpO2: 97% 98%  98%  Weight:   94.8 kg (208 lb 15.9 oz)   Height:       Constitutional: NAD, calm, comfortable Respiratory: clear to auscultation bilaterally, no wheezing, no crackles. Normal respiratory effort.  Cardiovascular: Regular rate and rhythm, no murmurs / rubs / gallops. No LE edema. Abdomen: no tenderness. Bowel sounds positive.  Skin: no rashes, lesions, ulcers. No induration Neurologic: non focal   Data Reviewed: I have personally reviewed following labs and imaging studies  CBC:  Recent Labs Lab 05/04/17 0406 05/05/17 0915 05/08/17 0417  WBC 15.6* 14.3* 8.1  NEUTROABS  --  10.6*  --   HGB 11.8* 11.8* 10.7*  HCT 33.8* 33.5* 31.6*  MCV 87.8 87.9 86.8  PLT 326 296 391   Basic  Metabolic Panel:  Recent Labs Lab 05/05/17 0915 05/06/17 0506 05/06/17 1346 05/07/17 0417 05/08/17 0417  NA 126* 121* 123* 127* 131*  K 3.7 3.6 3.3* 3.4* 3.5  CL 92* 87* 85* 88* 93*  CO2 25 26 27 31 28   GLUCOSE 115* 119* 132* 111* 112*  BUN 11 11 10 8 12   CREATININE 0.81 0.83 0.79 0.93 0.87  CALCIUM 8.0* 7.9* 8.4* 8.4* 8.3*   GFR: Estimated Creatinine Clearance: 84.8 mL/min (by C-G formula based on SCr of 0.87 mg/dL). Liver Function Tests: No results for input(s): AST, ALT, ALKPHOS, BILITOT, PROT, ALBUMIN in the last 168 hours. No results for input(s): LIPASE, AMYLASE in the last 168 hours. No results for input(s): AMMONIA in the last 168 hours. Coagulation Profile: No results for input(s): INR, PROTIME in the last 168 hours. Cardiac Enzymes: No results for input(s): CKTOTAL, CKMB, CKMBINDEX, TROPONINI in the last 168 hours. BNP (last 3 results) No results for input(s): PROBNP in the last 8760 hours. HbA1C: No results for input(s): HGBA1C in the last 72 hours. CBG: No results for input(s): GLUCAP in the last 168 hours. Lipid Profile: No results for input(s): CHOL, HDL, LDLCALC, TRIG, CHOLHDL, LDLDIRECT in the last 72 hours. Thyroid Function Tests: No results for input(s): TSH, T4TOTAL, FREET4, T3FREE, THYROIDAB in the last 72 hours. Anemia Panel: No results for input(s): VITAMINB12, FOLATE, FERRITIN, TIBC, IRON, RETICCTPCT in the last 72 hours. Urine analysis: No results found for: COLORURINE, APPEARANCEUR, LABSPEC, PHURINE, GLUCOSEU, HGBUR, BILIRUBINUR, KETONESUR, PROTEINUR, UROBILINOGEN, NITRITE, LEUKOCYTESUR Sepsis Labs: Invalid input(s): PROCALCITONIN, LACTICIDVEN  No results found for this or any previous visit (from the past 240 hour(s)).    Radiology Studies: Ct Angio Chest Pe W Or Wo Contrast  Result Date: 05/06/2017 CLINICAL DATA:  Low-grade fever EXAM: CT ANGIOGRAPHY CHEST WITH CONTRAST TECHNIQUE: Multidetector CT imaging of the chest was performed using  the standard protocol during bolus administration of intravenous contrast. Multiplanar CT image reconstructions and MIPs were obtained to evaluate the vascular anatomy. CONTRAST:  100 mL Isovue 370 IV COMPARISON:  Chest radiograph dated 05/06/2017 FINDINGS: Cardiovascular: Preferential opacification of the aorta. Evaluation is constrained due to respiratory motion, particularly in the lung apices and bilateral lower lobes. Satisfactory opacification of the pulmonary artery to the lobar level. Within that constraint, there is no evidence of pulmonary embolism. No evidence of thoracic aortic aneurysm or dissection. Mild atherosclerotic calcifications. The heart is normal in size.  No pericardial effusion. Mild coronary atherosclerosis in the LAD. Mediastinum/Nodes: No suspicious mediastinal lymphadenopathy. Visualized thyroid is unremarkable. Lungs/Pleura: Multifocal patchy opacities in the upper and lower lobes bilaterally, right upper lobe predominant (series 6/ image 38). Appearance favors multifocal pneumonia or possibly asymmetric pulmonary edema.  Small bilateral pleural effusions. Evaluation of the lung parenchyma is constrained by respiratory motion. Within that constraint, there are no suspicious pulmonary nodules. No pneumothorax. Upper Abdomen: Visualized upper abdomen is unremarkable. Musculoskeletal: Mild degenerative changes of the visualized thoracolumbar spine. Review of the MIP images confirms the above findings. IMPRESSION: No evidence of pulmonary embolism to the lobar level. Multifocal patchy opacities, right upper lobe predominant, suspicious for pneumonia. Asymmetric interstitial edema is considered less likely. Small bilateral pleural effusions. Electronically Signed   By: Charline Bills M.D.   On: 05/06/2017 16:53     Scheduled Meds: . acetaminophen  1,000 mg Oral Q8H  . acidophilus  1 capsule Oral Daily  . amLODipine  5 mg Oral Daily  . docusate sodium  100 mg Oral BID  .  finasteride  5 mg Oral Daily  . metoCLOPramide (REGLAN) injection  10 mg Intravenous Q6H  . polyethylene glycol  17 g Oral BID  . tamsulosin  0.4 mg Oral Daily   Continuous Infusions: . levofloxacin (LEVAQUIN) IV Stopped (05/07/17 1422)  . methocarbamol (ROBAXIN)  IV Stopped (05/03/17 1320)    Pamella Pert, MD, PhD [facility] Pager [phone]  If 7PM-7AM, please contact night-coverage [email or web address] Password [email or web address] 05/08/2017, 10:43 AM

## 2017-05-08 NOTE — Progress Notes (Signed)
Physical Therapy Treatment Patient Details Name: Joshua AnconaRobert L Mack MRN: [id number] DOB: May 15, 1941 Today's Date: 05/08/2017    History of Present Illness Pt s/p L3-4, L4-5 microlumbar decompression     PT Comments    Pt looks much better.  OOB with family in room.  RA at 94%.  Min c/o back pain.  Assisted with applying corset(soft back brace) in standing and educated on proper fit.  Brace is for "comfort".  Assisted with amb a GREAT distance in hallway using RW.  Good alternating gait with min support needed thru walker.  Returned to room, removed corset and positioned in recliner.    Follow Up Recommendations  No PT follow up     Equipment Recommendations  Rolling walker with 5" wheels    Recommendations for Other Services       Precautions / Restrictions Precautions Precautions: Back Precaution Comments: Reviewed back precautions.  Pt able to recall 2/4 without cues Required Braces or Orthoses: Spinal Brace (corset) Other Brace/Splint: Corset for comfort,  instructed to Don/Doff standing for best fit/snuggness Restrictions Weight Bearing Restrictions: No RLE Weight Bearing: Weight bearing as tolerated    Mobility  Bed Mobility               General bed mobility comments: OOB in recliner  Transfers Overall transfer level: Needs assistance Equipment used: Rolling walker (2 wheeled) Transfers: Sit to/from [employer]GI CorporationStand;Stand Pivot Transfers Sit to Stand: Supervision Stand pivot transfers: Supervision       General transfer comment: one initial VC safety with IV line/foley cath  Ambulation/Gait Ambulation/Gait assistance: Supervision;Min guard Ambulation Distance (Feet): 850 Feet Assistive device: Rolling walker (2 wheeled)   Gait velocity: WFL   General Gait Details: good alternating gait with minimal support needed using walker.  Amb a GREAT distance.  Avg HR 82 and RA ranged from 90 to 96%.     Stairs            Wheelchair Mobility    Modified Rankin  (Stroke Patients Only)       Balance                                            Cognition Arousal/Alertness: Awake/alert Behavior During Therapy: WFL for tasks assessed/performed Overall Cognitive Status: Within Functional Limits for tasks assessed                                        Exercises      General Comments        Pertinent Vitals/Pain Pain Assessment: 0-10 Pain Score: 2  Pain Location: back Pain Descriptors / Indicators: Operative site guarding Pain Intervention(s): Monitored during session;Repositioned    Home Living                      Prior Function            PT Goals (current goals can now be found in the care plan section) Progress towards PT goals: Progressing toward goals    Frequency    Min 6X/week      PT Plan Current plan remains appropriate    Co-evaluation              AM-PAC PT "6 Clicks" Daily Activity  Outcome Measure  Difficulty turning  over in bed (including adjusting bedclothes, sheets and blankets)?: A Little Difficulty moving from lying on back to sitting on the side of the bed? : A Little Difficulty sitting down on and standing up from a chair with arms (e.g., wheelchair, bedside commode, etc,.)?: A Lot Help needed moving to and from a bed to chair (including a wheelchair)?: A Little Help needed walking in hospital room?: A Little Help needed climbing 3-5 steps with a railing? : A Lot 6 Click Score: 16    End of Session Equipment Utilized During Treatment: Gait belt Activity Tolerance: Patient limited by fatigue;Patient limited by pain Patient left: in chair;with call bell/phone within reach;with family/visitor present Nurse Communication: Mobility status PT Visit Diagnosis: Difficulty in walking, not elsewhere classified (R26.2)     Time: 4098-1191 PT Time Calculation (min) (ACUTE ONLY): 24 min  Charges:  $Gait Training: 23-37 mins                    G Codes:        {Evee Liska  PTA [facility]  Acute  Rehab Pager      [phone]

## 2017-05-08 NOTE — Progress Notes (Signed)
Subjective: 5 Days Post-Op Procedure(s) (LRB): Microlumbar decompression L4-5, L3-4,  L5-S1 WITH LATERAL AUTOGRAFT FUSION (N/A) Patient reports pain as 3 on 0-10 scale.    Objective: Vital signs in last 24 hours: Temp:  [97.8 F (36.6 C)-98.5 F (36.9 C)] 98.4 F (36.9 C) (05/21 16100638) Pulse Rate:  [91-97] 94 (05/21 0638) Resp:  [18] 18 (05/21 96040638) BP: (119-133)/(72-85) 129/81 (05/21 54090638) SpO2:  [97 %-98 %] 98 % (05/21 81190638) Weight:  [94.8 kg (208 lb 15.9 oz)] 94.8 kg (208 lb 15.9 oz) (05/21 0500)  Intake/Output from previous day: 05/20 0701 - 05/21 0700 In: 1380 [P.O.:1380] Out: 4220 [Urine:4220] Intake/Output this shift: Total I/O In: 0  Out: 250 [Urine:250]   Recent Labs  05/08/17 0417  HGB 10.7*    Recent Labs  05/08/17 0417  WBC 8.1  RBC 3.64*  HCT 31.6*  PLT 391    Recent Labs  05/07/17 0417 05/08/17 0417  NA 127* 131*  K 3.4* 3.5  CL 88* 93*  CO2 31 28  BUN 8 12  CREATININE 0.93 0.87  GLUCOSE 111* 112*  CALCIUM 8.4* 8.3*   No results for input(s): LABPT, INR in the last 72 hours.  Neurologically intact Neurovascular intact Intact pulses distally No cellulitis present Compartment soft Minor pain left knee. Perineal sensation intact. Dressing dry. Assessment/Plan: 5 Days Post-Op Procedure(s) (LRB): Microlumbar decompression L4-5, L3-4,  L5-S1 WITH LATERAL AUTOGRAFT FUSION (N/A) Advance diet Up with therapy Discharge home with home health  Discussed with medicine converting to xarelto for DVT and watch until tomorrow. Dressing change. Pain better. Ileus resolved. Limit narcotics. No asa at home.  Ruble Pumphrey C 05/08/2017, 1:00 PM

## 2017-05-08 NOTE — Progress Notes (Signed)
ANTICOAGULATION CONSULT NOTE - Follow up Consult  Pharmacy Consult for Xarelto Indication: DVT  Allergies  Allergen Reactions  . Fentanyl Other (See Comments)    Had a reaction during a colonoscopy in the past. He was told it required Benadryl.  . Midazolam Other (See Comments)    Had a reaction during a colonoscopy in the past. He was told it required Benadryl.  . Amoxicillin Rash    Has patient had a PCN reaction causing immediate rash, facial/tongue/throat swelling, SOB or lightheadedness with hypotension: No Has patient had a PCN reaction causing severe rash involving mucus membranes or skin necrosis: No Has patient had a PCN reaction that required hospitalization: No Has patient had a PCN reaction occurring within the last 10 years: No If all of the above answers are "NO", then may proceed with Cephalosporin use.   . Tavist Nd [Loratadine] Rash    Patient Measurements: Height: 5\' 10"  (177.8 cm) Weight: 208 lb 15.9 oz (94.8 kg) IBW/kg (Calculated) : 73  Vital Signs: Temp: 98.4 F (36.9 C) (05/21 0638) Temp Source: Oral (05/21 16100638) BP: 129/81 (05/21 96040638) Pulse Rate: 94 (05/21 0638)  Labs:  Recent Labs  05/05/17 0915  05/06/17 1346 05/07/17 0417 05/07/17 2210 05/08/17 0417  HGB 11.8*  --   --   --   --  10.7*  HCT 33.5*  --   --   --   --  31.6*  PLT 296  --   --   --   --  391  HEPARINUNFRC  --   --   --   --  0.21*  --   CREATININE 0.81  < > 0.79 0.93  --  0.87  < > = values in this interval not displayed.  Estimated Creatinine Clearance: 84.8 mL/min (by C-G formula based on SCr of 0.87 mg/dL).  Assessment: 76yoM admitted to orthopedic surgery service and underwent microlumbar decompression L4-L5, L3-L4, and L5-S1 on 05/03/17. Postoperatively he was noted to have hyponatremia as well as hypoxia. LE dopplers positive for acute DVT on left lower extremity.  Initially started on Heparin for treatment on 5/20.  Pharmacy is now consulted to transition to  Xarelto.   Today, 05/08/2017:  Most recent Heparin level 0.21 was subtherapeutic (5/20 2200), Heparin bolus and infusion rate increased.  Heparin drip was d/c 5/21 at 0700.  SCr 0.87, CrCl ~ 85 ml/min  CBC: Hgb decreased to 10.7 and Plt 391.  No bleeding or complications noted.     Goal of Therapy:  VTE Treatment   Plan:  Xarelto 15 mg PO BID x 21 days followed by 20 mg daily.  Take doses with meals. Xarelto education prior to discharge.  Lynann Beaverhristine Albertus Chiarelli PharmD, BCPS Pager [phone] 05/08/2017 12:11 PM

## 2017-05-09 DIAGNOSIS — R609 Edema, unspecified: Secondary | ICD-10-CM

## 2017-05-09 DIAGNOSIS — R0902 Hypoxemia: Secondary | ICD-10-CM

## 2017-05-09 LAB — CBC
HCT: 33.3 % — ABNORMAL LOW (ref 39.0–52.0)
Hemoglobin: 11.4 g/dL — ABNORMAL LOW (ref 13.0–17.0)
MCH: 29.8 pg (ref 26.0–34.0)
MCHC: 34.2 g/dL (ref 30.0–36.0)
MCV: 86.9 fL (ref 78.0–100.0)
Platelets: 441 10*3/uL — ABNORMAL HIGH (ref 150–400)
RBC: 3.83 MIL/uL — ABNORMAL LOW (ref 4.22–5.81)
RDW: 12.4 % (ref 11.5–15.5)
WBC: 9.6 10*3/uL (ref 4.0–10.5)

## 2017-05-09 MED ORDER — LEVOFLOXACIN 500 MG PO TABS: 500.0000 mg | ORAL_TABLET | Freq: Every day | ORAL | 0 refills | Status: AC

## 2017-05-09 MED ORDER — RIVAROXABAN (XARELTO) VTE STARTER PACK (15 & 20 MG): ORAL_TABLET | ORAL | 0 refills | Status: DC

## 2017-05-09 NOTE — Discharge Instructions (Signed)
Walk As Tolerated utilizing back precautions.  No bending, twisting, or lifting.  No driving for 2 weeks.   Aquacel dressing may remain in place until follow up. May shower with aquacel dressing in place. If the dressing peels off or becomes saturated, you may remove aquacel dressing and place gauze and tape dressing which should be kept clean and dry and changed daily. Do not remove steri-strips if they are present. See [name] in office in  One week . No aspirin or NSAID while on xarelto. Walk daily even outside. Use a cane or walker only if necessary. Avoid sitting on soft sofas. See Urologist as scheduled. No supplements other than multivitamin.  Information on my medicine - XARELTO (rivaroxaban)  This medication education was reviewed with me or my healthcare representative as part of my discharge preparation.  The pharmacist that spoke with me during my hospital stay was:  [facility]  WHY WAS Carlena HurlXARELTO PRESCRIBED FOR YOU? Xarelto was prescribed to treat blood clots that may have been found in the veins of your legs (deep vein thrombosis) or in your lungs (pulmonary embolism) and to reduce the risk of them occurring again.  What do you need to know about Xarelto? The starting dose is one 15 mg tablet taken TWICE daily with food for the FIRST 21 DAYS then on (enter date)  05/29/17  the dose is changed to one 20 mg tablet taken ONCE A DAY with your evening meal.  DO NOT stop taking Xarelto without talking to the health care provider who prescribed the medication.  Refill your prescription for 20 mg tablets before you run out.  After discharge, you should have regular check-up appointments with your healthcare provider that is prescribing your Xarelto.  In the future your dose may need to be changed if your kidney function changes by a significant amount.  What do you do if you miss a dose? If you are taking Xarelto TWICE DAILY and you miss a dose, take it as soon as you remember. You  may take two 15 mg tablets (total 30 mg) at the same time then resume your regularly scheduled 15 mg twice daily the next day.  If you are taking Xarelto ONCE DAILY and you miss a dose, take it as soon as you remember on the same day then continue your regularly scheduled once daily regimen the next day. Do not take two doses of Xarelto at the same time.   Important Safety Information Xarelto is a blood thinner medicine that can cause bleeding. You should call your healthcare provider right away if you experience any of the following: ? Bleeding from an injury or your nose that does not stop. ? Unusual colored urine (red or dark brown) or unusual colored stools (red or black). ? Unusual bruising for unknown reasons. ? A serious fall or if you hit your head (even if there is no bleeding).  Some medicines may interact with Xarelto and might increase your risk of bleeding while on Xarelto. To help avoid this, consult your healthcare provider or pharmacist prior to using any new prescription or non-prescription medications, including herbals, vitamins, non-steroidal anti-inflammatory drugs (NSAIDs) and supplements.  This website has more information on Xarelto: [email or web address].

## 2017-05-09 NOTE — Progress Notes (Signed)
PROGRESS NOTE  Joshua AnconaRobert L Mack RUE:[id number] DOB: [birth year] DOA: 05/03/2017 PCP: Joshua BlamerHarris, [name], MD   LOS: 6 days   Brief Narrative: 76 year old male with history of hypertension, sleep apnea, hyperlipidemia, was admitted to orthopedic surgery service and underwent microlumbar decompression L4-L5, L3-L4, and L5-S1.  Postoperatively he was noted to have hyponatremia as well as hypoxia.  Hospitalist service was asked to consult.  Assessment & Plan: Active Problems:   Spinal stenosis at L4-L5 level   Hyponatremia   Respiratory failure with hypoxia (HCC)   DVT (deep venous thrombosis) (HCC)   OSA (obstructive sleep apnea)   Hyponatremia -He appears euvolemic on exam, however CT scan showed bilateral pleural effusions, his BNP was elevated, may be very slightly fluid overloaded.  He responded well to Lasix x2, excellent urine output and his sodium has improved.  -recommend repeat BMP in 1 week as an outpatient  Acute hypoxic respiratory failure su to probable PNA -?  Pneumonia versus asymmetrical edema on the CT scan.  Started empiric Levaquin as patient is febrile and has been complaining of a cough prior to going to the operating room.   -BNP was slightly elevated, he had a 2D echo on 5/20 which showed normal systolic function and normal diastolic function. -on room air now -to continue Levaquin for 3 more days  Obstructive sleep apnea -Continue CPAP  Mild ileus -Resolved, had several bowel movements overnight  Acute DVT -US LE showed acute DVT forming at the popliteal vein.  -Xarelto for 3 months, repeat US at that time  Will sign off, OK to d/c from medicine standpoint    Procedures:   2D echo Study Conclusions - Left ventricle: The cavity size was normal. Systolic function was normal. The estimated ejection fraction was in the range of 55% to 60%. Wall motion was normal; there were no regional wall motion abnormalities. Left ventricular diastolic function parameters  were normal. - Mitral valve: There was mild regurgitation.  Antimicrobials:  Levaquin 5/20 >>   Subjective: - no chest pain, shortness of breath, no abdominal pain, nausea or vomiting.    Objective: Vitals:   05/08/17 1417 05/08/17 2108 05/09/17 0500 05/09/17 0618  BP: 99/66 126/67  122/66  Pulse: 86 95  77  Resp: 18 18  16   Temp: 98.6 F (37 C) 99.3 F (37.4 C)  99.1 F (37.3 C)  TempSrc: Oral Oral  Oral  SpO2: 98% 98%  92%  Weight:   91.8 kg (202 lb 6.1 oz)   Height:        Intake/Output Summary (Last 24 hours) at 05/09/17 0658 Last data filed at 05/09/17 0618  Gross per 24 hour  Intake              440 ml  Output             3500 ml  Net            -3060 ml   Filed Weights   05/07/17 0500 05/08/17 0500 05/09/17 0500  Weight: 95.9 kg (211 lb 6.7 oz) 94.8 kg (208 lb 15.9 oz) 91.8 kg (202 lb 6.1 oz)    Examination:  Vitals:   05/08/17 1417 05/08/17 2108 05/09/17 0500 05/09/17 0618  BP: 99/66 126/67  122/66  Pulse: 86 95  77  Resp: 18 18  16   Temp: 98.6 F (37 C) 99.3 F (37.4 C)  99.1 F (37.3 C)  TempSrc: Oral Oral  Oral  SpO2: 98% 98%  92%  Weight:  91.8 kg (202 lb 6.1 oz)   Height:       Constitutional: NAD Respiratory: CTA biL, no wheezing Cardiovascular: RRR  Data Reviewed: I have personally reviewed following labs and imaging studies  CBC:  Recent Labs Lab 05/04/17 0406 05/05/17 0915 05/08/17 0417 05/09/17 0350  WBC 15.6* 14.3* 8.1 9.6  NEUTROABS  --  10.6*  --   --   HGB 11.8* 11.8* 10.7* 11.4*  HCT 33.8* 33.5* 31.6* 33.3*  MCV 87.8 87.9 86.8 86.9  PLT 326 296 391 441*   Basic Metabolic Panel:  Recent Labs Lab 05/05/17 0915 05/06/17 0506 05/06/17 1346 05/07/17 0417 05/08/17 0417  NA 126* 121* 123* 127* 131*  K 3.7 3.6 3.3* 3.4* 3.5  CL 92* 87* 85* 88* 93*  CO2 25 26 27 31 28   GLUCOSE 115* 119* 132* 111* 112*  BUN 11 11 10 8 12   CREATININE 0.81 0.83 0.79 0.93 0.87  CALCIUM 8.0* 7.9* 8.4* 8.4* 8.3*   GFR: Estimated  Creatinine Clearance: 83.5 mL/min (by C-G formula based on SCr of 0.87 mg/dL). Liver Function Tests: No results for input(s): AST, ALT, ALKPHOS, BILITOT, PROT, ALBUMIN in the last 168 hours. No results for input(s): LIPASE, AMYLASE in the last 168 hours. No results for input(s): AMMONIA in the last 168 hours. Coagulation Profile: No results for input(s): INR, PROTIME in the last 168 hours. Cardiac Enzymes: No results for input(s): CKTOTAL, CKMB, CKMBINDEX, TROPONINI in the last 168 hours. BNP (last 3 results) No results for input(s): PROBNP in the last 8760 hours. HbA1C: No results for input(s): HGBA1C in the last 72 hours. CBG: No results for input(s): GLUCAP in the last 168 hours. Lipid Profile: No results for input(s): CHOL, HDL, LDLCALC, TRIG, CHOLHDL, LDLDIRECT in the last 72 hours. Thyroid Function Tests: No results for input(s): TSH, T4TOTAL, FREET4, T3FREE, THYROIDAB in the last 72 hours. Anemia Panel: No results for input(s): VITAMINB12, FOLATE, FERRITIN, TIBC, IRON, RETICCTPCT in the last 72 hours. Urine analysis: No results found for: COLORURINE, APPEARANCEUR, LABSPEC, PHURINE, GLUCOSEU, HGBUR, BILIRUBINUR, KETONESUR, PROTEINUR, UROBILINOGEN, NITRITE, LEUKOCYTESUR Sepsis Labs: Invalid input(s): PROCALCITONIN, LACTICIDVEN  No results found for this or any previous visit (from the past 240 hour(s)).    Radiology Studies: No results found.   Scheduled Meds: . acetaminophen  1,000 mg Oral Q8H  . acidophilus  1 capsule Oral Daily  . amLODipine  5 mg Oral Daily  . docusate sodium  100 mg Oral BID  . finasteride  5 mg Oral Daily  . metoCLOPramide (REGLAN) injection  10 mg Intravenous Q6H  . polyethylene glycol  17 g Oral BID  . rivaroxaban  15 mg Oral BID WC   Followed by  . [START ON 05/29/2017] rivaroxaban  20 mg Oral Q supper  . tamsulosin  0.4 mg Oral Daily   Continuous Infusions: . levofloxacin (LEVAQUIN) IV Stopped (05/08/17 1136)  . methocarbamol (ROBAXIN)   IV Stopped (05/03/17 1320)    Pamella Pert, MD, PhD [facility] Pager [phone]  If 7PM-7AM, please contact night-coverage [email or web address] Password [facility] 05/09/2017, 6:58 AM

## 2017-05-09 NOTE — Progress Notes (Signed)
Pt was discharged home today. Instructions were reviewed with patient,prescriptions were given to the family  and questions were answered. Pt was taken to main entrance via wheelchair by NT.

## 2017-05-09 NOTE — Progress Notes (Signed)
Subjective: 6 Days Post-Op Procedure(s) (LRB): Microlumbar decompression L4-5, L3-4,  L5-S1 WITH LATERAL AUTOGRAFT FUSION (N/A) Patient reports pain as 2 on 0-10 scale.    Objective: Vital signs in last 24 hours: Temp:  [98.6 F (37 C)-99.3 F (37.4 C)] 99.1 F (37.3 C) (05/22 0618) Pulse Rate:  [77-95] 77 (05/22 0618) Resp:  [16-18] 16 (05/22 0618) BP: (99-126)/(66-67) 122/66 (05/22 0618) SpO2:  [92 %-98 %] 92 % (05/22 0618) Weight:  [91.8 kg (202 lb 6.1 oz)] 91.8 kg (202 lb 6.1 oz) (05/22 0500)  Intake/Output from previous day: 05/21 0701 - 05/22 0700 In: 440 [P.O.:240; IV Piggyback:200] Out: 3500 [Urine:3500] Intake/Output this shift: Total I/O In: -  Out: 2250 [Urine:2250]   Recent Labs  05/08/17 0417 05/09/17 0350  HGB 10.7* 11.4*    Recent Labs  05/08/17 0417 05/09/17 0350  WBC 8.1 9.6  RBC 3.64* 3.83*  HCT 31.6* 33.3*  PLT 391 441*    Recent Labs  05/07/17 0417 05/08/17 0417  NA 127* 131*  K 3.4* 3.5  CL 88* 93*  CO2 31 28  BUN 8 12  CREATININE 0.93 0.87  GLUCOSE 111* 112*  CALCIUM 8.4* 8.3*   No results for input(s): LABPT, INR in the last 72 hours.  Neurologically intact Neurovascular intact Sensation intact distally Dorsiflexion/Plantar flexion intact Incision: dressing C/D/I Perineal sensation intact Dressing dry. No bleeding  Assessment/Plan: 6 Days Post-Op Procedure(s) (LRB): Microlumbar decompression L4-5, L3-4,  L5-S1 WITH LATERAL AUTOGRAFT FUSION (N/A) Advance diet Up with therapy D/C IV fluids Discharge home with home health  Discussed xarelto and signs of bleeding to cal and for any numbness Weakness etc.  Gave home cell phone to call prn. Discussed with medicine. F/U [name] 1 week.  Dontrelle Mazon C 05/09/2017, 6:55 AM

## 2017-05-10 NOTE — Discharge Summary (Signed)
NAME:  Joshua Mack, [name]                     ACCOUNT NO.:  MEDICAL RECORD NO.:  [id number]  LOCATION:                                 FACILITY:  PHYSICIAN:  Jene EveryJeffrey Maleeha Halls, M.D.         DATE OF BIRTH:  DATE OF ADMISSION: DATE OF DISCHARGE:                              DISCHARGE SUMMARY   ADDENDUM:  The patient was admitted on May 03, 2017, to undergo a multilevel lumbar decompression.  The patient's procedure was uneventful.  This was performed on May 16.  Postoperative day 1, the patient was doing well.  He was up and ambulating physical therapy.  The patient, subsequently after his Foley was removed, had difficulty with urination.  This required 2 subsequent in and out catheters, insertion of Foley.  The patient was then subsequently seen by Urology, Dr. Berneice HeinrichManny, who recommended retention of the Foley and medication to facilitate bladder emptying.  Examination at that time, revealed normal rectal exam and perineal sensation, no evidence of a cauda equina.  The following day, the patient was up, had a definitive increase in his back pain.  This patient was medicated and noted increasing pain and hypoxia.  He was on a home CPAP.  Consulted the medical hospitalist, who evaluated the patient with hypoxia.  He underwent a chest x-ray and a CT scan indicating atelectasis and the fluid collection possibly consistent with early pneumonia.  There was no evidence of a pulmonary embolism.  The patient though had Dopplers ordered lower extremities indicating developing DVT in the popliteal vein on the left.  In addition, the patient had a mild temporary ileus and it subsequently resolved.  The medical physician had recommended anticoagulation by postoperative day #4.  This was with heparin followed by Xarelto.  Consulted with the hospitalist on Monday of that weekend. Monday, we decided to withdraw the heparin, he was monitored for bleeding.  The concern was DVT extending to a PE concern was  a postoperative bleed.  Discontinued the heparin and then initiated after the half life of heparin was resolved 6 hours of Xarelto.  The patient took Xarelto by mouth.  Following that, no evidence of bleeding and his dressing was changed and it was clean.  The patient was also placed on a Levaquin for pneumonia, and following this on Tuesday, postoperative day #6, he was felt stable, ileus had resolved, he was neurologically intact.  Foley was to gravity, was eating minimal pain medicine and ambulating and afebrile.  He was discharged to home to follow up with Dr. Berneice HeinrichManny as an outpatient in Urology for a voiding trial.  He was instructed on monitoring of his Xarelto and his legs, for any signs of chest pain, shortness of breath. He was given my cell phone number to facilitate communication and to see me in a week for suture removal and Aquacel dressing was to be left. This was discussed with his wife as well.  In addition, the patient developed a postoperative hyponatremia, which was resolved with fluid restrictions.  Again, the patient was doing well after the postoperative course.  His hospital stay was delayed due to postoperative hypoxia, urinary  retention, his mild ileus, pneumonia, and DVT.     Jene Every, M.D.     Cordelia Pen  D:  05/10/2017  T:  05/10/2017  Job:  [id number]

## 2017-05-10 NOTE — Discharge Summary (Signed)
Patient ID: Joshua Mack MRN: [id number] DOB/AGE: July 06, 1941 76 y.o.  Admit date: 05/03/2017 Discharge date: 05/10/2017  Admission Diagnoses:  Active Problems:   Spinal stenosis at L4-L5 level   Hyponatremia   Respiratory failure with hypoxia (HCC)   DVT (deep venous thrombosis) (HCC)   OSA (obstructive sleep apnea)   Discharge Diagnoses:  Same  Past Medical History:  Diagnosis Date  . High cholesterol   . Hypertension   . Sleep apnea     Surgeries: Procedure(s): Microlumbar decompression L4-5, L3-4,  L5-S1 WITH LATERAL AUTOGRAFT FUSION on 05/03/2017   Consultants: Treatment Team:  Sebastian Ache, MD Lilyan Gilford, MD Leatha Gilding, MD  Discharged Condition: Improved  Hospital Course: Joshua Mack is an 76 y.o. male who was admitted 05/03/2017 for operative treatment of<principal problem not specified>. Patient has severe unremitting pain that affects sleep, daily activities, and work/hobbies. After pre-op clearance the patient was taken to the operating room on 05/03/2017 and underwent  Procedure(s): Microlumbar decompression L4-5, L3-4,  L5-S1 WITH LATERAL AUTOGRAFT FUSION.  Details per dictation:  #Number [id number].  Patient was given perioperative antibiotics: Anti-infectives    Start     Dose/Rate Route Frequency Ordered Stop   05/09/17 0000  levofloxacin (LEVAQUIN) 500 MG tablet     500 mg Oral Daily 05/09/17 0657 05/19/17 2359   05/07/17 0800  levofloxacin (LEVAQUIN) IVPB 500 mg  Status:  Discontinued     500 mg 100 mL/hr over 60 Minutes Intravenous Every 24 hours 05/07/17 0722 05/09/17 2054   05/03/17 1600  ceFAZolin (ANCEF) IVPB 2g/100 mL premix     2 g 200 mL/hr over 30 Minutes Intravenous Every 8 hours 05/03/17 1441 05/04/17 0851   05/03/17 0934  polymyxin B 500,000 Units, bacitracin 50,000 Units in sodium chloride irrigation 0.9 % 500 mL irrigation  Status:  Discontinued       As needed 05/03/17 0934 05/03/17 1227   05/03/17 0805  ceFAZolin (ANCEF) 2-4  GM/100ML-% IVPB    Comments:  Maricela Curet   : cabinet override      05/03/17 0805 05/03/17 0848   05/03/17 0700  ceFAZolin (ANCEF) IVPB 2g/100 mL premix     2 g 200 mL/hr over 30 Minutes Intravenous On call to O.R. 05/03/17 2956 05/03/17 2130       Patient was given sequential compression devices, early ambulation, and chemoprophylaxis to prevent DVT.  Patient benefited maximally from hospital stay and there were no complications.    Recent vital signs: Patient Vitals for the past 24 hrs:  BP Temp Temp src Pulse Resp SpO2  05/09/17 1453 (!) 143/87 98.7 F (37.1 C) Oral 94 14 96 %     Recent laboratory studies:  Recent Labs  05/08/17 0417 05/09/17 0350  WBC 8.1 9.6  HGB 10.7* 11.4*  HCT 31.6* 33.3*  PLT 391 441*  NA 131*  --   K 3.5  --   CL 93*  --   CO2 28  --   BUN 12  --   CREATININE 0.87  --   GLUCOSE 112*  --   CALCIUM 8.3*  --      Discharge Medications:   Allergies as of 05/09/2017      Reactions   Fentanyl Other (See Comments)   Had a reaction during a colonoscopy in the past. He was told it required Benadryl.   Midazolam Other (See Comments)   Had a reaction during a colonoscopy in the past. He was told it required Benadryl.  Amoxicillin Rash   Has patient had a PCN reaction causing immediate rash, facial/tongue/throat swelling, SOB or lightheadedness with hypotension: No Has patient had a PCN reaction causing severe rash involving mucus membranes or skin necrosis: No Has patient had a PCN reaction that required hospitalization: No Has patient had a PCN reaction occurring within the last 10 years: No If all of the above answers are "NO", then may proceed with Cephalosporin use.   Tavist Nd [loratadine] Rash      Medication List    TAKE these medications   acetaminophen 500 MG tablet Commonly known as:  TYLENOL Take 1,000 mg by mouth 2 (two) times daily.   amLODipine 5 MG tablet Commonly known as:  NORVASC Take 5 mg by mouth daily.   APPLE  CIDER VINEGAR PO Take 1 capsule by mouth daily.   cholecalciferol 1000 units tablet Commonly known as:  VITAMIN D Take 1,000 Units by mouth daily.   CHOLEST OFF PO Take 1 capsule by mouth daily.   Chromium 1000 MCG Tabs Take 1,000 mcg by mouth daily.   docusate sodium 100 MG capsule Commonly known as:  COLACE Take 1 capsule (100 mg total) by mouth 2 (two) times daily.   docusate sodium 100 MG capsule Commonly known as:  COLACE Take 1 capsule (100 mg total) by mouth 2 (two) times daily as needed for mild constipation.   ESTER-C Tabs Take 1 tablet by mouth daily.   finasteride 5 MG tablet Commonly known as:  PROSCAR Take 1 tablet (5 mg total) by mouth daily.   Garlic 1000 MG Caps Take 3,000 mg by mouth daily.   L-Arginine 500 MG Caps Take 500 mg by mouth daily.   levofloxacin 500 MG tablet Commonly known as:  LEVAQUIN Take 1 tablet (500 mg total) by mouth daily.   losartan-hydrochlorothiazide 100-25 MG tablet Commonly known as:  HYZAAR Take 1 tablet by mouth daily.   Melatonin 5 MG Tabs Take 5 mg by mouth at bedtime.   multivitamin with minerals Tabs tablet Take 1 tablet by mouth daily.   Omega-3 Krill Oil 500 MG Caps Take 500 mg by mouth daily.   oxyCODONE-acetaminophen 5-325 MG tablet Commonly known as:  PERCOCET Take 1-2 tablets by mouth every 4 (four) hours as needed for severe pain.   polyethylene glycol packet Commonly known as:  MIRALAX / GLYCOLAX Take 17 g by mouth daily.   Resveratrol 250 MG Caps Take 250 mg by mouth daily.   Rivaroxaban 15 & 20 MG Tbpk Commonly known as:  XARELTO STARTER PACK Take as directed on package: Start with one 15mg  tablet by mouth twice a day with food. On Day 22, switch to one 20mg  tablet once a day with food.   tamsulosin 0.4 MG Caps capsule Commonly known as:  FLOMAX Take 1 capsule (0.4 mg total) by mouth daily.   Turmeric Curcumin 500 MG Caps Take 500 mg by mouth daily.   ULTRA COQ10 PO Take 1 capsule by  mouth daily.   URINOZINC PLUS Tabs Take 1 tablet by mouth daily.   OSTEO BI-FLEX ADV JOINT SHIELD Tabs Take 1 tablet by mouth daily.       Diagnostic Studies: Dg Lumbar Spine 2-3 Views  Result Date: 04/27/2017 CLINICAL DATA:  Preoperative exam for lumbar surgery. EXAM: LUMBAR SPINE - 2-3 VIEW COMPARISON:  None. FINDINGS: I am assuming there are 12 sets of ribs. That being the case, there are 5 lumbar type vertebral bodies. There are bilateral pars defects at  L5 with anterolisthesis of 1 cm. There is disc space narrowing at the L5-S1 level. Spinous process abutment is present at L4-5. Mild disc space narrowing from L1-2 through L4-5. Sacroiliac joints appear normal. IMPRESSION: Preoperative numbering. Five lumbar type vertebral bodies. 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 Electronically Signed   By: Paulina Fusi M.D.   On: 04/27/2017 13:19   Ct Angio Chest Pe W Or Wo Contrast  Result Date: 05/06/2017 CLINICAL DATA:  Low-grade fever EXAM: CT ANGIOGRAPHY CHEST WITH CONTRAST TECHNIQUE: Multidetector CT imaging of the chest was performed using the standard protocol during bolus administration of intravenous contrast. Multiplanar CT image reconstructions and MIPs were obtained to evaluate the vascular anatomy. CONTRAST:  100 mL Isovue 370 IV COMPARISON:  Chest radiograph dated 05/06/2017 FINDINGS: Cardiovascular: Preferential opacification of the aorta. Evaluation is constrained due to respiratory motion, particularly in the lung apices and bilateral lower lobes. Satisfactory opacification of the pulmonary artery to the lobar level. Within that constraint, there is no evidence of pulmonary embolism. No evidence of thoracic aortic aneurysm or dissection. Mild atherosclerotic calcifications. The heart is normal in size.  No pericardial effusion. Mild coronary atherosclerosis in the LAD. Mediastinum/Nodes: No suspicious mediastinal lymphadenopathy. Visualized thyroid is unremarkable.  Lungs/Pleura: Multifocal patchy opacities in the upper and lower lobes bilaterally, right upper lobe predominant (series 6/ image 38). Appearance favors multifocal pneumonia or possibly asymmetric pulmonary edema. Small bilateral pleural effusions. Evaluation of the lung parenchyma is constrained by respiratory motion. Within that constraint, there are no suspicious pulmonary nodules. No pneumothorax. Upper Abdomen: Visualized upper abdomen is unremarkable. Musculoskeletal: Mild degenerative changes of the visualized thoracolumbar spine. Review of the MIP images confirms the above findings. IMPRESSION: No evidence of pulmonary embolism to the lobar level. Multifocal patchy opacities, right upper lobe predominant, suspicious for pneumonia. Asymmetric interstitial edema is considered less likely. Small bilateral pleural effusions. Electronically Signed   By: Charline Bills M.D.   On: 05/06/2017 16:53   US Renal  Result Date: 04/10/2017 CLINICAL DATA:  Follow up renal cyst seen on previous MRI study. EXAM: RENAL / URINARY TRACT ULTRASOUND COMPLETE COMPARISON:  No previous imaging in this system. FINDINGS: Right Kidney: Length: 12.1 cm. Normal echogenicity. 3.2 x 3.4 x 3.8 cm cyst in the lower pole. Adjacent 12 mm in diameter cyst. Question some internal septations in this portion. Left Kidney: Length: 12.4 cm. Normal echogenicity. 1.6 cm cyst in the midportion. 7 x 6 x 8 cm cyst at the lower pole. Bladder: Appears normal for degree of bladder distention. IMPRESSION: 3.2 x 3.4 x 3.8 cm cyst at the lower pole the right kidney. Adjacent 1.2 cm cyst, possibly with some internal septations. We would be glad to make direct comparison with previous outside imaging for better correlation, particularly if there was some concerning feature by MRI. Two benign appearing cysts of the left kidney, larger measuring up to 8 cm in diameter at the lower pole. Electronically Signed   By: Paulina Fusi M.D.   On: 04/10/2017 14:08    Dg Chest Port 1 View  Result Date: 05/06/2017 CLINICAL DATA:  Hypoxia after back surgery on 05/03/17; RN states that pt O2 level went down to 70s last night; pt id checked with RN EXAM: PORTABLE CHEST - 1 VIEW COMPARISON:  05/05/2017 FINDINGS: Stable somewhat asymmetric interstitial prominence most marked in the right upper lung. Septal lines peripherally at the left base as before. No new airspace disease. Heart size upper limits normal. No effusion. Mild  spondylitic changes in the lower lobe thoracic spine IMPRESSION: 1. Stable asymmetric interstitial opacities of uncertain chronicity. No acute findings. Electronically Signed   By: Corlis Leak  Hassell M.D.   On: 05/06/2017 08:35   Dg Chest Port 1 View  Result Date: 05/05/2017 CLINICAL DATA:  Oxygen desaturation, fever. Status post lumbar spine surgery May 03, 2017. EXAM: PORTABLE CHEST 1 VIEW COMPARISON:  None available for comparison at time of study interpretation. FINDINGS: Cardiomediastinal silhouette is normal. Elevated RIGHT hemidiaphragm. Diffuse interstitial prominence without pleural effusion or focal consolidation. No pneumothorax. Soft tissue planes and included osseous structures are nonsuspicious. IMPRESSION: Interstitial prominence can be seen with edema or atypical infection without focal consolidation. Electronically Signed   By: Awilda Metroourtnay  Bloomer M.D.   On: 05/05/2017 19:47   Dg Spine Portable 1 View  Result Date: 05/03/2017 CLINICAL DATA:  Intraoperative film #3. EXAM: PORTABLE SPINE - 1 VIEW COMPARISON:  Intraoperative radiograph labeled #2 of today's date FINDINGS: A tissues spreader device overlies the spinous processes of L3, L4, and L5. A pointed metallic trocar oriented superiorly projects just above the L3-4 disc space. A blunt tipped metallic trocar projects along the posterior aspect of the L5-S1 disc space and is approximately 1.3 cm posterior to the posterior margin of L5. IMPRESSION: Localization image #3 with findings as  described. Electronically Signed   By: David  SwazilandJordan M.D.   On: 05/03/2017 11:51   Dg Spine Portable 1 View  Result Date: 05/03/2017 CLINICAL DATA:  Elective surgery.  Spinal numbering. EXAM: PORTABLE SPINE - 1 VIEW COMPARISON:  Preoperative imaging 04/27/2017 and intraoperative imaging earlier today. FINDINGS: Spinous process numbering on the images as requested. The uppermost clamp overlaps the L3 spinous process. A retractor overlaps the L3-L5 spinous processes. A probe projects over the L4-5 facets. L5-S1 anterolisthesis. IMPRESSION: Intraoperative numbering as described and labeled. Electronically Signed   By: Marnee SpringJonathon  Watts M.D.   On: 05/03/2017 09:39   Dg Spine Portable 1 View  Result Date: 05/03/2017 CLINICAL DATA:  Intraoperative localization for spine surgery. EXAM: PORTABLE SPINE - 1 VIEW COMPARISON:  Lumbar spine radiographs 04/27/2017 FINDINGS: There are posterior spinal needles marking the L5 spinous process and the space between the L3 and L4 spinous processes. IMPRESSION: Intraoperative localization as above. Electronically Signed   By: Rudie MeyerP.  Gallerani M.D.   On: 05/03/2017 09:19    Disposition: 01-Home or Self Care  Discharge Instructions    Call MD / Call 911    Complete by:  As directed    If you experience chest pain or shortness of breath, CALL 911 and be transported to the hospital emergency room.  If you develope a fever above 101 F, pus (white drainage) or increased drainage or redness at the wound, or calf pain, call your surgeon's office.   Constipation Prevention    Complete by:  As directed    Drink plenty of fluids.  Prune juice may be helpful.  You may use a stool softener, such as Colace (over the counter) 100 mg twice a day.  Use MiraLax (over the counter) for constipation as needed.   Diet - low sodium heart healthy    Complete by:  As directed    Increase activity slowly as tolerated    Complete by:  As directed       Follow-up Information    Jene EveryBeane,  Can Lucci, MD Follow up in 2 week(s).   Specialty:  Orthopedic Surgery Contact information: [address] [place] [address] [phone]  [facility], [facility] Follow up.   Why:  Office will call to schedule appointment for voiding trial / catheter removal Contact information: [address]  [place] [address] [place] [address] [phone]            Signed: Javier Docker 05/10/2017, 6:50 AM

## 2017-05-12 ENCOUNTER — Emergency Department (HOSPITAL_COMMUNITY)
Admission: EM | Admit: 2017-05-12 | Discharge: 2017-05-12 | Disposition: A | Discharge status: Home / Self Care | Payer: BLUE CROSS/BLUE SHIELD | Attending: Emergency Medicine | Admitting: Emergency Medicine

## 2017-05-12 ENCOUNTER — Encounter (HOSPITAL_COMMUNITY): Payer: Self-pay | Admitting: Emergency Medicine

## 2017-05-12 DIAGNOSIS — Z7982 Long term (current) use of aspirin: Secondary | ICD-10-CM | POA: Diagnosis not present

## 2017-05-12 DIAGNOSIS — Y828 Other medical devices associated with adverse incidents: Secondary | ICD-10-CM | POA: Insufficient documentation

## 2017-05-12 DIAGNOSIS — R369 Urethral discharge, unspecified: Secondary | ICD-10-CM | POA: Diagnosis present

## 2017-05-12 DIAGNOSIS — T83098A Other mechanical complication of other indwelling urethral catheter, initial encounter: Secondary | ICD-10-CM | POA: Diagnosis not present

## 2017-05-12 DIAGNOSIS — Z79899 Other long term (current) drug therapy: Secondary | ICD-10-CM | POA: Diagnosis not present

## 2017-05-12 DIAGNOSIS — Y732 Prosthetic and other implants, materials and accessory gastroenterology and urology devices associated with adverse incidents: Secondary | ICD-10-CM | POA: Insufficient documentation

## 2017-05-12 DIAGNOSIS — Y999 Unspecified external cause status: Secondary | ICD-10-CM | POA: Diagnosis not present

## 2017-05-12 DIAGNOSIS — Y929 Unspecified place or not applicable: Secondary | ICD-10-CM | POA: Diagnosis not present

## 2017-05-12 DIAGNOSIS — Y939 Activity, unspecified: Secondary | ICD-10-CM | POA: Diagnosis not present

## 2017-05-12 DIAGNOSIS — I10 Essential (primary) hypertension: Secondary | ICD-10-CM | POA: Diagnosis not present

## 2017-05-12 DIAGNOSIS — T839XXA Unspecified complication of genitourinary prosthetic device, implant and graft, initial encounter: Secondary | ICD-10-CM

## 2017-05-12 LAB — URINALYSIS, ROUTINE W REFLEX MICROSCOPIC
Bacteria, UA: NONE SEEN
Bilirubin Urine: NEGATIVE
Glucose, UA: NEGATIVE mg/dL
Ketones, ur: NEGATIVE mg/dL
Nitrite: NEGATIVE
Protein, ur: NEGATIVE mg/dL
Specific Gravity, Urine: 1.01 (ref 1.005–1.030)
Squamous Epithelial / LPF: NONE SEEN
pH: 6 (ref 5.0–8.0)

## 2017-05-12 NOTE — ED Triage Notes (Signed)
Pt reports having blood coming from penis that was noted when having bowel movement. Pt currently has indwelling catheter that was done after sx. Pt denies any blood in catheter bag. Pt does report to being on blood thinner.

## 2017-05-12 NOTE — ED Notes (Signed)
Cannot get urine specimen until catheter is replaced- will obtain specimen once its replaced.

## 2017-05-12 NOTE — ED Provider Notes (Signed)
[facility]-EMERGENCY DEPT Provider Note   CSN: [id number] Arrival date & time: 05/12/17  0135  By signing my name below, I, [name]., attest that this documentation has been prepared under the direction and in the presence of [name], Canary Brim, *. Electronically signed: [name]., ED Scribe. 05/12/17. 4:32 AM.    History   Chief Complaint Chief Complaint  Patient presents with  . Penile Discharge    HPI  Joshua Mack is a 76 y.o. male with hx of DVT, spinal surgery who presents to the Emergency Department complaining of penile discharge with onset x1 hour. Per wife, pt was defecating and when he went to wipe he saw blood on the tissue. Pt states that he has had blood and pus leaking from around the foley catheter. He reports penile irritation. He denies any modifying factors. Pt denies any other complaints. Of note, pt is on blood thinners for prior DVT in leg. Pt reports recent antibiotic use.  The history is provided by the patient and the spouse. No language interpreter was used.    Past Medical History:  Diagnosis Date  . High cholesterol   . Hypertension   . Sleep apnea     Patient Active Problem List   Diagnosis Date Noted  . DVT (deep venous thrombosis) (HCC) 05/08/2017  . OSA (obstructive sleep apnea) 05/08/2017  . Hyponatremia 05/07/2017  . Respiratory failure with hypoxia (HCC) 05/07/2017  . Spinal stenosis at L4-L5 level 05/03/2017    Past Surgical History:  Procedure Laterality Date  . LUMBAR LAMINECTOMY/DECOMPRESSION MICRODISCECTOMY N/A 05/03/2017   Procedure: Microlumbar decompression L4-5, L3-4,  L5-S1 WITH LATERAL AUTOGRAFT FUSION;  Surgeon: Jene Every, MD;  Location: [facility] ORS;  Service: Orthopedics;  Laterality: N/A;       Home Medications    Prior to Admission medications   Medication Sig Start Date End Date Taking? Authorizing Provider  acetaminophen (TYLENOL) 500 MG tablet Take 1,000 mg by mouth 2 (two) times  daily.    [provider]  amLODipine (NORVASC) 5 MG tablet Take 5 mg by mouth daily.    [provider]  APPLE CIDER VINEGAR PO Take 1 capsule by mouth daily.    [provider]  Bioflavonoid Products (ESTER-C) TABS Take 1 tablet by mouth daily.    [provider]  cholecalciferol (VITAMIN D) 1000 units tablet Take 1,000 Units by mouth daily.    [provider]  Chromium 1000 MCG TABS Take 1,000 mcg by mouth daily.    [provider]  docusate sodium (COLACE) 100 MG capsule Take 1 capsule (100 mg total) by mouth 2 (two) times daily. 05/03/17 05/03/18  Jene Every, MD  docusate sodium (COLACE) 100 MG capsule Take 1 capsule (100 mg total) by mouth 2 (two) times daily as needed for mild constipation. 05/03/17   Jene Every, MD  finasteride (PROSCAR) 5 MG tablet Take 1 tablet (5 mg total) by mouth daily. 05/05/17 05/05/18  Sebastian Ache, MD  Garlic 1000 MG CAPS Take 3,000 mg by mouth daily.    [provider]  L-Arginine 500 MG CAPS Take 500 mg by mouth daily.    [provider]  levofloxacin (LEVAQUIN) 500 MG tablet Take 1 tablet (500 mg total) by mouth daily. 05/09/17 05/19/17  Leatha Gilding, MD  losartan-hydrochlorothiazide (HYZAAR) 100-25 MG tablet Take 1 tablet by mouth daily. 03/30/17   [provider]  Melatonin 5 MG TABS Take 5 mg by mouth at bedtime.  [provider]  Misc Natural Products (OSTEO BI-FLEX ADV JOINT SHIELD) TABS Take 1 tablet by mouth daily.    [provider]  Misc Natural Products (URINOZINC PLUS) TABS Take 1 tablet by mouth daily.    [provider]  Multiple Vitamin (MULTIVITAMIN WITH MINERALS) TABS tablet Take 1 tablet by mouth daily.    [provider]  Omega-3 Krill Oil 500 MG CAPS Take 500 mg by mouth daily.    [provider]  oxyCODONE-acetaminophen (PERCOCET) 5-325 MG tablet Take 1-2 tablets by mouth every 4 (four) hours as needed for  severe pain. 05/03/17   Jene Every, MD  Plant Sterols and Stanols (CHOLEST OFF PO) Take 1 capsule by mouth daily.    [provider]  polyethylene glycol (MIRALAX / GLYCOLAX) packet Take 17 g by mouth daily. 05/03/17   Jene Every, MD  Resveratrol 250 MG CAPS Take 250 mg by mouth daily.    [provider]  Rivaroxaban (XARELTO STARTER PACK) 15 & 20 MG TBPK Take as directed on package: Start with one 15mg  tablet by mouth twice a day with food. On Day 22, switch to one 20mg  tablet once a day with food. 05/09/17   Leatha Gilding, MD  tamsulosin (FLOMAX) 0.4 MG CAPS capsule Take 1 capsule (0.4 mg total) by mouth daily. 05/05/17   Sebastian Ache, MD  Turmeric Curcumin 500 MG CAPS Take 500 mg by mouth daily.    [provider]  Ubiquinone (ULTRA COQ10 PO) Take 1 capsule by mouth daily.    [provider]    Family History History reviewed. No pertinent family history.  Social History Social History  Substance Use Topics  . Smoking status: Never Smoker  . Smokeless tobacco: Never Used  . Alcohol use No     Allergies   Fentanyl; Midazolam; Amoxicillin; and Tavist nd [loratadine]   Review of Systems Review of Systems  Constitutional: Negative for fever.  Genitourinary: Positive for difficulty urinating and discharge.     Physical Exam Updated Vital Signs BP 139/90 (BP Location: Left Arm)   Pulse 84   Temp 98.4 F (36.9 C) (Oral)   Resp 20   Ht 5\' 10"  (1.778 m)   Wt 88 kg (194 lb)   SpO2 98%   BMI 27.84 kg/m   Physical Exam  Constitutional: He is oriented to person, place, and time. He appears well-developed and well-nourished. No distress.  HENT:  Head: Normocephalic and atraumatic.  Right Ear: Hearing normal.  Left Ear: Hearing normal.  Nose: Nose normal.  Mouth/Throat: Oropharynx is clear and moist and mucous membranes are normal.  Eyes: Conjunctivae and EOM are normal. Pupils are equal, round, and reactive to light.  Neck:  Normal range of motion. Neck supple.  Cardiovascular: Regular rhythm, S1 normal and S2 normal.  Exam reveals no gallop and no friction rub.   No murmur heard. Pulmonary/Chest: Effort normal and breath sounds normal. No respiratory distress. He exhibits no tenderness.  Abdominal: Soft. Normal appearance and bowel sounds are normal. There is no hepatosplenomegaly. There is no tenderness. There is no rebound, no guarding, no tenderness at McBurney's point and negative Murphy's sign. No hernia.  Genitourinary:  Genitourinary Comments: Foley catheter in place with some irritation of the meatus.   Musculoskeletal: Normal range of motion.  Neurological: He is alert and oriented to person, place, and time. He has normal strength. No cranial nerve deficit or sensory deficit. Coordination normal. GCS eye subscore is 4. GCS verbal  subscore is 5. GCS motor subscore is 6.  Skin: Skin is warm, dry and intact. No rash noted. No cyanosis.  Psychiatric: He has a normal mood and affect. His speech is normal and behavior is normal. Thought content normal.  Nursing note and vitals reviewed.    ED Treatments / Results   DIAGNOSTIC STUDIES: Oxygen Saturation is 98% on RA, normal by my interpretation.   COORDINATION OF CARE: 4:32 AM-Discussed next steps with pt. Pt verbalized understanding and is agreeable with the plan.    Labs (all labs ordered are listed, but only abnormal results are displayed) Labs Reviewed  URINALYSIS, ROUTINE W REFLEX MICROSCOPIC - Abnormal; Notable for the following:       Result Value   Hgb urine dipstick LARGE (*)    Leukocytes, UA SMALL (*)    All other components within normal limits  URINE CULTURE    EKG  EKG Interpretation None       Radiology No results found.  Procedures Procedures (including critical care time)  Medications Ordered in ED Medications - No data to display   Initial Impression / Assessment and Plan / ED Course  I have reviewed the triage  vital signs and the nursing notes.  Pertinent labs & imaging results that were available during my care of the patient were reviewed by me and considered in my medical decision making (see chart for details).     Presents over concerns of bleeding around the Foley catheter. 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 Catheter has been draining freely, but tonight he noticed bleeding around it. He is on Xarelto so this alarmed him. Sometime yesterday the adhesive anchor for the catheter fell off and he needed to put some medical tape on the area. It likely has been more mobile secondary to this, likely had some cardiac on the catheter causing some minor trauma. Urinalysis does not show infection. Catheter was replaced and urine is clear. No evidence of ongoing bleeding. Patient and wife reassured, no further intervention necessary.  Final Clinical Impressions(s) / ED Diagnoses   Final diagnoses:  Foley catheter problem, initial encounter [facility](HCC)    New Prescriptions New Prescriptions   No medications on file   I personally performed the services described in this documentation, which was scribed in my presence. The recorded information has been reviewed and is accurate.     Gilda CreasePollina, Mollie Rossano J, MD 05/12/17 [phone]

## 2017-05-13 LAB — URINE CULTURE: Culture: NO GROWTH

## 2017-05-26 NOTE — Addendum Note (Signed)
Addendum  created 05/26/17 1242 by Aasiyah Auerbach, MD   Sign clinical note    

## 2017-05-31 ENCOUNTER — Ambulatory Visit
Admission: RE | Admit: 2017-05-31 | Discharge: 2017-05-31 | Disposition: A | Discharge status: Home / Self Care | Payer: BLUE CROSS/BLUE SHIELD | Source: Ambulatory Visit | Attending: Family Medicine | Admitting: Family Medicine

## 2017-05-31 ENCOUNTER — Other Ambulatory Visit: Payer: Self-pay | Admitting: Family Medicine

## 2017-05-31 DIAGNOSIS — J189 Pneumonia, unspecified organism: Secondary | ICD-10-CM

## 2020-01-08 ENCOUNTER — Ambulatory Visit: Payer: Self-pay | Attending: Internal Medicine

## 2020-01-08 DIAGNOSIS — Z23 Encounter for immunization: Secondary | ICD-10-CM | POA: Insufficient documentation

## 2020-01-08 NOTE — Progress Notes (Signed)
   Covid-19 Vaccination Clinic  Name:  Joshua Mack    MRN: [id number] DOB: 06-07-1941  01/08/2020  [name] was observed post Covid-19 immunization for 15 minutes without incidence. He was provided with Vaccine Information Sheet and instruction to access the V-Safe system.   [name] was instructed to call 911 with any severe reactions post vaccine: Marland Kitchen Difficulty breathing  . Swelling of your face and throat  . A fast heartbeat  . A bad rash all over your body  . Dizziness and weakness    Immunizations Administered    Name Date Dose VIS Date Route   Pfizer COVID-19 Vaccine 01/08/2020  1:03 PM 0.3 mL 11/29/2019 Intramuscular   Manufacturer: [employer], [employer]   Lot: [id number]   NDC: [id number]

## 2020-01-27 ENCOUNTER — Ambulatory Visit: Payer: Self-pay | Attending: Internal Medicine

## 2020-01-27 DIAGNOSIS — Z23 Encounter for immunization: Secondary | ICD-10-CM | POA: Insufficient documentation

## 2020-01-27 NOTE — Progress Notes (Signed)
   Covid-19 Vaccination Clinic  Name:  Joshua Mack    MRN: [id number] DOB: [birth year]  01/27/2020  [name] was observed post Covid-19 immunization for 15 minutes without incidence. He was provided with Vaccine Information Sheet and instruction to access the V-Safe system.   [name] was instructed to call 911 with any severe reactions post vaccine: Marland Kitchen Difficulty breathing  . Swelling of your face and throat  . A fast heartbeat  . A bad rash all over your body  . Dizziness and weakness    Immunizations Administered    Name Date Dose VIS Date Route   Pfizer COVID-19 Vaccine 01/27/2020  9:15 AM 0.3 mL 11/29/2019 Intramuscular   Manufacturer: [employer], [employer]   Lot: [id number]   NDC: [address]-7

## 2020-04-27 ENCOUNTER — Inpatient Hospital Stay (HOSPITAL_BASED_OUTPATIENT_CLINIC_OR_DEPARTMENT_OTHER)
Admission: EM | Admit: 2020-04-27 | Discharge: 2020-05-03 | DRG: 233 | Disposition: A | Discharge status: Home / Self Care | Payer: Commercial Managed Care - PPO | Attending: Thoracic Surgery (Cardiothoracic Vascular Surgery) | Admitting: Thoracic Surgery (Cardiothoracic Vascular Surgery)

## 2020-04-27 ENCOUNTER — Encounter (HOSPITAL_COMMUNITY)
Admission: EM | Disposition: A | Discharge status: Home / Self Care | Payer: Self-pay | Source: Home / Self Care | Attending: Thoracic Surgery (Cardiothoracic Vascular Surgery)

## 2020-04-27 ENCOUNTER — Emergency Department (HOSPITAL_BASED_OUTPATIENT_CLINIC_OR_DEPARTMENT_OTHER): Payer: Commercial Managed Care - PPO

## 2020-04-27 ENCOUNTER — Encounter (HOSPITAL_BASED_OUTPATIENT_CLINIC_OR_DEPARTMENT_OTHER): Payer: Self-pay | Admitting: *Deleted

## 2020-04-27 ENCOUNTER — Other Ambulatory Visit: Payer: Self-pay

## 2020-04-27 DIAGNOSIS — I5041 Acute combined systolic (congestive) and diastolic (congestive) heart failure: Secondary | ICD-10-CM | POA: Diagnosis present

## 2020-04-27 DIAGNOSIS — D62 Acute posthemorrhagic anemia: Secondary | ICD-10-CM | POA: Diagnosis not present

## 2020-04-27 DIAGNOSIS — I213 ST elevation (STEMI) myocardial infarction of unspecified site: Secondary | ICD-10-CM | POA: Diagnosis present

## 2020-04-27 DIAGNOSIS — Z888 Allergy status to other drugs, medicaments and biological substances status: Secondary | ICD-10-CM | POA: Diagnosis not present

## 2020-04-27 DIAGNOSIS — E785 Hyperlipidemia, unspecified: Secondary | ICD-10-CM | POA: Diagnosis present

## 2020-04-27 DIAGNOSIS — I2102 ST elevation (STEMI) myocardial infarction involving left anterior descending coronary artery: Secondary | ICD-10-CM | POA: Diagnosis not present

## 2020-04-27 DIAGNOSIS — E7800 Pure hypercholesterolemia, unspecified: Secondary | ICD-10-CM | POA: Diagnosis present

## 2020-04-27 DIAGNOSIS — I493 Ventricular premature depolarization: Secondary | ICD-10-CM | POA: Diagnosis not present

## 2020-04-27 DIAGNOSIS — I2510 Atherosclerotic heart disease of native coronary artery without angina pectoris: Secondary | ICD-10-CM | POA: Diagnosis present

## 2020-04-27 DIAGNOSIS — Z09 Encounter for follow-up examination after completed treatment for conditions other than malignant neoplasm: Secondary | ICD-10-CM

## 2020-04-27 DIAGNOSIS — E876 Hypokalemia: Secondary | ICD-10-CM | POA: Diagnosis present

## 2020-04-27 DIAGNOSIS — I25110 Atherosclerotic heart disease of native coronary artery with unstable angina pectoris: Secondary | ICD-10-CM | POA: Diagnosis not present

## 2020-04-27 DIAGNOSIS — J9601 Acute respiratory failure with hypoxia: Secondary | ICD-10-CM | POA: Diagnosis not present

## 2020-04-27 DIAGNOSIS — Z951 Presence of aortocoronary bypass graft: Secondary | ICD-10-CM

## 2020-04-27 DIAGNOSIS — Z881 Allergy status to other antibiotic agents status: Secondary | ICD-10-CM

## 2020-04-27 DIAGNOSIS — Z79899 Other long term (current) drug therapy: Secondary | ICD-10-CM

## 2020-04-27 DIAGNOSIS — I2109 ST elevation (STEMI) myocardial infarction involving other coronary artery of anterior wall: Secondary | ICD-10-CM | POA: Diagnosis present

## 2020-04-27 DIAGNOSIS — Z981 Arthrodesis status: Secondary | ICD-10-CM

## 2020-04-27 DIAGNOSIS — Z20822 Contact with and (suspected) exposure to covid-19: Secondary | ICD-10-CM | POA: Diagnosis present

## 2020-04-27 DIAGNOSIS — Z79891 Long term (current) use of opiate analgesic: Secondary | ICD-10-CM

## 2020-04-27 DIAGNOSIS — Z7901 Long term (current) use of anticoagulants: Secondary | ICD-10-CM

## 2020-04-27 DIAGNOSIS — J9691 Respiratory failure, unspecified with hypoxia: Secondary | ICD-10-CM | POA: Diagnosis present

## 2020-04-27 DIAGNOSIS — Z86718 Personal history of other venous thrombosis and embolism: Secondary | ICD-10-CM

## 2020-04-27 DIAGNOSIS — I10 Essential (primary) hypertension: Secondary | ICD-10-CM | POA: Diagnosis present

## 2020-04-27 DIAGNOSIS — Z8249 Family history of ischemic heart disease and other diseases of the circulatory system: Secondary | ICD-10-CM

## 2020-04-27 DIAGNOSIS — M48061 Spinal stenosis, lumbar region without neurogenic claudication: Secondary | ICD-10-CM | POA: Diagnosis present

## 2020-04-27 DIAGNOSIS — K567 Ileus, unspecified: Secondary | ICD-10-CM | POA: Diagnosis not present

## 2020-04-27 DIAGNOSIS — J90 Pleural effusion, not elsewhere classified: Secondary | ICD-10-CM

## 2020-04-27 DIAGNOSIS — I110 Hypertensive heart disease with heart failure: Secondary | ICD-10-CM | POA: Diagnosis present

## 2020-04-27 DIAGNOSIS — G4733 Obstructive sleep apnea (adult) (pediatric): Secondary | ICD-10-CM | POA: Diagnosis present

## 2020-04-27 HISTORY — PX: IABP INSERTION: CATH118242

## 2020-04-27 HISTORY — PX: RIGHT/LEFT HEART CATH AND CORONARY ANGIOGRAPHY: CATH118266

## 2020-04-27 LAB — LIPID PANEL
Cholesterol: 280 mg/dL — ABNORMAL HIGH (ref 0–200)
HDL: 57 mg/dL (ref >40)
Total CHOL/HDL Ratio: 4.9 RATIO
Triglycerides: 149 mg/dL (ref <150)
VLDL: 30 mg/dL (ref 0–40)

## 2020-04-27 LAB — CBC WITH DIFFERENTIAL/PLATELET
Abs Immature Granulocytes: 0.06 10*3/uL (ref 0.00–0.07)
Basophils Absolute: 0 10*3/uL (ref 0.0–0.1)
Basophils Relative: 0 %
Eosinophils Absolute: 0 10*3/uL (ref 0.0–0.5)
Eosinophils Relative: 0 %
HCT: 47.5 % (ref 39.0–52.0)
Hemoglobin: 16.4 g/dL (ref 13.0–17.0)
Immature Granulocytes: 0 %
Lymphocytes Relative: 6 %
Lymphs Abs: 0.9 10*3/uL (ref 0.7–4.0)
MCH: 30.3 pg (ref 26.0–34.0)
MCHC: 34.5 g/dL (ref 30.0–36.0)
MCV: 87.8 fL (ref 80.0–100.0)
Monocytes Absolute: 0.5 10*3/uL (ref 0.1–1.0)
Monocytes Relative: 3 %
Neutro Abs: 13.5 10*3/uL — ABNORMAL HIGH (ref 1.7–7.7)
Neutrophils Relative %: 91 %
Platelets: 327 10*3/uL (ref 150–400)
RBC: 5.41 MIL/uL (ref 4.22–5.81)
RDW: 12.7 % (ref 11.5–15.5)
WBC: 15 10*3/uL — ABNORMAL HIGH (ref 4.0–10.5)
nRBC: 0 % (ref 0.0–0.2)

## 2020-04-27 LAB — COMPREHENSIVE METABOLIC PANEL
ALT: 30 U/L (ref 0–44)
AST: 53 U/L — ABNORMAL HIGH (ref 15–41)
Albumin: 4.5 g/dL (ref 3.5–5.0)
Alkaline Phosphatase: 67 U/L (ref 38–126)
Anion gap: 15 (ref 5–15)
BUN: 15 mg/dL (ref 8–23)
CO2: 22 mmol/L (ref 22–32)
Calcium: 9.5 mg/dL (ref 8.9–10.3)
Chloride: 99 mmol/L (ref 98–111)
Creatinine, Ser: 1.11 mg/dL (ref 0.61–1.24)
GFR calc Af Amer: >60 mL/min (ref >60)
GFR calc non Af Amer: >60 mL/min (ref >60)
Glucose, Bld: 172 mg/dL — ABNORMAL HIGH (ref 70–99)
Potassium: 3.5 mmol/L (ref 3.5–5.1)
Sodium: 136 mmol/L (ref 135–145)
Total Bilirubin: 1 mg/dL (ref 0.3–1.2)
Total Protein: 8.1 g/dL (ref 6.5–8.1)

## 2020-04-27 LAB — APTT: aPTT: 26 seconds (ref 24–36)

## 2020-04-27 LAB — TROPONIN I (HIGH SENSITIVITY): Troponin I (High Sensitivity): 1614 ng/L (ref <18)

## 2020-04-27 LAB — HEMOGLOBIN A1C
Hgb A1c MFr Bld: 5.7 % — ABNORMAL HIGH (ref 4.8–5.6)
Mean Plasma Glucose: 116.89 mg/dL

## 2020-04-27 LAB — PROTIME-INR
INR: 0.9 (ref 0.8–1.2)
Prothrombin Time: 11.7 seconds (ref 11.4–15.2)

## 2020-04-27 LAB — SARS CORONAVIRUS 2 BY RT PCR (HOSPITAL ORDER, PERFORMED IN ~~LOC~~ HOSPITAL LAB): SARS Coronavirus 2: NEGATIVE

## 2020-04-27 LAB — BRAIN NATRIURETIC PEPTIDE: B Natriuretic Peptide: 103.3 pg/mL — ABNORMAL HIGH (ref 0.0–100.0)

## 2020-04-27 LAB — CBG MONITORING, ED: Glucose-Capillary: 160 mg/dL — ABNORMAL HIGH (ref 70–99)

## 2020-04-27 SURGERY — IABP INSERTION
Anesthesia: LOCAL

## 2020-04-27 MED ORDER — SODIUM CHLORIDE 0.9 % IV SOLN
INTRAVENOUS | Status: AC | PRN
  Administered 2020-04-27: 10 mL/h via INTRAVENOUS

## 2020-04-27 MED ORDER — SODIUM CHLORIDE 0.9 % IV SOLN: INTRAVENOUS | Status: DC

## 2020-04-27 MED ORDER — HEPARIN SODIUM (PORCINE) 5000 UNIT/ML IJ SOLN
4000.0000 [IU] | Freq: Once | INTRAMUSCULAR | Status: AC
  Administered 2020-04-27: 4000 [IU] via INTRAVENOUS
  Filled 2020-04-27: qty 1

## 2020-04-27 MED ORDER — SODIUM CHLORIDE 0.9 % IV SOLN
INTRAVENOUS | Status: AC | PRN
  Administered 2020-04-27: 3 mL/h via INTRAVENOUS

## 2020-04-27 MED ORDER — FUROSEMIDE 10 MG/ML IJ SOLN
INTRAMUSCULAR | Status: AC
  Filled 2020-04-27: qty 4

## 2020-04-27 MED ORDER — LIDOCAINE HCL (PF) 1 % IJ SOLN
INTRAMUSCULAR | Status: DC | PRN
  Administered 2020-04-27: 2 mL
  Administered 2020-04-27: 20 mL

## 2020-04-27 MED ORDER — IOHEXOL 350 MG/ML SOLN
INTRAVENOUS | Status: AC
  Filled 2020-04-27: qty 1

## 2020-04-27 MED ORDER — ASPIRIN 81 MG PO CHEW
162.0000 mg | CHEWABLE_TABLET | Freq: Once | ORAL | Status: AC
  Administered 2020-04-27: 162 mg via ORAL
  Filled 2020-04-27: qty 2

## 2020-04-27 MED ORDER — NITROGLYCERIN 1 MG/10 ML FOR IR/CATH LAB
INTRA_ARTERIAL | Status: AC
  Filled 2020-04-27: qty 10

## 2020-04-27 MED ORDER — VERAPAMIL HCL 2.5 MG/ML IV SOLN
INTRAVENOUS | Status: DC | PRN
  Administered 2020-04-27: 10 mL via INTRA_ARTERIAL

## 2020-04-27 MED ORDER — HEPARIN (PORCINE) IN NACL 1000-0.9 UT/500ML-% IV SOLN
INTRAVENOUS | Status: AC
  Filled 2020-04-27: qty 1000

## 2020-04-27 MED ORDER — LIDOCAINE HCL (PF) 1 % IJ SOLN
INTRAMUSCULAR | Status: AC
  Filled 2020-04-27: qty 30

## 2020-04-27 MED ORDER — HEPARIN (PORCINE) 25000 UT/250ML-% IV SOLN
INTRAVENOUS | Status: AC
  Filled 2020-04-27: qty 250

## 2020-04-27 MED ORDER — VERAPAMIL HCL 2.5 MG/ML IV SOLN
INTRAVENOUS | Status: AC
  Filled 2020-04-27: qty 2

## 2020-04-27 MED ORDER — HEPARIN (PORCINE) IN NACL 1000-0.9 UT/500ML-% IV SOLN
INTRAVENOUS | Status: DC | PRN
  Administered 2020-04-27 (×2): 500 mL

## 2020-04-27 MED ORDER — IOHEXOL 350 MG/ML SOLN
INTRAVENOUS | Status: DC | PRN
  Administered 2020-04-27: 65 mL via INTRA_ARTERIAL

## 2020-04-27 MED ORDER — FUROSEMIDE 10 MG/ML IJ SOLN
INTRAMUSCULAR | Status: DC | PRN
  Administered 2020-04-27: 60 mg via INTRAVENOUS
  Administered 2020-04-27: 20 mg via INTRAVENOUS

## 2020-04-27 MED ORDER — HEPARIN SODIUM (PORCINE) 1000 UNIT/ML IJ SOLN
INTRAMUSCULAR | Status: AC
  Filled 2020-04-27: qty 1

## 2020-04-27 SURGICAL SUPPLY — 20 items
BALLN IABP SENSA PLUS 8F 50CC (BALLOONS) ×2
BALLOON IABP SENS PLUS 8F 50CC (BALLOONS) IMPLANT
CATH 5FR JL3.5 JR4 ANG PIG MP (CATHETERS) ×1 IMPLANT
CATH SWAN GANZ VIP 7.5F (CATHETERS) ×1 IMPLANT
CATH VISTA GUIDE 6FR XBLAD3.5 (CATHETERS) ×1 IMPLANT
ELECT DEFIB PAD ADLT CADENCE (PAD) ×1 IMPLANT
GLIDESHEATH SLEND SS 6F .021 (SHEATH) ×1 IMPLANT
GUIDEWIRE INQWIRE 1.5J.035X260 (WIRE) IMPLANT
INQWIRE 1.5J .035X260CM (WIRE) ×2
KIT ENCORE 26 ADVANTAGE (KITS) ×1 IMPLANT
KIT HEART LEFT (KITS) ×2 IMPLANT
PACK CARDIAC CATHETERIZATION (CUSTOM PROCEDURE TRAY) ×2 IMPLANT
SHEATH PINNACLE 5F 10CM (SHEATH) ×1 IMPLANT
SHEATH PINNACLE 7F 10CM (SHEATH) ×1 IMPLANT
SHEATH PINNACLE 8F 10CM (SHEATH) ×1 IMPLANT
SHEATH PROBE COVER 6X72 (BAG) ×1 IMPLANT
SLEEVE REPOSITIONING LENGTH 30 (MISCELLANEOUS) ×1 IMPLANT
TRANSDUCER W/STOPCOCK (MISCELLANEOUS) ×2 IMPLANT
TUBING CIL FLEX 10 FLL-RA (TUBING) ×2 IMPLANT
WIRE EMERALD 3MM-J .035X150CM (WIRE) ×1 IMPLANT

## 2020-04-27 NOTE — Progress Notes (Signed)
Increased patient to 6 liter nasal cannula and added a humidity bottle.  Patient's SPO2 increased and remains between 97% and 95%.  RT will continue to monitor.

## 2020-04-27 NOTE — Progress Notes (Signed)
Orthopedic Tech Progress Note Patient Details:  Joshua Mack [birth year] [id number]  Ortho Devices Type of Ortho Device: Knee Immobilizer Ortho Device/Splint Location: RLE Ortho Device/Splint Interventions: Application   Post Interventions Patient Tolerated: Well Instructions Provided: Adjustment of device   Miia Blanks E Lucio Litsey 04/27/2020, 11:56 PM

## 2020-04-27 NOTE — ED Notes (Signed)
Multiple attempts to reach cath lab at [phone] and [phone] unanswered.

## 2020-04-27 NOTE — ED Notes (Signed)
Heparin gtt given to [facility] per request per protocol

## 2020-04-27 NOTE — ED Notes (Signed)
RT at bedside.

## 2020-04-27 NOTE — ED Notes (Signed)
Date and time results received: 04/27/20 2119  Test: troponin Critical Value: 1614  Name of Provider Notified: Dr. Stevie Kern  Orders Received? Or Actions Taken?: no new orders

## 2020-04-27 NOTE — ED Notes (Signed)
States," I feel 100% better" Laughing with wife

## 2020-04-27 NOTE — ED Notes (Signed)
Pt on monitor 

## 2020-04-27 NOTE — ED Notes (Signed)
Spoke to [employer], Consulting civil engineer at [facility] ED

## 2020-04-27 NOTE — H&P (Signed)
Cardiology Admission History and Physical:   Patient ID: Joshua Mack MRN: [id number]; DOB: 11-12-1941   Admission date: 04/27/2020  Primary Care Provider: Johny Blamer, MD Primary Cardiologist: No primary care provider on file.   Primary Electrophysiologist:  None    Chief Complaint:  Shortness of breath  Patient Profile:   Joshua Mack is a 79 y.o. male with past medical history significant for HTN, HLD, spinal stenosis and prior DVT who presents with shortness of breath found to have a STEMI.   History of Present Illness:   Mr. Main states that he had chest discomfort on Friday (5/7) that resolved on its own after about an hour. This morning, he woke up with more discomfort in his chest and shortness of breath. He took aspirin and ibuprofen and it resolved. However, he continued to feel off throughout most of the day with diarrhea and sweating. He became more short of breath and presented to the ER. On arrival, his ECG showed ST elevation and the cath lab was activated. On arrival to [facility], he denied any pain but has some ongoing shortness of breath.  He states that he feels the best he has. He denies any lightheadedness, dizziness or recent lower leg swelling.    Past Medical History:  Diagnosis Date  . High cholesterol   . Hypertension   . Sleep apnea     Past Surgical History:  Procedure Laterality Date  . LUMBAR LAMINECTOMY/DECOMPRESSION MICRODISCECTOMY N/A 05/03/2017   Procedure: Microlumbar decompression L4-5, L3-4,  L5-S1 WITH LATERAL AUTOGRAFT FUSION;  Surgeon: Jene Every, MD;  Location: [facility] ORS;  Service: Orthopedics;  Laterality: N/A;     Medications Prior to Admission: Prior to Admission medications   Medication Sig Start Date End Date Taking? Authorizing Provider  acetaminophen (TYLENOL) 500 MG tablet Take 1,000 mg by mouth 2 (two) times daily.   Yes [provider]  amLODipine (NORVASC) 5 MG tablet Take 10 mg by mouth daily.    Yes [provider]  docusate sodium (COLACE) 100 MG capsule Take 1 capsule (100 mg total) by mouth 2 (two) times daily as needed for mild constipation. 05/03/17  Yes Jene Every, MD  Melatonin 5 MG TABS Take 5 mg by mouth at bedtime.   Yes [provider]  Misc Natural Products (OSTEO BI-FLEX ADV JOINT SHIELD) TABS Take 1 tablet by mouth daily.   Yes [provider]  Multiple Vitamin (MULTIVITAMIN WITH MINERALS) TABS tablet Take 1 tablet by mouth daily.   Yes [provider]  Omega-3 Krill Oil 500 MG CAPS Take 500 mg by mouth daily.   Yes [provider]  Resveratrol 250 MG CAPS Take 250 mg by mouth daily.   Yes [provider]  Turmeric Curcumin 500 MG CAPS Take 500 mg by mouth daily.   Yes [provider]  Ubiquinone (ULTRA COQ10 PO) Take 1 capsule by mouth daily.   Yes [provider]  Bioflavonoid Products (ESTER-C) TABS Take 1 tablet by mouth daily.    [provider]  cholecalciferol (VITAMIN D) 1000 units tablet Take 1,000 Units by mouth daily.    [provider]  Chromium 1000 MCG TABS Take 1,000 mcg by mouth daily.    [provider]  Garlic 1000 MG CAPS Take 3,000 mg by mouth daily.    [provider]  L-Arginine 500 MG CAPS Take 500 mg by mouth daily.    [provider]  Misc Natural Products (URINOZINC PLUS) TABS  Take 1 tablet by mouth daily.    [provider]  Plant Sterols and Stanols (CHOLEST OFF PO) Take 1 capsule by mouth daily.    [provider]  polyethylene glycol (MIRALAX / GLYCOLAX) packet Take 17 g by mouth daily. 05/03/17   Susa Day, MD     Allergies:    Allergies  Allergen Reactions  . Fentanyl Other (See Comments)    Had a reaction during a colonoscopy in the past. He was told it required Benadryl.  . Midazolam Other (See Comments)    Had a reaction during a colonoscopy in the past. He was told it required Benadryl.  . Amoxicillin Rash      Has patient had a PCN reaction causing immediate rash, facial/tongue/throat swelling, SOB or lightheadedness with hypotension: No Has patient had a PCN reaction causing severe rash involving mucus membranes or skin necrosis: No Has patient had a PCN reaction that required hospitalization: No Has patient had a PCN reaction occurring within the last 10 years: No If all of the above answers are "NO", then may proceed with Cephalosporin use.   . Tavist Nd [Loratadine] Rash    Social History:   Social History   Socioeconomic History  . Marital status: Married    Spouse name: Not on file  . Number of children: Not on file  . Years of education: Not on file  . Highest education level: Not on file  Occupational History  . Not on file  Tobacco Use  . Smoking status: Never Smoker  . Smokeless tobacco: Never Used  Substance and Sexual Activity  . Alcohol use: No  . Drug use: No  . Sexual activity: Not on file  Other Topics Concern  . Not on file  Social History Narrative  . Not on file   Social Determinants of Health   Financial Resource Strain:   . Difficulty of Paying Living Expenses:   Food Insecurity:   . Worried About Charity fundraiser in the Last Year:   . Arboriculturist in the Last Year:   Transportation Needs:   . Film/video editor (Medical):   Marland Kitchen Lack of Transportation (Non-Medical):   Physical Activity:   . Days of Exercise per Week:   . Minutes of Exercise per Session:   Stress:   . Feeling of Stress :   Social Connections:   . Frequency of Communication with Friends and Family:   . Frequency of Social Gatherings with Friends and Family:   . Attends Religious Services:   . Active Member of Clubs or Organizations:   . Attends Archivist Meetings:   Marland Kitchen Marital Status:   Intimate Partner Violence:   . Fear of Current or Ex-Partner:   . Emotionally Abused:   Marland Kitchen Physically Abused:   . Sexually Abused:     Family History:   Father- CHF  ROS:   Please see the history of present illness.  All other ROS reviewed and negative.     Physical Exam/Data:   Vitals:   04/27/20 2100 04/27/20 2111 04/27/20 2115 04/27/20 2159  BP: 139/88  (!) 141/88   Pulse: 95 87 86   Resp: (!) 22 20 18    Temp:      TempSrc:      SpO2: 90% 95% 95% 92%  Weight:      Height:       No intake or output data in the 24 hours ending 04/27/20 2228 Last 3 Weights  04/27/2020 04/27/2020 05/12/2017  Weight (lbs) 209 lb 6.4 oz 210 lb 194 lb  Weight (kg) 94.983 kg 95.255 kg 87.998 kg     Body mass index is 30.05 kg/m.  General:  Well nourished, well developed, in no acute distress  HEENT: normal Vascular: No carotid bruits; FA pulses 2+ bilaterally without bruits  Cardiac:  normal S1, S2; tachycardic, regular rhythm. No murmurs, rubs or gallops  Lungs:  Mildly tachypneic on nasal canula oxygen. Clear to auscultation anteriorly  Abd: soft, nontender, no hepatomegaly  Ext: no edema Musculoskeletal:  No deformities, BUE and BLE strength normal and equal Skin: warm and dry  Neuro:  CNs 2-12 intact, no focal abnormalities noted Psych:  Normal affect    EKG:  The ECG that was done was personally reviewed and demonstrates ST elevation in V2-V4  Relevant CV Studies: Echo 05/07/17 - Left ventricle: The cavity size was normal. Systolic function was  normal. The estimated ejection fraction was in the range of 55%  to 60%. Wall motion was normal; there were no regional wall  motion abnormalities. Left ventricular diastolic function  parameters were normal.  - Mitral valve: There was mild regurgitation.   Laboratory Data:  High Sensitivity Troponin:   Recent Labs  Lab 04/27/20 2052  TROPONINIHS 1,614*      Chemistry Recent Labs  Lab 04/27/20 2052  NA 136  K 3.5  CL 99  CO2 22  GLUCOSE 172*  BUN 15  CREATININE 1.11  CALCIUM 9.5  GFRNONAA >60  GFRAA >60  ANIONGAP 15    Recent Labs  Lab 04/27/20 2052  PROT 8.1  ALBUMIN 4.5  AST 53*   ALT 30  ALKPHOS 67  BILITOT 1.0   Hematology Recent Labs  Lab 04/27/20 2052  WBC 15.0*  RBC 5.41  HGB 16.4  HCT 47.5  MCV 87.8  MCH 30.3  MCHC 34.5  RDW 12.7  PLT 327   BNP Recent Labs  Lab 04/27/20 2052  BNP 103.3*    DDimer No results for input(s): DDIMER in the last 168 hours.   Radiology/Studies:  DG Chest Portable 1 View  Result Date: 04/27/2020 CLINICAL DATA:  Shortness of breath. EXAM: PORTABLE CHEST 1 VIEW COMPARISON:  May 31, 2017 FINDINGS: Moderate severity diffusely increased interstitial lung markings are seen, bilaterally. This represents a new finding when compared to the prior study. There is no evidence of a pleural effusion or pneumothorax. The heart size and mediastinal contours are within normal limits. Degenerative changes seen throughout the thoracic spine. 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 A superimposed component of acute interstitial edema cannot be excluded. Electronically Signed   By: Aram Candela M.D.   On: 04/27/2020 21:07   { TIMI Risk Score for ST  Elevation MI:   The patient's TIMI risk score is 10, which indicates a 35.9% risk of all cause mortality at 30 days.    Assessment and Plan:   Berlyn Malina is a 79y/o with history of HTN, HLD and spinal stenosis who presents with a STEMI.  STEMI- Cath shows severe 3-vessel coronary disease with collaterals. As he is chest pain free, we discussed his case with the on-call CT surgeon, Dr. Cliffton Asters, for possible CABG surgery. Given his ST elevation, an IABP was placed to assist with coronary perfusion. His LVEDP was also elevated at and he was diuresed.  - S/p aspirin 324mg   - Continue aspirin 81mg  daily - S/p 80mg  IV lasix in cath lab - IABP  for coronary perfusion - Echo ordered - Heparin ACS nomogram - Start lipitor 40mg  daily  - NPO at midnight in case of intervention - CT surgery to evaluate for CABG - SL nitro PRN for chest pain - ECG  daily and PRN for chest pain - Trend troponins   Hypertension- On norvasc at home - Hold amlodipine  Hyperlipidemia - Check lipid panel - Start lipitor 40mg    Severity of Illness: The appropriate patient status for this patient is INPATIENT. Inpatient status is judged to be reasonable and necessary in order to provide the required intensity of service to ensure the patient's safety. The patient's presenting symptoms, physical exam findings, and initial radiographic and laboratory data in the context of their chronic comorbidities is felt to place them at high risk for further clinical deterioration. Furthermore, it is not anticipated that the patient will be medically stable for discharge from the hospital within 2 midnights of admission. The following factors support the patient status of inpatient.   " The patient's presenting symptoms include shortness of breath. " The worrisome physical exam findings include dyspnea. " The initial radiographic and laboratory data are worrisome because of severe three vessel coronary disease. " The chronic co-morbidities include hypertension and hyperlipidemia.   * I certify that at the point of admission it is my clinical judgment that the patient will require inpatient hospital care spanning beyond 2 midnights from the point of admission due to high intensity of service, high risk for further deterioration and high frequency of surveillance required.*    For questions or updates, please contact [facility] Please consult [email or web address] for contact info under        Signed, , MD  04/27/2020 10:28 PM

## 2020-04-27 NOTE — ED Triage Notes (Signed)
Pt c/o SOB x 1 day .  

## 2020-04-27 NOTE — Progress Notes (Signed)
Patient's SPO2 drop to 87% while on room air.  Placed patient on 2 liter nasal cannula.  Patient's SPO2 increased to 95% on 2 liter nasal cannula.  RT will continue to monitor.

## 2020-04-27 NOTE — ED Provider Notes (Signed)
[facility] EMERGENCY DEPARTMENT Provider Note   CSN: [id number] Arrival date & time: 04/27/20  2025     History Chief Complaint  Patient presents with  . Shortness of Breath    Joshua Mack is a 79 y.o. male.  Presents to ER with shortness of breath.  History limited due to acuity.  Level 5 caveat.  Patient reports that today he has been feeling increasingly short of breath, worse with exertion.  Started around 10 AM.  States that he has been having pain "all over", discomfort sensation across chest and down bilateral arms.  Denies current pain.  Took 2 baby aspirin's soon before coming to hospital.  No change in symptoms.  Reports a couple days ago he had an episode of similar symptoms but had worse chest pain but did not seek care at the time.  Denies prior history of CAD.  Has history of hypertension, hyperlipidemia.  Additional hx obtained via wife.   HPI     Past Medical History:  Diagnosis Date  . High cholesterol   . Hypertension   . Sleep apnea     Patient Active Problem List   Diagnosis Date Noted  . DVT (deep venous thrombosis) (HCC) 05/08/2017  . OSA (obstructive sleep apnea) 05/08/2017  . Hyponatremia 05/07/2017  . Respiratory failure with hypoxia (HCC) 05/07/2017  . Spinal stenosis at L4-L5 level 05/03/2017    Past Surgical History:  Procedure Laterality Date  . LUMBAR LAMINECTOMY/DECOMPRESSION MICRODISCECTOMY N/A 05/03/2017   Procedure: Microlumbar decompression L4-5, L3-4,  L5-S1 WITH LATERAL AUTOGRAFT FUSION;  Surgeon: Jene Every, MD;  Location: [facility];  Service: Orthopedics;  Laterality: N/A;       No family history on file.  Social History   Tobacco Use  . Smoking status: Never Smoker  . Smokeless tobacco: Never Used  Substance Use Topics  . Alcohol use: No  . Drug use: No    Home Medications Prior to Admission medications   Medication Sig Start Date End Date Taking? Authorizing Provider  Melatonin 5 MG TABS Take 5  mg by mouth at bedtime.   Yes [provider]  Misc Natural Products (OSTEO BI-FLEX ADV JOINT SHIELD) TABS Take 1 tablet by mouth daily.   Yes [provider]  Multiple Vitamin (MULTIVITAMIN WITH MINERALS) TABS tablet Take 1 tablet by mouth daily.   Yes [provider]  Omega-3 Krill Oil 500 MG CAPS Take 500 mg by mouth daily.   Yes [provider]  Resveratrol 250 MG CAPS Take 250 mg by mouth daily.   Yes [provider]  Turmeric Curcumin 500 MG CAPS Take 500 mg by mouth daily.   Yes [provider]  Ubiquinone (ULTRA COQ10 PO) Take 1 capsule by mouth daily.   Yes [provider]  acetaminophen (TYLENOL) 500 MG tablet Take 1,000 mg by mouth 2 (two) times daily.    [provider]  amLODipine (NORVASC) 5 MG tablet Take 10 mg by mouth daily.     [provider]  APPLE CIDER VINEGAR PO Take 1 capsule by mouth daily.    [provider]  Bioflavonoid Products (ESTER-C) TABS Take 1 tablet by mouth daily.    [provider]  cholecalciferol (VITAMIN D) 1000 units tablet Take 1,000 Units by mouth daily.    [provider]  Chromium 1000 MCG TABS Take 1,000 mcg by mouth daily.    [provider]  docusate sodium (COLACE) 100 MG capsule Take 1 capsule (  100 mg total) by mouth 2 (two) times daily as needed for mild constipation. 05/03/17   Jene Every, MD  Garlic 1000 MG CAPS Take 3,000 mg by mouth daily.    [provider]  L-Arginine 500 MG CAPS Take 500 mg by mouth daily.    [provider]  losartan-hydrochlorothiazide (HYZAAR) 100-25 MG tablet Take 1 tablet by mouth daily. 03/30/17   [provider]  Misc Natural Products (URINOZINC PLUS) TABS Take 1 tablet by mouth daily.    [provider]  oxyCODONE-acetaminophen (PERCOCET) 5-325 MG tablet Take 1-2 tablets by mouth every 4 (four) hours as needed for severe pain. 05/03/17   Jene Every, MD  Plant  Sterols and Stanols (CHOLEST OFF PO) Take 1 capsule by mouth daily.    [provider]  polyethylene glycol (MIRALAX / GLYCOLAX) packet Take 17 g by mouth daily. 05/03/17   Jene Every, MD  Rivaroxaban (XARELTO STARTER PACK) 15 & 20 MG TBPK Take as directed on package: Start with one 15mg  tablet by mouth twice a day with food. On Day 22, switch to one 20mg  tablet once a day with food. 05/09/17   , MD  tamsulosin (FLOMAX) 0.4 MG CAPS capsule Take 1 capsule (0.4 mg total) by mouth daily. 05/05/17   Leatha Gilding, MD    Allergies    Fentanyl, Midazolam, Amoxicillin, and Tavist nd [loratadine]  Review of Systems   Review of Systems  Unable to perform ROS: Acuity of condition    Physical Exam Updated Vital Signs BP (!) 141/88 (BP Location: Right Arm)   Pulse 86   Temp 98.8 F (37.1 C) (Oral)   Resp 18   Ht 5\' 10"  (1.778 m)   Wt 95 kg   SpO2 95%   BMI 30.05 kg/m   Physical Exam Vitals and nursing note reviewed.  Constitutional:      Appearance: He is well-developed.  HENT:     Head: Normocephalic and atraumatic.  Eyes:     Conjunctiva/sclera: Conjunctivae normal.  Cardiovascular:     Rate and Rhythm: Normal rate and regular rhythm.     Heart sounds: No murmur.  Pulmonary:     Comments: Bilateral crackles, speaks in full sentences, mild tachypnea not in frank distress Abdominal:     Palpations: Abdomen is soft.     Tenderness: There is no abdominal tenderness.  Musculoskeletal:     Cervical back: Neck supple.     Right lower leg: No edema.     Left lower leg: No edema.  Skin:    General: Skin is warm and dry.  Neurological:     General: No focal deficit present.     Mental Status: He is alert.  Psychiatric:        Mood and Affect: Mood normal.        Behavior: Behavior normal.     ED Results / Procedures / Treatments   Labs (all labs ordered are listed, but only abnormal results are displayed) Labs Reviewed  CBC WITH  DIFFERENTIAL/PLATELET - Abnormal; Notable for the following components:      Result Value   WBC 15.0 (*)    Neutro Abs 13.5 (*)    All other components within normal limits  COMPREHENSIVE METABOLIC PANEL - Abnormal; Notable for the following components:   Glucose, Bld 172 (*)    AST 53 (*)    All other components within normal limits  BRAIN NATRIURETIC PEPTIDE - Abnormal; Notable for the following components:  B Natriuretic Peptide 103.3 (*)    All other components within normal limits  CBG MONITORING, ED - Abnormal; Notable for the following components:   Glucose-Capillary 160 (*)    All other components within normal limits  TROPONIN I (HIGH SENSITIVITY) - Abnormal; Notable for the following components:   Troponin I (High Sensitivity) 1,614 (*)    All other components within normal limits  SARS CORONAVIRUS 2 BY RT PCR (HOSPITAL ORDER, PERFORMED IN [place])  PROTIME-INR  APTT  LIPID PANEL  HEMOGLOBIN A1C    EKG EKG Interpretation  Date/Time:  Monday Apr 27 2020 20:31:10 EDT Ventricular Rate:  89 PR Interval:    QRS Duration: 109 QT Interval:  363 QTC Calculation: 442 R Axis:   -95 Text Interpretation: Sinus rhythm Atrial premature complex Inferior infarct, old Probable anterolateral infarct, acute Artifact in lead(s) V4 V5 >>> Acute MI <<< Confirmed by Marianna Fuss ([id number]) on 04/27/2020 9:31:22 PM   Radiology DG Chest Portable 1 View  Result Date: 04/27/2020 CLINICAL DATA:  Shortness of breath. EXAM: PORTABLE CHEST 1 VIEW COMPARISON:  May 31, 2017 FINDINGS: Moderate severity diffusely increased interstitial lung markings are seen, bilaterally. This represents a new finding when compared to the prior study. There is no evidence of a pleural effusion or pneumothorax. The heart size and mediastinal contours are within normal limits. Degenerative changes seen throughout the thoracic spine. IMPRESSION: Findings consistent with chronic interstitial lung  disease. A superimposed component of acute interstitial edema cannot be excluded. Electronically Signed   By: Aram Candela M.D.   On: 04/27/2020 21:07    Procedures .Critical Care Performed by: Milagros Loll, MD Authorized by: Milagros Loll, MD   Critical care provider statement:    Critical care time (minutes):  40   Critical care was time spent personally by me on the following activities:  Discussions with consultants, evaluation of patient's response to treatment, examination of patient, ordering and performing treatments and interventions, ordering and review of laboratory studies, ordering and review of radiographic studies, pulse oximetry, re-evaluation of patient's condition, obtaining history from patient or surrogate and review of old charts   (including critical care time)  Medications Ordered in ED Medications  0.9 %  sodium chloride infusion ( Intravenous New Bag/Given 04/27/20 2102)  heparin 25000-0.45 UT/250ML-% infusion (has no administration in time range)  aspirin chewable tablet 162 mg (162 mg Oral Given 04/27/20 2055)  heparin injection 4,000 Units (4,000 Units Intravenous Given 04/27/20 2054)    ED Course  I have reviewed the triage vital signs and the nursing notes.  Pertinent labs & imaging results that were available during my care of the patient were reviewed by me and considered in my medical decision making (see chart for details).  Clinical Course as of Apr 27 2130  Mon Apr 27, 2020  2040 I was handed ekg at 2040, immediately assessed patient   [RD]  2043 Asked Charge RN, Diplomatic Services operational officer to call Code STEMI   [RD]  2050 Went back to bedside to finish assessment   [RD]  2055 Talked to STEMI doc - [place] will accept as STEMI, [facility] sending truck   [RD]  2118 [facility] at bedside for transport   [RD]    Clinical Course User Index [RD] Milagros Loll, MD   MDM Rules/Calculators/A&P                      79 year old male presenting to ER  with chief  complaint shortness of breath.  EKG demonstrated ST elevations in anterior leads with reciprocal depressions.  Called code STEMI.  [place] agreed.  Transported via [facility].  Patient took 162 mg aspirin prior to arrival, gave additional 162 mg.  Gave heparin bolus.  Hemodynamically patient was stable except for mild hypoxia, suspect patient may be having a degree of heart failure.  Placed on 2 L nasal cannula and was doing well from respiratory standpoint at time of transport. Wife at bedside, updated throughout stay.   [place] accepting at [facility] for Acute STEMI.   Final Clinical Impression(s) / ED Diagnoses Final diagnoses:  ST elevation myocardial infarction (STEMI), unspecified artery [facility])    Rx / DC Orders ED Discharge Orders    None       Lucrezia Starch, MD 04/27/20 2135

## 2020-04-27 NOTE — ED Notes (Signed)
Family at bedside.ED Provider at bedside. 

## 2020-04-28 ENCOUNTER — Inpatient Hospital Stay (HOSPITAL_COMMUNITY): Payer: Commercial Managed Care - PPO

## 2020-04-28 ENCOUNTER — Inpatient Hospital Stay (HOSPITAL_COMMUNITY): Payer: Commercial Managed Care - PPO | Admitting: Anesthesiology

## 2020-04-28 ENCOUNTER — Encounter (HOSPITAL_COMMUNITY)
Admission: EM | Disposition: A | Discharge status: Home / Self Care | Payer: Self-pay | Source: Home / Self Care | Attending: Thoracic Surgery (Cardiothoracic Vascular Surgery)

## 2020-04-28 ENCOUNTER — Other Ambulatory Visit: Payer: Self-pay

## 2020-04-28 DIAGNOSIS — I213 ST elevation (STEMI) myocardial infarction of unspecified site: Secondary | ICD-10-CM

## 2020-04-28 DIAGNOSIS — Z951 Presence of aortocoronary bypass graft: Secondary | ICD-10-CM

## 2020-04-28 DIAGNOSIS — G4733 Obstructive sleep apnea (adult) (pediatric): Secondary | ICD-10-CM

## 2020-04-28 DIAGNOSIS — I2510 Atherosclerotic heart disease of native coronary artery without angina pectoris: Secondary | ICD-10-CM | POA: Diagnosis present

## 2020-04-28 DIAGNOSIS — I5041 Acute combined systolic (congestive) and diastolic (congestive) heart failure: Secondary | ICD-10-CM

## 2020-04-28 DIAGNOSIS — I10 Essential (primary) hypertension: Secondary | ICD-10-CM

## 2020-04-28 DIAGNOSIS — I25110 Atherosclerotic heart disease of native coronary artery with unstable angina pectoris: Secondary | ICD-10-CM

## 2020-04-28 DIAGNOSIS — I2109 ST elevation (STEMI) myocardial infarction involving other coronary artery of anterior wall: Principal | ICD-10-CM

## 2020-04-28 DIAGNOSIS — J9601 Acute respiratory failure with hypoxia: Secondary | ICD-10-CM

## 2020-04-28 HISTORY — PX: CORONARY ARTERY BYPASS GRAFT: SHX141

## 2020-04-28 HISTORY — PX: ENDOVEIN HARVEST OF GREATER SAPHENOUS VEIN: SHX5059

## 2020-04-28 LAB — POCT I-STAT 7, (LYTES, BLD GAS, ICA,H+H)
Acid-Base Excess: 2 mmol/L (ref 0.0–2.0)
Acid-Base Excess: 0 mmol/L (ref 0.0–2.0)
Acid-Base Excess: 2 mmol/L (ref 0.0–2.0)
Acid-Base Excess: 2 mmol/L (ref 0.0–2.0)
Acid-base deficit: 2 mmol/L (ref 0.0–2.0)
Acid-base deficit: 1 mmol/L (ref 0.0–2.0)
Acid-base deficit: 2 mmol/L (ref 0.0–2.0)
Acid-base deficit: 6 mmol/L — ABNORMAL HIGH (ref 0.0–2.0)
Bicarbonate: 22.2 mmol/L (ref 20.0–28.0)
Bicarbonate: 25.5 mmol/L (ref 20.0–28.0)
Bicarbonate: 26.3 mmol/L (ref 20.0–28.0)
Bicarbonate: 27.3 mmol/L (ref 20.0–28.0)
Bicarbonate: 23.7 mmol/L (ref 20.0–28.0)
Bicarbonate: 25.8 mmol/L (ref 20.0–28.0)
Bicarbonate: 23.7 mmol/L (ref 20.0–28.0)
Bicarbonate: 21.2 mmol/L (ref 20.0–28.0)
Calcium, Ion: 1.24 mmol/L (ref 1.15–1.40)
Calcium, Ion: 1.17 mmol/L (ref 1.15–1.40)
Calcium, Ion: 1.17 mmol/L (ref 1.15–1.40)
Calcium, Ion: 1.03 mmol/L — ABNORMAL LOW (ref 1.15–1.40)
Calcium, Ion: 1.02 mmol/L — ABNORMAL LOW (ref 1.15–1.40)
Calcium, Ion: 1.2 mmol/L (ref 1.15–1.40)
Calcium, Ion: 1.24 mmol/L (ref 1.15–1.40)
Calcium, Ion: 1.15 mmol/L (ref 1.15–1.40)
HCT: 49 % (ref 39.0–52.0)
HCT: 44 % (ref 39.0–52.0)
HCT: 41 % (ref 39.0–52.0)
HCT: 30 % — ABNORMAL LOW (ref 39.0–52.0)
HCT: 26 % — ABNORMAL LOW (ref 39.0–52.0)
HCT: 28 % — ABNORMAL LOW (ref 39.0–52.0)
HCT: 24 % — ABNORMAL LOW (ref 39.0–52.0)
HCT: 25 % — ABNORMAL LOW (ref 39.0–52.0)
Hemoglobin: 16.7 g/dL (ref 13.0–17.0)
Hemoglobin: 15 g/dL (ref 13.0–17.0)
Hemoglobin: 13.9 g/dL (ref 13.0–17.0)
Hemoglobin: 10.2 g/dL — ABNORMAL LOW (ref 13.0–17.0)
Hemoglobin: 8.8 g/dL — ABNORMAL LOW (ref 13.0–17.0)
Hemoglobin: 9.5 g/dL — ABNORMAL LOW (ref 13.0–17.0)
Hemoglobin: 8.2 g/dL — ABNORMAL LOW (ref 13.0–17.0)
Hemoglobin: 8.5 g/dL — ABNORMAL LOW (ref 13.0–17.0)
O2 Saturation: 88 %
O2 Saturation: 98 %
O2 Saturation: 91 %
O2 Saturation: 100 %
O2 Saturation: 100 %
O2 Saturation: 94 %
O2 Saturation: 93 %
O2 Saturation: 89 %
Patient temperature: 37.4
Patient temperature: 35.6
Patient temperature: 36.4
Potassium: 3.3 mmol/L — ABNORMAL LOW (ref 3.5–5.1)
Potassium: 3 mmol/L — ABNORMAL LOW (ref 3.5–5.1)
Potassium: 3 mmol/L — ABNORMAL LOW (ref 3.5–5.1)
Potassium: 3.4 mmol/L — ABNORMAL LOW (ref 3.5–5.1)
Potassium: 3.4 mmol/L — ABNORMAL LOW (ref 3.5–5.1)
Potassium: 4 mmol/L (ref 3.5–5.1)
Potassium: 3.4 mmol/L — ABNORMAL LOW (ref 3.5–5.1)
Potassium: 3.9 mmol/L (ref 3.5–5.1)
Sodium: 136 mmol/L (ref 135–145)
Sodium: 137 mmol/L (ref 135–145)
Sodium: 139 mmol/L (ref 135–145)
Sodium: 138 mmol/L (ref 135–145)
Sodium: 137 mmol/L (ref 135–145)
Sodium: 139 mmol/L (ref 135–145)
Sodium: 141 mmol/L (ref 135–145)
Sodium: 141 mmol/L (ref 135–145)
TCO2: 23 mmol/L (ref 22–32)
TCO2: 27 mmol/L (ref 22–32)
TCO2: 28 mmol/L (ref 22–32)
TCO2: 29 mmol/L (ref 22–32)
TCO2: 25 mmol/L (ref 22–32)
TCO2: 27 mmol/L (ref 22–32)
TCO2: 25 mmol/L (ref 22–32)
TCO2: 23 mmol/L (ref 22–32)
pCO2 arterial: 34.7 mmHg (ref 32.0–48.0)
pCO2 arterial: 37.4 mmHg (ref 32.0–48.0)
pCO2 arterial: 46.4 mmHg (ref 32.0–48.0)
pCO2 arterial: 44.6 mmHg (ref 32.0–48.0)
pCO2 arterial: 37.1 mmHg (ref 32.0–48.0)
pCO2 arterial: 38.7 mmHg (ref 32.0–48.0)
pCO2 arterial: 39.1 mmHg (ref 32.0–48.0)
pCO2 arterial: 47.8 mmHg (ref 32.0–48.0)
pH, Arterial: 7.415 (ref 7.350–7.450)
pH, Arterial: 7.442 (ref 7.350–7.450)
pH, Arterial: 7.362 (ref 7.350–7.450)
pH, Arterial: 7.395 (ref 7.350–7.450)
pH, Arterial: 7.395 (ref 7.350–7.450)
pH, Arterial: 7.45 (ref 7.350–7.450)
pH, Arterial: 7.384 (ref 7.350–7.450)
pH, Arterial: 7.251 — ABNORMAL LOW (ref 7.350–7.450)
pO2, Arterial: 53 mmHg — ABNORMAL LOW (ref 83.0–108.0)
pO2, Arterial: 94 mmHg (ref 83.0–108.0)
pO2, Arterial: 64 mmHg — ABNORMAL LOW (ref 83.0–108.0)
pO2, Arterial: 318 mmHg — ABNORMAL HIGH (ref 83.0–108.0)
pO2, Arterial: 260 mmHg — ABNORMAL HIGH (ref 83.0–108.0)
pO2, Arterial: 73 mmHg — ABNORMAL LOW (ref 83.0–108.0)
pO2, Arterial: 62 mmHg — ABNORMAL LOW (ref 83.0–108.0)
pO2, Arterial: 64 mmHg — ABNORMAL LOW (ref 83.0–108.0)

## 2020-04-28 LAB — POCT I-STAT EG7
Acid-Base Excess: 0 mmol/L (ref 0.0–2.0)
Bicarbonate: 25 mmol/L (ref 20.0–28.0)
Calcium, Ion: 1.22 mmol/L (ref 1.15–1.40)
HCT: 49 % (ref 39.0–52.0)
Hemoglobin: 16.7 g/dL (ref 13.0–17.0)
O2 Saturation: 59 %
Potassium: 3.4 mmol/L — ABNORMAL LOW (ref 3.5–5.1)
Sodium: 137 mmol/L (ref 135–145)
TCO2: 26 mmol/L (ref 22–32)
pCO2, Ven: 40.5 mmHg — ABNORMAL LOW (ref 44.0–60.0)
pH, Ven: 7.398 (ref 7.250–7.430)
pO2, Ven: 31 mmHg — CL (ref 32.0–45.0)

## 2020-04-28 LAB — POCT I-STAT, CHEM 8
BUN: 15 mg/dL (ref 8–23)
BUN: 15 mg/dL (ref 8–23)
BUN: 15 mg/dL (ref 8–23)
BUN: 14 mg/dL (ref 8–23)
BUN: 15 mg/dL (ref 8–23)
Calcium, Ion: 1.16 mmol/L (ref 1.15–1.40)
Calcium, Ion: 1.18 mmol/L (ref 1.15–1.40)
Calcium, Ion: 0.99 mmol/L — ABNORMAL LOW (ref 1.15–1.40)
Calcium, Ion: 1.01 mmol/L — ABNORMAL LOW (ref 1.15–1.40)
Calcium, Ion: 1.2 mmol/L (ref 1.15–1.40)
Chloride: 100 mmol/L (ref 98–111)
Chloride: 100 mmol/L (ref 98–111)
Chloride: 101 mmol/L (ref 98–111)
Chloride: 100 mmol/L (ref 98–111)
Chloride: 103 mmol/L (ref 98–111)
Creatinine, Ser: 1.1 mg/dL (ref 0.61–1.24)
Creatinine, Ser: 1.2 mg/dL (ref 0.61–1.24)
Creatinine, Ser: 1.1 mg/dL (ref 0.61–1.24)
Creatinine, Ser: 1 mg/dL (ref 0.61–1.24)
Creatinine, Ser: 1.1 mg/dL (ref 0.61–1.24)
Glucose, Bld: 123 mg/dL — ABNORMAL HIGH (ref 70–99)
Glucose, Bld: 115 mg/dL — ABNORMAL HIGH (ref 70–99)
Glucose, Bld: 116 mg/dL — ABNORMAL HIGH (ref 70–99)
Glucose, Bld: 123 mg/dL — ABNORMAL HIGH (ref 70–99)
Glucose, Bld: 136 mg/dL — ABNORMAL HIGH (ref 70–99)
HCT: 42 % (ref 39.0–52.0)
HCT: 37 % — ABNORMAL LOW (ref 39.0–52.0)
HCT: 29 % — ABNORMAL LOW (ref 39.0–52.0)
HCT: 26 % — ABNORMAL LOW (ref 39.0–52.0)
HCT: 28 % — ABNORMAL LOW (ref 39.0–52.0)
Hemoglobin: 14.3 g/dL (ref 13.0–17.0)
Hemoglobin: 12.6 g/dL — ABNORMAL LOW (ref 13.0–17.0)
Hemoglobin: 9.9 g/dL — ABNORMAL LOW (ref 13.0–17.0)
Hemoglobin: 8.8 g/dL — ABNORMAL LOW (ref 13.0–17.0)
Hemoglobin: 9.5 g/dL — ABNORMAL LOW (ref 13.0–17.0)
Potassium: 3 mmol/L — ABNORMAL LOW (ref 3.5–5.1)
Potassium: 3.4 mmol/L — ABNORMAL LOW (ref 3.5–5.1)
Potassium: 3.9 mmol/L (ref 3.5–5.1)
Potassium: 3.4 mmol/L — ABNORMAL LOW (ref 3.5–5.1)
Potassium: 4.1 mmol/L (ref 3.5–5.1)
Sodium: 139 mmol/L (ref 135–145)
Sodium: 139 mmol/L (ref 135–145)
Sodium: 138 mmol/L (ref 135–145)
Sodium: 138 mmol/L (ref 135–145)
Sodium: 139 mmol/L (ref 135–145)
TCO2: 26 mmol/L (ref 22–32)
TCO2: 25 mmol/L (ref 22–32)
TCO2: 28 mmol/L (ref 22–32)
TCO2: 24 mmol/L (ref 22–32)
TCO2: 26 mmol/L (ref 22–32)

## 2020-04-28 LAB — GLUCOSE, CAPILLARY
Glucose-Capillary: 106 mg/dL — ABNORMAL HIGH (ref 70–99)
Glucose-Capillary: 166 mg/dL — ABNORMAL HIGH (ref 70–99)
Glucose-Capillary: 168 mg/dL — ABNORMAL HIGH (ref 70–99)
Glucose-Capillary: 132 mg/dL — ABNORMAL HIGH (ref 70–99)

## 2020-04-28 LAB — ECHO INTRAOPERATIVE TEE
Height: 70 in
Weight: 3305.14 oz

## 2020-04-28 LAB — CBC
HCT: 46.5 % (ref 39.0–52.0)
HCT: 29.1 % — ABNORMAL LOW (ref 39.0–52.0)
Hemoglobin: 15.8 g/dL (ref 13.0–17.0)
Hemoglobin: 9.8 g/dL — ABNORMAL LOW (ref 13.0–17.0)
MCH: 30.2 pg (ref 26.0–34.0)
MCH: 30.8 pg (ref 26.0–34.0)
MCHC: 34 g/dL (ref 30.0–36.0)
MCHC: 33.7 g/dL (ref 30.0–36.0)
MCV: 88.9 fL (ref 80.0–100.0)
MCV: 91.5 fL (ref 80.0–100.0)
Platelets: 331 10*3/uL (ref 150–400)
Platelets: 136 10*3/uL — ABNORMAL LOW (ref 150–400)
RBC: 5.23 MIL/uL (ref 4.22–5.81)
RBC: 3.18 MIL/uL — ABNORMAL LOW (ref 4.22–5.81)
RDW: 12.7 % (ref 11.5–15.5)
RDW: 12.5 % (ref 11.5–15.5)
WBC: 17.1 10*3/uL — ABNORMAL HIGH (ref 4.0–10.5)
WBC: 14 10*3/uL — ABNORMAL HIGH (ref 4.0–10.5)
nRBC: 0 % (ref 0.0–0.2)
nRBC: 0 % (ref 0.0–0.2)

## 2020-04-28 LAB — BASIC METABOLIC PANEL
Anion gap: 16 — ABNORMAL HIGH (ref 5–15)
BUN: 14 mg/dL (ref 8–23)
CO2: 23 mmol/L (ref 22–32)
Calcium: 9.1 mg/dL (ref 8.9–10.3)
Chloride: 99 mmol/L (ref 98–111)
Creatinine, Ser: 1.13 mg/dL (ref 0.61–1.24)
GFR calc Af Amer: >60 mL/min (ref >60)
GFR calc non Af Amer: >60 mL/min (ref >60)
Glucose, Bld: 139 mg/dL — ABNORMAL HIGH (ref 70–99)
Potassium: 2.7 mmol/L — CL (ref 3.5–5.1)
Sodium: 138 mmol/L (ref 135–145)

## 2020-04-28 LAB — HEMOGLOBIN AND HEMATOCRIT, BLOOD
HCT: 30.1 % — ABNORMAL LOW (ref 39.0–52.0)
Hemoglobin: 10.1 g/dL — ABNORMAL LOW (ref 13.0–17.0)

## 2020-04-28 LAB — PROTIME-INR
INR: 1.5 — ABNORMAL HIGH (ref 0.8–1.2)
Prothrombin Time: 17.2 seconds — ABNORMAL HIGH (ref 11.4–15.2)

## 2020-04-28 LAB — ECHOCARDIOGRAM COMPLETE
Height: 70 in
Weight: 3305.14 oz

## 2020-04-28 LAB — SURGICAL PCR SCREEN
MRSA, PCR: NEGATIVE
Staphylococcus aureus: NEGATIVE

## 2020-04-28 LAB — TROPONIN I (HIGH SENSITIVITY)
Troponin I (High Sensitivity): 22234 ng/L (ref <18)
Troponin I (High Sensitivity): >27000 ng/L (ref <18)

## 2020-04-28 LAB — PLATELET COUNT: Platelets: 179 10*3/uL (ref 150–400)

## 2020-04-28 LAB — MAGNESIUM: Magnesium: 1.8 mg/dL (ref 1.7–2.4)

## 2020-04-28 LAB — ABO/RH: ABO/RH(D): O POS

## 2020-04-28 LAB — PREPARE RBC (CROSSMATCH)

## 2020-04-28 LAB — APTT: aPTT: 35 seconds (ref 24–36)

## 2020-04-28 LAB — POCT ACTIVATED CLOTTING TIME: Activated Clotting Time: 169 seconds

## 2020-04-28 SURGERY — CORONARY ARTERY BYPASS GRAFTING (CABG)
Anesthesia: General | Site: Leg Upper

## 2020-04-28 MED ORDER — DOBUTAMINE IN D5W 4-5 MG/ML-% IV SOLN
2.5000 ug/kg/min | INTRAVENOUS | Status: DC
  Administered 2020-04-28: 2.5 ug/kg/min via INTRAVENOUS

## 2020-04-28 MED ORDER — INSULIN REGULAR(HUMAN) IN NACL 100-0.9 UT/100ML-% IV SOLN: INTRAVENOUS | Status: DC

## 2020-04-28 MED ORDER — CHLORHEXIDINE GLUCONATE CLOTH 2 % EX PADS: 6.0000 | MEDICATED_PAD | Freq: Once | CUTANEOUS | Status: DC

## 2020-04-28 MED ORDER — ATORVASTATIN CALCIUM 40 MG PO TABS
40.0000 mg | ORAL_TABLET | Freq: Every day | ORAL | Status: DC
  Administered 2020-04-30 – 2020-05-03 (×4): 40 mg via ORAL
  Filled 2020-04-28 (×4): qty 1

## 2020-04-28 MED ORDER — NOREPINEPHRINE 4 MG/250ML-% IV SOLN
0.0000 ug/min | INTRAVENOUS | Status: DC
  Filled 2020-04-28: qty 250

## 2020-04-28 MED ORDER — NITROGLYCERIN 0.4 MG SL SUBL: 0.4000 mg | SUBLINGUAL_TABLET | SUBLINGUAL | Status: DC | PRN

## 2020-04-28 MED ORDER — LEVOFLOXACIN IN D5W 500 MG/100ML IV SOLN
500.0000 mg | INTRAVENOUS | Status: AC
  Administered 2020-04-28: 500 mg via INTRAVENOUS
  Filled 2020-04-28: qty 100

## 2020-04-28 MED ORDER — ACETAMINOPHEN 500 MG PO TABS
1000.0000 mg | ORAL_TABLET | Freq: Four times a day (QID) | ORAL | Status: DC
  Administered 2020-04-29 – 2020-05-01 (×7): 1000 mg via ORAL
  Filled 2020-04-28 (×8): qty 2

## 2020-04-28 MED ORDER — POTASSIUM CHLORIDE 10 MEQ/50ML IV SOLN
10.0000 meq | INTRAVENOUS | Status: AC
  Administered 2020-04-28 (×3): 10 meq via INTRAVENOUS

## 2020-04-28 MED ORDER — VANCOMYCIN HCL 1500 MG/300ML IV SOLN
1500.0000 mg | INTRAVENOUS | Status: AC
  Administered 2020-04-28: 1500 mg via INTRAVENOUS
  Filled 2020-04-28: qty 300

## 2020-04-28 MED ORDER — MILRINONE LACTATE IN DEXTROSE 20-5 MG/100ML-% IV SOLN
0.3000 ug/kg/min | INTRAVENOUS | Status: DC
  Filled 2020-04-28: qty 100

## 2020-04-28 MED ORDER — LACTATED RINGERS IV SOLN: 500.0000 mL | Freq: Once | INTRAVENOUS | Status: DC | PRN

## 2020-04-28 MED ORDER — SODIUM CHLORIDE 0.9 % IV SOLN: INTRAVENOUS | Status: DC

## 2020-04-28 MED ORDER — MAGNESIUM SULFATE 2 GM/50ML IV SOLN
2.0000 g | Freq: Once | INTRAVENOUS | Status: AC
  Administered 2020-04-28: 2 g via INTRAVENOUS
  Filled 2020-04-28: qty 50

## 2020-04-28 MED ORDER — ACETAMINOPHEN 650 MG RE SUPP: 650.0000 mg | Freq: Once | RECTAL | Status: DC

## 2020-04-28 MED ORDER — ALBUMIN HUMAN 5 % IV SOLN
250.0000 mL | INTRAVENOUS | Status: AC | PRN
  Administered 2020-04-28 (×3): 12.5 g via INTRAVENOUS
  Filled 2020-04-28: qty 250

## 2020-04-28 MED ORDER — PHENYLEPHRINE HCL-NACL 20-0.9 MG/250ML-% IV SOLN
30.0000 ug/min | INTRAVENOUS | Status: AC
  Administered 2020-04-28: 75 ug/min via INTRAVENOUS
  Filled 2020-04-28: qty 250

## 2020-04-28 MED ORDER — DEXTROSE 50 % IV SOLN: 0.0000 mL | INTRAVENOUS | Status: DC | PRN

## 2020-04-28 MED ORDER — METOPROLOL TARTRATE 12.5 MG HALF TABLET
12.5000 mg | ORAL_TABLET | Freq: Two times a day (BID) | ORAL | Status: DC
  Administered 2020-04-29 – 2020-05-01 (×5): 12.5 mg via ORAL
  Filled 2020-04-28 (×5): qty 1

## 2020-04-28 MED ORDER — DOCUSATE SODIUM 100 MG PO CAPS
200.0000 mg | ORAL_CAPSULE | Freq: Every day | ORAL | Status: DC
  Administered 2020-04-29 – 2020-05-01 (×3): 200 mg via ORAL
  Filled 2020-04-28 (×3): qty 2

## 2020-04-28 MED ORDER — LIDOCAINE HCL (CARDIAC) PF 100 MG/5ML IV SOSY
PREFILLED_SYRINGE | INTRAVENOUS | Status: DC | PRN
  Administered 2020-04-28: 100 mg via INTRAVENOUS

## 2020-04-28 MED ORDER — PANTOPRAZOLE SODIUM 40 MG PO TBEC
40.0000 mg | DELAYED_RELEASE_TABLET | Freq: Every day | ORAL | Status: DC
  Administered 2020-04-29 – 2020-05-01 (×3): 40 mg via ORAL
  Filled 2020-04-28 (×3): qty 1

## 2020-04-28 MED ORDER — DOCUSATE SODIUM 100 MG PO CAPS: 100.0000 mg | ORAL_CAPSULE | Freq: Two times a day (BID) | ORAL | Status: DC | PRN

## 2020-04-28 MED ORDER — SODIUM CHLORIDE 0.9 % IV SOLN: 250.0000 mL | INTRAVENOUS | Status: DC | PRN

## 2020-04-28 MED ORDER — HEMOSTATIC AGENTS (NO CHARGE) OPTIME
TOPICAL | Status: DC | PRN
  Administered 2020-04-28 (×2): 1 via TOPICAL

## 2020-04-28 MED ORDER — METOPROLOL TARTRATE 5 MG/5ML IV SOLN: 2.5000 mg | INTRAVENOUS | Status: DC | PRN

## 2020-04-28 MED ORDER — SODIUM CHLORIDE 0.9% FLUSH: 10.0000 mL | INTRAVENOUS | Status: DC | PRN

## 2020-04-28 MED ORDER — SODIUM CHLORIDE 0.9% FLUSH: 3.0000 mL | Freq: Two times a day (BID) | INTRAVENOUS | Status: DC

## 2020-04-28 MED ORDER — POTASSIUM CHLORIDE 2 MEQ/ML IV SOLN
80.0000 meq | INTRAVENOUS | Status: DC
  Filled 2020-04-28: qty 40

## 2020-04-28 MED ORDER — SODIUM CHLORIDE 0.9% FLUSH: 3.0000 mL | INTRAVENOUS | Status: DC | PRN

## 2020-04-28 MED ORDER — LABETALOL HCL 5 MG/ML IV SOLN: 10.0000 mg | INTRAVENOUS | Status: DC | PRN

## 2020-04-28 MED ORDER — SODIUM CHLORIDE (PF) 0.9 % IJ SOLN
OROMUCOSAL | Status: DC | PRN
  Administered 2020-04-28 (×3): 4 mL via TOPICAL

## 2020-04-28 MED ORDER — TEMAZEPAM 15 MG PO CAPS: 15.0000 mg | ORAL_CAPSULE | Freq: Once | ORAL | Status: DC | PRN

## 2020-04-28 MED ORDER — ACETAMINOPHEN 160 MG/5ML PO SOLN: 650.0000 mg | Freq: Once | ORAL | Status: DC

## 2020-04-28 MED ORDER — ASPIRIN EC 81 MG PO TBEC: 81.0000 mg | DELAYED_RELEASE_TABLET | Freq: Every day | ORAL | Status: DC

## 2020-04-28 MED ORDER — ONDANSETRON HCL 4 MG/2ML IJ SOLN: 4.0000 mg | Freq: Four times a day (QID) | INTRAMUSCULAR | Status: DC | PRN

## 2020-04-28 MED ORDER — LEVOFLOXACIN IN D5W 750 MG/150ML IV SOLN
750.0000 mg | INTRAVENOUS | Status: AC
  Administered 2020-04-29: 750 mg via INTRAVENOUS
  Filled 2020-04-28: qty 150

## 2020-04-28 MED ORDER — BISACODYL 10 MG RE SUPP: 10.0000 mg | Freq: Every day | RECTAL | Status: DC

## 2020-04-28 MED ORDER — NOREPINEPHRINE 4 MG/250ML-% IV SOLN
0.0000 ug/min | INTRAVENOUS | Status: DC
  Administered 2020-04-29: 1 ug/min via INTRAVENOUS
  Filled 2020-04-28: qty 250

## 2020-04-28 MED ORDER — MANNITOL 20 % IV SOLN
INTRAVENOUS | Status: DC
  Filled 2020-04-28: qty 13

## 2020-04-28 MED ORDER — CHLORHEXIDINE GLUCONATE 0.12 % MT SOLN: 15.0000 mL | OROMUCOSAL | Status: AC

## 2020-04-28 MED ORDER — SODIUM CHLORIDE 0.9 % IV SOLN: 250.0000 mL | INTRAVENOUS | Status: DC

## 2020-04-28 MED ORDER — PHENYLEPHRINE HCL (PRESSORS) 10 MG/ML IV SOLN
INTRAVENOUS | Status: DC | PRN
Start: 2020-04-28 — End: 2020-04-28
  Administered 2020-04-28 (×2): 80 ug via INTRAVENOUS
  Administered 2020-04-28: 40 ug via INTRAVENOUS
  Administered 2020-04-28 (×3): 80 ug via INTRAVENOUS

## 2020-04-28 MED ORDER — CHLORHEXIDINE GLUCONATE 0.12 % MT SOLN: 15.0000 mL | Freq: Once | OROMUCOSAL | Status: DC

## 2020-04-28 MED ORDER — METOPROLOL TARTRATE 25 MG/10 ML ORAL SUSPENSION
12.5000 mg | Freq: Two times a day (BID) | ORAL | Status: DC
  Administered 2020-04-29: 12.5 mg
  Filled 2020-04-28: qty 5

## 2020-04-28 MED ORDER — TRANEXAMIC ACID 1000 MG/10ML IV SOLN
1.5000 mg/kg/h | INTRAVENOUS | Status: AC
  Administered 2020-04-28: 15 mg/kg/h via INTRAVENOUS
  Filled 2020-04-28: qty 25

## 2020-04-28 MED ORDER — ASPIRIN 81 MG PO CHEW: 324.0000 mg | CHEWABLE_TABLET | Freq: Every day | ORAL | Status: DC

## 2020-04-28 MED ORDER — BISACODYL 5 MG PO TBEC
10.0000 mg | DELAYED_RELEASE_TABLET | Freq: Every day | ORAL | Status: DC
  Administered 2020-04-29 – 2020-05-01 (×3): 10 mg via ORAL
  Filled 2020-04-28 (×3): qty 2

## 2020-04-28 MED ORDER — SUFENTANIL CITRATE 50 MCG/ML IV SOLN
INTRAVENOUS | Status: DC | PRN
  Administered 2020-04-28: 20 ug via INTRAVENOUS
  Administered 2020-04-28: 5 ug via INTRAVENOUS
  Administered 2020-04-28: 10 ug via INTRAVENOUS
  Administered 2020-04-28: 25 ug via INTRAVENOUS
  Administered 2020-04-28: 10 ug via INTRAVENOUS
  Administered 2020-04-28: 5 ug via INTRAVENOUS
  Administered 2020-04-28: 15 ug via INTRAVENOUS
  Administered 2020-04-28: 10 ug via INTRAVENOUS
  Administered 2020-04-28: 15 ug via INTRAVENOUS

## 2020-04-28 MED ORDER — INSULIN REGULAR(HUMAN) IN NACL 100-0.9 UT/100ML-% IV SOLN
INTRAVENOUS | Status: AC
  Administered 2020-04-28: 1.1 [IU]/h via INTRAVENOUS
  Filled 2020-04-28: qty 100

## 2020-04-28 MED ORDER — PLASMA-LYTE 148 IV SOLN
INTRAVENOUS | Status: DC
  Filled 2020-04-28: qty 2.5

## 2020-04-28 MED ORDER — 0.9 % SODIUM CHLORIDE (POUR BTL) OPTIME
TOPICAL | Status: DC | PRN
  Administered 2020-04-28: 5000 mL

## 2020-04-28 MED ORDER — OXYCODONE HCL 5 MG PO TABS: 5.0000 mg | ORAL_TABLET | ORAL | Status: DC | PRN

## 2020-04-28 MED ORDER — LACTATED RINGERS IV SOLN: INTRAVENOUS | Status: DC | PRN

## 2020-04-28 MED ORDER — NICARDIPINE HCL IN NACL 20-0.86 MG/200ML-% IV SOLN
3.0000 mg/h | INTRAVENOUS | Status: DC
  Filled 2020-04-28: qty 200

## 2020-04-28 MED ORDER — PROPOFOL 10 MG/ML IV BOLUS
INTRAVENOUS | Status: DC | PRN
  Administered 2020-04-28 (×2): 50 mg via INTRAVENOUS

## 2020-04-28 MED ORDER — ASPIRIN EC 325 MG PO TBEC
325.0000 mg | DELAYED_RELEASE_TABLET | Freq: Every day | ORAL | Status: DC
  Administered 2020-04-29 – 2020-05-01 (×3): 325 mg via ORAL
  Filled 2020-04-28 (×3): qty 1

## 2020-04-28 MED ORDER — LACTATED RINGERS IV SOLN: INTRAVENOUS | Status: DC

## 2020-04-28 MED ORDER — LIDOCAINE 2% (20 MG/ML) 5 ML SYRINGE
INTRAMUSCULAR | Status: DC | PRN
  Administered 2020-04-28: 100 mg via INTRAVENOUS

## 2020-04-28 MED ORDER — POLYETHYLENE GLYCOL 3350 17 G PO PACK: 17.0000 g | PACK | Freq: Every day | ORAL | Status: DC | PRN

## 2020-04-28 MED ORDER — SODIUM CHLORIDE 0.9% FLUSH: 10.0000 mL | Freq: Two times a day (BID) | INTRAVENOUS | Status: DC

## 2020-04-28 MED ORDER — ACETAMINOPHEN 325 MG PO TABS: 650.0000 mg | ORAL_TABLET | Freq: Three times a day (TID) | ORAL | Status: DC | PRN

## 2020-04-28 MED ORDER — CHLORHEXIDINE GLUCONATE CLOTH 2 % EX PADS
6.0000 | MEDICATED_PAD | Freq: Every day | CUTANEOUS | Status: DC
  Administered 2020-04-29 – 2020-05-01 (×4): 6 via TOPICAL

## 2020-04-28 MED ORDER — FAMOTIDINE IN NACL 20-0.9 MG/50ML-% IV SOLN
20.0000 mg | Freq: Two times a day (BID) | INTRAVENOUS | Status: AC
  Administered 2020-04-28 – 2020-04-29 (×2): 20 mg via INTRAVENOUS
  Filled 2020-04-28 (×2): qty 50

## 2020-04-28 MED ORDER — BISACODYL 5 MG PO TBEC: 5.0000 mg | DELAYED_RELEASE_TABLET | Freq: Once | ORAL | Status: DC

## 2020-04-28 MED ORDER — OXYCODONE HCL 5 MG PO TABS
5.0000 mg | ORAL_TABLET | ORAL | Status: DC | PRN
  Administered 2020-04-29: 10 mg via ORAL
  Filled 2020-04-28: qty 2

## 2020-04-28 MED ORDER — ORAL CARE MOUTH RINSE: 15.0000 mL | Freq: Two times a day (BID) | OROMUCOSAL | Status: DC

## 2020-04-28 MED ORDER — ROCURONIUM BROMIDE 10 MG/ML (PF) SYRINGE
PREFILLED_SYRINGE | INTRAVENOUS | Status: DC | PRN
  Administered 2020-04-28 (×2): 50 mg via INTRAVENOUS
  Administered 2020-04-28: 100 mg via INTRAVENOUS

## 2020-04-28 MED ORDER — FUROSEMIDE 10 MG/ML IJ SOLN
40.0000 mg | Freq: Once | INTRAMUSCULAR | Status: AC
  Administered 2020-04-28: 40 mg via INTRAVENOUS
  Filled 2020-04-28: qty 4

## 2020-04-28 MED ORDER — MORPHINE SULFATE (PF) 2 MG/ML IV SOLN
1.0000 mg | INTRAVENOUS | Status: DC | PRN
  Administered 2020-04-28 (×3): 4 mg via INTRAVENOUS
  Administered 2020-04-29: 2 mg via INTRAVENOUS
  Filled 2020-04-28 (×4): qty 2

## 2020-04-28 MED ORDER — POTASSIUM CHLORIDE CRYS ER 20 MEQ PO TBCR
40.0000 meq | EXTENDED_RELEASE_TABLET | ORAL | Status: DC
  Administered 2020-04-28: 40 meq via ORAL
  Filled 2020-04-28: qty 2

## 2020-04-28 MED ORDER — DEXMEDETOMIDINE HCL IN NACL 400 MCG/100ML IV SOLN
0.1000 ug/kg/h | INTRAVENOUS | Status: AC
  Administered 2020-04-28: .4 ug/kg/h via INTRAVENOUS
  Filled 2020-04-28: qty 100

## 2020-04-28 MED ORDER — EPINEPHRINE HCL 5 MG/250ML IV SOLN IN NS
0.0000 ug/min | INTRAVENOUS | Status: DC
  Filled 2020-04-28: qty 250

## 2020-04-28 MED ORDER — PLASMA-LYTE 148 IV SOLN
INTRAVENOUS | Status: DC | PRN
  Administered 2020-04-28: 500 mL

## 2020-04-28 MED ORDER — TRANEXAMIC ACID (OHS) BOLUS VIA INFUSION
15.0000 mg/kg | INTRAVENOUS | Status: AC
  Administered 2020-04-28: 1405.5 mg via INTRAVENOUS
  Filled 2020-04-28: qty 1406

## 2020-04-28 MED ORDER — HEPARIN SODIUM (PORCINE) 1000 UNIT/ML IJ SOLN
INTRAMUSCULAR | Status: DC | PRN
  Administered 2020-04-28: 35000 [IU] via INTRAVENOUS

## 2020-04-28 MED ORDER — SODIUM CHLORIDE 0.9 % IV SOLN
INTRAVENOUS | Status: DC
  Filled 2020-04-28: qty 30

## 2020-04-28 MED ORDER — DEXMEDETOMIDINE HCL IN NACL 400 MCG/100ML IV SOLN
0.0000 ug/kg/h | INTRAVENOUS | Status: DC
  Filled 2020-04-28: qty 100

## 2020-04-28 MED ORDER — NITROGLYCERIN IN D5W 200-5 MCG/ML-% IV SOLN
2.0000 ug/min | INTRAVENOUS | Status: AC
  Administered 2020-04-28: 15 ug/min via INTRAVENOUS
  Filled 2020-04-28: qty 250

## 2020-04-28 MED ORDER — DOBUTAMINE IN D5W 4-5 MG/ML-% IV SOLN
INTRAVENOUS | Status: DC | PRN
  Administered 2020-04-28: 2 ug/kg/min via INTRAVENOUS

## 2020-04-28 MED ORDER — TRAMADOL HCL 50 MG PO TABS: 50.0000 mg | ORAL_TABLET | ORAL | Status: DC | PRN

## 2020-04-28 MED ORDER — ALBUMIN HUMAN 5 % IV SOLN
INTRAVENOUS | Status: DC | PRN
Start: 2020-04-28 — End: 2020-04-28

## 2020-04-28 MED ORDER — HYDRALAZINE HCL 20 MG/ML IJ SOLN: 10.0000 mg | INTRAMUSCULAR | Status: DC | PRN

## 2020-04-28 MED ORDER — HEPARIN (PORCINE) 25000 UT/250ML-% IV SOLN
1300.0000 [IU]/h | INTRAVENOUS | Status: DC
  Administered 2020-04-28: 1300 [IU]/h via INTRAVENOUS
  Filled 2020-04-28: qty 250

## 2020-04-28 MED ORDER — ATORVASTATIN CALCIUM 40 MG PO TABS: 40.0000 mg | ORAL_TABLET | Freq: Every day | ORAL | Status: DC

## 2020-04-28 MED ORDER — VANCOMYCIN HCL IN DEXTROSE 1-5 GM/200ML-% IV SOLN
1000.0000 mg | Freq: Once | INTRAVENOUS | Status: AC
  Administered 2020-04-29: 1000 mg via INTRAVENOUS
  Filled 2020-04-28: qty 200

## 2020-04-28 MED ORDER — METOPROLOL TARTRATE 12.5 MG HALF TABLET: 12.5000 mg | ORAL_TABLET | Freq: Once | ORAL | Status: DC

## 2020-04-28 MED ORDER — MAGNESIUM SULFATE 4 GM/100ML IV SOLN
4.0000 g | Freq: Once | INTRAVENOUS | Status: AC
  Administered 2020-04-28: 4 g via INTRAVENOUS
  Filled 2020-04-28: qty 100

## 2020-04-28 MED ORDER — SODIUM CHLORIDE 0.45 % IV SOLN: INTRAVENOUS | Status: DC | PRN

## 2020-04-28 MED ORDER — TRANEXAMIC ACID (OHS) PUMP PRIME SOLUTION
2.0000 mg/kg | INTRAVENOUS | Status: DC
  Filled 2020-04-28: qty 1.87

## 2020-04-28 MED ORDER — ARTIFICIAL TEARS OPHTHALMIC OINT
TOPICAL_OINTMENT | OPHTHALMIC | Status: DC | PRN
  Administered 2020-04-28: 1 via OPHTHALMIC

## 2020-04-28 MED ORDER — CHLORHEXIDINE GLUCONATE CLOTH 2 % EX PADS: 6.0000 | MEDICATED_PAD | Freq: Every day | CUTANEOUS | Status: DC

## 2020-04-28 MED ORDER — ACETAMINOPHEN 160 MG/5ML PO SOLN
1000.0000 mg | Freq: Four times a day (QID) | ORAL | Status: DC
  Administered 2020-04-29 (×2): 1000 mg
  Filled 2020-04-28 (×2): qty 40.6

## 2020-04-28 MED ORDER — PROTAMINE SULFATE 10 MG/ML IV SOLN
INTRAVENOUS | Status: DC | PRN
  Administered 2020-04-28: 310 mg via INTRAVENOUS
  Administered 2020-04-28: 10 mg via INTRAVENOUS

## 2020-04-28 MED FILL — Heparin Sodium (Porcine) Inj 1000 Unit/ML: INTRAMUSCULAR | Qty: 10 | Status: AC

## 2020-04-28 SURGICAL SUPPLY — 90 items
ADAPTER MULTI PERFUSION 15 (ADAPTER) ×3 IMPLANT
BAG DECANTER FOR FLEXI CONT (MISCELLANEOUS) ×3 IMPLANT
BLADE CLIPPER SURG (BLADE) ×3 IMPLANT
BLADE STERNUM SYSTEM 6 (BLADE) ×3 IMPLANT
BNDG ELASTIC 4X5.8 VLCR STR LF (GAUZE/BANDAGES/DRESSINGS) ×6 IMPLANT
BNDG ELASTIC 6X5.8 VLCR STR LF (GAUZE/BANDAGES/DRESSINGS) ×6 IMPLANT
BNDG GAUZE ELAST 4 BULKY (GAUZE/BANDAGES/DRESSINGS) ×6 IMPLANT
CABLE SURGICAL S-101-97-12 (CABLE) ×3 IMPLANT
CANISTER SUCT 3000ML PPV (MISCELLANEOUS) ×3 IMPLANT
CANNULA AORTIC ROOT 9FR (CANNULA) ×3 IMPLANT
CANNULA EZ GLIDE 8.0 24FR (CANNULA) ×3 IMPLANT
CANNULA MC2 2 STG 29/37 NON-V (CANNULA) ×2 IMPLANT
CANNULA MC2 TWO STAGE (CANNULA) ×3
CANNULA VESSEL 3MM BLUNT TIP (CANNULA) ×3 IMPLANT
CATH ROBINSON RED A/P 18FR (CATHETERS) ×6 IMPLANT
CLIP RETRACTION 3.0MM CORONARY (MISCELLANEOUS) ×3 IMPLANT
CLIP VESOCCLUDE MED 24/CT (CLIP) ×3 IMPLANT
CLIP VESOCCLUDE SM WIDE 24/CT (CLIP) IMPLANT
CONN ST 1/2X1/2  BEN (MISCELLANEOUS) ×3
CONN ST 1/2X1/2 BEN (MISCELLANEOUS) ×2 IMPLANT
CONNECTOR BLAKE 2:1 CARIO BLK (MISCELLANEOUS) ×3 IMPLANT
DEVICE COMPRESS FEMOSTOP GOLD ×3 IMPLANT
DRAIN CHANNEL 19F RND (DRAIN) ×9 IMPLANT
DRAIN CONNECTOR BLAKE 1:1 (MISCELLANEOUS) ×6 IMPLANT
DRAPE CARDIOVASCULAR INCISE (DRAPES) ×3
DRAPE SLUSH/WARMER DISC (DRAPES) ×3 IMPLANT
DRAPE SRG 135X102X78XABS (DRAPES) ×2 IMPLANT
DRSG AQUACEL AG ADV 3.5X10 (GAUZE/BANDAGES/DRESSINGS) ×3 IMPLANT
DRSG COVADERM 4X14 (GAUZE/BANDAGES/DRESSINGS) ×3 IMPLANT
ELECT REM PT RETURN 9FT ADLT (ELECTROSURGICAL) ×6
ELECTRODE REM PT RTRN 9FT ADLT (ELECTROSURGICAL) ×4 IMPLANT
FELT TEFLON 1X6 (MISCELLANEOUS) ×3 IMPLANT
GAUZE SPONGE 4X4 12PLY STRL (GAUZE/BANDAGES/DRESSINGS) ×6 IMPLANT
GAUZE SPONGE 4X4 12PLY STRL LF (GAUZE/BANDAGES/DRESSINGS) ×9 IMPLANT
GLOVE BIO SURGEON STRL SZ 6.5 (GLOVE) ×6 IMPLANT
GLOVE BIO SURGEON STRL SZ7 (GLOVE) ×6 IMPLANT
GLOVE BIOGEL M STRL SZ7.5 (GLOVE) ×6 IMPLANT
GLOVE BIOGEL PI IND STRL 6 (GLOVE) ×6 IMPLANT
GLOVE BIOGEL PI INDICATOR 6 (GLOVE) ×3
GLOVE SURG SS PI 6.0 STRL IVOR (GLOVE) ×12 IMPLANT
GOWN STRL REUS W/ TWL LRG LVL3 (GOWN DISPOSABLE) ×16 IMPLANT
GOWN STRL REUS W/ TWL XL LVL3 (GOWN DISPOSABLE) ×4 IMPLANT
GOWN STRL REUS W/TWL LRG LVL3 (GOWN DISPOSABLE) ×24
GOWN STRL REUS W/TWL XL LVL3 (GOWN DISPOSABLE) ×6
HEMOSTAT HEMOBLAST BELLOWS (HEMOSTASIS) ×6 IMPLANT
HEMOSTAT POWDER SURGIFOAM 1G (HEMOSTASIS) ×9 IMPLANT
KIT BASIN OR (CUSTOM PROCEDURE TRAY) ×3 IMPLANT
KIT SUCTION CATH 14FR (SUCTIONS) ×3 IMPLANT
KIT TURNOVER KIT B (KITS) ×3 IMPLANT
KIT VASOVIEW HEMOPRO 2 VH 4000 (KITS) ×3 IMPLANT
LEAD PACING MYOCARDI (MISCELLANEOUS) ×3 IMPLANT
MARKER GRAFT CORONARY BYPASS (MISCELLANEOUS) ×9 IMPLANT
NS IRRIG 1000ML POUR BTL (IV SOLUTION) ×15 IMPLANT
PACK ACCESSORY CANNULA KIT (KITS) ×3 IMPLANT
PACK E OPEN HEART (SUTURE) ×3 IMPLANT
PACK OPEN HEART (CUSTOM PROCEDURE TRAY) ×3 IMPLANT
PAD ARMBOARD 7.5X6 YLW CONV (MISCELLANEOUS) ×6 IMPLANT
PAD ELECT DEFIB RADIOL ZOLL (MISCELLANEOUS) ×3 IMPLANT
PENCIL BUTTON HOLSTER BLD 10FT (ELECTRODE) ×3 IMPLANT
POSITIONER HEAD DONUT 9IN (MISCELLANEOUS) ×3 IMPLANT
PUNCH AORTIC ROTATE 4.0MM (MISCELLANEOUS) ×3 IMPLANT
SET CARDIOPLEGIA MPS 5001102 (MISCELLANEOUS) ×3 IMPLANT
SPONGE LAP 18X18 RF (DISPOSABLE) ×9 IMPLANT
SUPPORT HEART JANKE-BARRON (MISCELLANEOUS) ×3 IMPLANT
SUT BONE WAX W31G (SUTURE) ×3 IMPLANT
SUT ETHIBOND X763 2 0 SH 1 (SUTURE) ×6 IMPLANT
SUT MNCRL AB 3-0 PS2 18 (SUTURE) ×6 IMPLANT
SUT MNCRL AB 4-0 PS2 18 (SUTURE) ×6 IMPLANT
SUT PDS AB 1 CTX 36 (SUTURE) ×6 IMPLANT
SUT PROLENE 4 0 SH DA (SUTURE) ×3 IMPLANT
SUT PROLENE 5 0 C 1 36 (SUTURE) ×15 IMPLANT
SUT PROLENE 7 0 BV 1 (SUTURE) ×12 IMPLANT
SUT PROLENE 7 0 BV1 MDA (SUTURE) ×3 IMPLANT
SUT STEEL 6MS V (SUTURE) IMPLANT
SUT STEEL STERNAL CCS#1 18IN (SUTURE) ×6 IMPLANT
SUT VIC AB 1 CTX 36 (SUTURE) ×3
SUT VIC AB 1 CTX36XBRD ANBCTR (SUTURE) ×2 IMPLANT
SUT VIC AB 2-0 CT1 27 (SUTURE) ×6
SUT VIC AB 2-0 CT1 TAPERPNT 27 (SUTURE) ×4 IMPLANT
SYSTEM SAHARA CHEST DRAIN ATS (WOUND CARE) ×3 IMPLANT
TAPE CLOTH SURG 4X10 WHT LF (GAUZE/BANDAGES/DRESSINGS) ×3 IMPLANT
TAPE PAPER 2X10 WHT MICROPORE (GAUZE/BANDAGES/DRESSINGS) ×3 IMPLANT
TOWEL GREEN STERILE (TOWEL DISPOSABLE) ×3 IMPLANT
TOWEL GREEN STERILE FF (TOWEL DISPOSABLE) ×3 IMPLANT
TRAY FOLEY SLVR 16FR TEMP STAT (SET/KITS/TRAYS/PACK) ×3 IMPLANT
TUBE SUCT INTRACARD DLP 20F (MISCELLANEOUS) ×3 IMPLANT
TUBE SUCTION CARDIAC 10FR (CANNULA) ×6 IMPLANT
TUBING LAP HI FLOW INSUFFLATIO (TUBING) ×3 IMPLANT
UNDERPAD 30X36 HEAVY ABSORB (UNDERPADS AND DIAPERS) ×3 IMPLANT
WATER STERILE IRR 1000ML POUR (IV SOLUTION) ×6 IMPLANT

## 2020-04-28 NOTE — Progress Notes (Signed)
[facility] BRIEF SICU PROGRESS NOTE  Day of Surgery  S/P Procedure(s) (LRB): CORONARY ARTERY BYPASS GRAFTING (CABG) times four using LIMA to LAD; bilateral endoscopic greater saphenous vein harvest: SVG to Circ; SVG to RAMUS; and SVG to PDA. (N/A) Endovein Harvest Of Greater Saphenous Vein (Bilateral)   Starting to wake on vent NSR w/ BP somewhat labile as patient waking up O2 sats 96% Chest tube output low UOP adequate  Plan: Continue routine early postop - may need more volume given  Purcell Nails, MD 04/28/2020 7:14 PM

## 2020-04-28 NOTE — Anesthesia Procedure Notes (Signed)
Central Venous Catheter Insertion Performed by: Nolon Nations, MD, anesthesiologist Start/End5/10/2020 11:20 AM, 04/28/2020 11:35 AM Patient location: Pre-op. Preanesthetic checklist: patient identified, IV checked, site marked, risks and benefits discussed, surgical consent, monitors and equipment checked, pre-op evaluation, timeout performed and anesthesia consent Position: Trendelenburg Lidocaine 1% used for infiltration and patient sedated Hand hygiene performed  and maximum sterile barriers used  Catheter size: 9 Fr MAC introducer Swan type:thermodilution PA Cath depth:50 Procedure performed using ultrasound guided technique. Ultrasound Notes:anatomy identified, needle tip was noted to be adjacent to the nerve/plexus identified, no ultrasound evidence of intravascular and/or intraneural injection and image(s) printed for medical record Attempts: 1 Following insertion, line sutured, dressing applied and Biopatch. Post procedure assessment: blood return through all ports, free fluid flow and no air  Patient tolerated the procedure well with no immediate complications.

## 2020-04-28 NOTE — Anesthesia Procedure Notes (Signed)
Procedure Name: Intubation Date/Time: 04/28/2020 11:17 AM Performed by: Carmela Rima, CRNA Pre-anesthesia Checklist: Timeout performed, Patient being monitored, Suction available, Emergency Drugs available and Patient identified Patient Re-evaluated:Patient Re-evaluated prior to induction Oxygen Delivery Method: Circle system utilized Preoxygenation: Pre-oxygenation with 100% oxygen Induction Type: IV induction Ventilation: Mask ventilation without difficulty and Oral airway inserted - appropriate to patient size Laryngoscope Size: Glidescope and 3 Grade View: Grade II Tube type: Oral Tube size: 8.0 mm Number of attempts: 1 Placement Confirmation: breath sounds checked- equal and bilateral and positive ETCO2 Secured at: 23 cm Tube secured with: Tape Dental Injury: Teeth and Oropharynx as per pre-operative assessment  Difficulty Due To: Difficulty was anticipated, Difficult Airway- due to anterior larynx and Difficult Airway- due to limited oral opening

## 2020-04-28 NOTE — Transfer of Care (Signed)
Immediate Anesthesia Transfer of Care Note  Patient: Joshua Mack  Procedure(s) Performed: CORONARY ARTERY BYPASS GRAFTING (CABG) times four using LIMA to LAD; bilateral endoscopic greater saphenous vein harvest: SVG to Circ; SVG to RAMUS; and SVG to PDA. (N/A Chest) Endovein Harvest Of Greater Saphenous Vein (Bilateral Leg Upper)  Patient Location: ICU  Anesthesia Type:General  Level of Consciousness: sedated  Airway & Oxygen Therapy: Patient remains intubated per anesthesia plan and Patient placed on Ventilator (see vital sign flow sheet for setting)  Post-op Assessment: Report given to RN and Post -op Vital signs reviewed and stable  Post vital signs: Reviewed Stable  Last Vitals:  Vitals Value Taken Time  BP    Temp    Pulse    Resp    SpO2      Last Pain:  Vitals:   04/28/20 0400  TempSrc:   PainSc: 0-No pain         Complications: No apparent anesthesia complications   Surgeon and MDA at bedside for report/handoff.  Tolerated transport well.

## 2020-04-28 NOTE — Progress Notes (Signed)
  Echocardiogram 2D Echocardiogram has been performed.  Celene Skeen 04/28/2020, 8:51 AM

## 2020-04-28 NOTE — Op Note (Signed)
[address]       [place] [address]             [phone]                                          04/28/2020 Patient:  Joshua Mack Pre-Op Dx: STEMI   3V CAD   Post-op Dx:  same Procedure: CABG X 4.  LIMA-LAD, RSVG PDA, D1 and OM3    Endoscopic greater saphenous vein harvest on the right and left thighs Intra-operative Transesophageal Echocardiogram Removal of Intra-aortic balloon pump   Surgeon and Role:      * Montanna Mcbain, Eliezer Lofts, MD - Primary    * T. Asa Lente, PA-C - assisting   Anesthesia  general EBL:  Blood Administration: none Xclamp Time:  72 min Pump Time:   Drains: 19 F blake drain:  R, L, mediastinal  Wires: none Counts: correct   Indications: 79 yo male admitted transferred from [facility] with an STEMI.  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  A balloon pump was placed, and he was then transferred to the ICU.    Findings: Small vein.  One long segment was unusable.  Good LIMA.  Calcified LAD.  Good PDA, small OM.  We did not have the vein conduit and since the first diagonal branch was a much better target elected to bypass this past.  The ramus branch was intramyocardial and smaller.  Operative Technique: All invasive lines were placed in pre-op holding.  After the risks, benefits and alternatives were thoroughly discussed, the patient was brought to the operative theatre.  Anesthesia was induced, and the patient was prepped and draped in normal sterile fashion.  An appropriate surgical pause was performed, and pre-operative antibiotics were dosed accordingly.  We began with simultaneous incisions were made along the right leg for harvesting of the greater saphenous vein and the chest for the sternotomy.  In regards to the sternotomy, this was carried down with bovie cautery, and the sternum was divided with a reciprocating saw.  Meticulous hemostasis was obtained.  The left  internal thoracic artery was exposed and harvested in in pedicled fashion.  The patient was systemically heparinized, and the artery was divided distally, and placed in a papaverine sponge.    The sternal elevator was removed, and a retractor was placed.  The pericardium was divided in the midline and fashioned into a cradle with pericardial stitches.   After we confirmed an appropriate ACT, the ascending aorta was cannulated in standard fashion.  The right atrial appendage was used for venous cannulation site.  Cardiopulmonary bypass was initiated, and the heart retractor was placed. The cross clamp was applied, and a dose of anterograde cardioplegia was given with good arrest of the heart.  We moved to the posterior wall of the heart, and found a good target on the PDA.  An arteriotomy was made, and the vein graft was anastomosed to it in an end to side fashion.  Next we exposed the lateral wall, and found a good target on the OM 3.  An end to side anastomosis with the vein graft was then created.  Next, we exposed the anterior wall of the heart and identified a good target on first diagonal.   An  arteriotomy was created.  The vein was anastomosed in an end to side fashion.  Finally, we exposed a good target on the LAD, and fashioned an end to side anastomosis between it and the LITA.  We began to re-warm, and a re-animation dose of cardioplegia was given.  The heart was de-aired, and the cross clamp was removed.  Meticulous hemostasis was obtained.    A partial occludding clamp was then placed on the ascending aorta, and we created an end to side anastomosis between it and the proximal vein grafts.  The proximal sites were marked with rings.  Hemostasis was obtained, and we separated from cardiopulmonary bypass without event.the heparin was reversed with protamine.  Chest tubes and wires were placed, and the sternum was re-approximated with with sternal wires.  The soft tissue and skin were re-approximated  wth absorbable suture.    The patient tolerated the procedure without any immediate complications, and was transferred to the ICU in guarded condition.  Shanina Kepple Bary Leriche

## 2020-04-28 NOTE — Brief Op Note (Signed)
04/27/2020 - 04/28/2020  12:06 PM  PATIENT:  Joshua Mack  79 y.o. male  PRE-OPERATIVE DIAGNOSIS:  STEMI  POST-OPERATIVE DIAGNOSIS:  STEMI  PROCEDURE:  Procedure(s): CORONARY ARTERY BYPASS GRAFTING (CABG) times four using LIMA to LAD; bilateral endoscopic greater saphenous vein harvest: SVG to Circ; SVG to RAMUS; and SVG to PDA. (N/A) Endovein Harvest Of Greater Saphenous Vein (Bilateral)  SURGEON:  Surgeon(s) and Role:    * Lightfoot, Eliezer Lofts, MD - Primary  PHYSICIAN ASSISTANT:  Jari Favre, PA-C  ANESTHESIA:   general  EBL:  800 mL   BLOOD ADMINISTERED:none  DRAINS:  ROUTINE    LOCAL MEDICATIONS USED:  NONE  SPECIMEN:  No Specimen  DISPOSITION OF SPECIMEN:  N/A  COUNTS:  YES  DICTATION: .Dragon Dictation  PLAN OF CARE: Admit to inpatient   PATIENT DISPOSITION:  ICU - intubated and hemodynamically stable.   Delay start of Pharmacological VTE agent (>24hrs) due to surgical blood loss or risk of bleeding: yes

## 2020-04-28 NOTE — Progress Notes (Addendum)
Progress Note  Patient Name: Joshua Mack Date of Encounter: 04/28/2020  Primary Cardiologist: No primary care provider on file. New - Dr. Swaziland  Subjective   Feels much better this AM - on NRB FM.  NO CP & notably imrpoved breathing.   K+ repleted this AM - new labs sent.  Inpatient Medications    Scheduled Meds: . aspirin EC  81 mg Oral Daily  . atorvastatin  40 mg Oral Daily  . bisacodyl  5 mg Oral Once  . [START ON 04/29/2020] chlorhexidine  15 mL Mouth/Throat Once  . Chlorhexidine Gluconate Cloth  6 each Topical Daily  . Chlorhexidine Gluconate Cloth  6 each Topical Once   And  . Chlorhexidine Gluconate Cloth  6 each Topical Once  . epinephrine  0-10 mcg/min Intravenous To OR  . heparin-papaverine-plasmalyte irrigation   Irrigation To OR  . insulin   Intravenous To OR  . [place] Blood Cardioplegia vial (lidocaine/magnesium/mannitol 0.26g-4g-6.4g)   Intracoronary To OR  . mouth rinse  15 mL Mouth Rinse BID  . [START ON 04/29/2020] metoprolol tartrate  12.5 mg Oral Once  . phenylephrine  30-200 mcg/min Intravenous To OR  . potassium chloride  80 mEq Other To OR  . potassium chloride  40 mEq Oral Q4H  . sodium chloride flush  10-40 mL Intracatheter Q12H  . sodium chloride flush  3 mL Intravenous Q12H  . tranexamic acid  15 mg/kg Intravenous To OR  . tranexamic acid  2 mg/kg Intracatheter To OR   Continuous Infusions: . sodium chloride 20 mL/hr at 04/28/20 0800  . sodium chloride    . sodium chloride 10 mL/hr at 04/28/20 0800  . dexmedetomidine    . heparin    . heparin 30,000 units/NS 1000 mL solution for CELLSAVER    . heparin 1,300 Units/hr (04/28/20 0800)  . levofloxacin (LEVAQUIN) IV    . milrinone    . nitroGLYCERIN    . norepinephrine    . tranexamic acid (CYKLOKAPRON) infusion ([facility])    . vancomycin     PRN Meds: sodium chloride, acetaminophen, docusate sodium, nitroGLYCERIN, ondansetron (ZOFRAN) IV, oxyCODONE, polyethylene glycol, sodium chloride  flush, sodium chloride flush, temazepam   Vital Signs    Vitals:   04/28/20 0419 04/28/20 0600 04/28/20 0655 04/28/20 0700  BP:      Pulse:      Resp: 18 18 18 17   Temp: 99.7 F (37.6 C) 99.7 F (37.6 C) 99.5 F (37.5 C) 99.5 F (37.5 C)  TempSrc:      SpO2: 94% 95% 97% 96%  Weight:   93.7 kg   Height:        Intake/Output Summary (Last 24 hours) at 04/28/2020 0820 Last data filed at 04/28/2020 0800 Gross per 24 hour  Intake 228.3 ml  Output 2450 ml  Net -2221.7 ml   Last 3 Weights 04/28/2020 04/27/2020 04/27/2020  Weight (lbs) 206 lb 9.1 oz 209 lb 6.4 oz 210 lb  Weight (kg) 93.7 kg 94.983 kg 95.255 kg      EKG     Initial EKG shows sinus rhythm rate 89 bpm.  PACs.  4-6 mm ST elevations in anterior/lateral leads (V2-be 4 less prominent in V5.  Also subtle elevation in aVL.  Depressions in II, III, aVF.   Follow-up EKG shows sinus rhythm with PACs, rate 80 bpm.  Less prominent ST elevations in V2 V3 and V4, subtle in V5.  T wave inversions in aVL.  No longer depressions  in inferior leads but borderline Q waves noted in inferior leads.  Inferior infarct, age undetermined, cannot exclude anterior septal infarct, age recent.- Personally Reviewed  Telemetry    NSR - no arrythmia- Personally Reviewed  Physical Exam    PAP 39/20 mmHg - mean ~32mmHg HCW:CBJSEGB in bed.  Comfortable with IABP & NRB FM  - talking in full sentences. No longer diaphoretic. Does look pale.  Neck: No JVD Cardiac: RRR, - distant S1& S2. No obvious M/R/G - (did not want to pause IABP) Respiratory: Clear to auscultation bilaterally with only mild basal rales.  GI: Soft, nontender, non-distended  MS: trace edema; No deformity. Neuro:  Nonfocal , A&O x 3 Psych: Normal affect   Labs    High Sensitivity Troponin:   Recent Labs  Lab 04/27/20 2052 04/27/20 2341 04/28/20 0409  TROPONINIHS 1,614* 22,234* >27,000*      Chemistry Recent Labs  Lab 04/27/20 2052 04/28/20 0409  NA 136 138  K  3.5 2.7*  CL 99 99  CO2 22 23  GLUCOSE 172* 139*  BUN 15 14  CREATININE 1.11 1.13  CALCIUM 9.5 9.1  PROT 8.1  --   ALBUMIN 4.5  --   AST 53*  --   ALT 30  --   ALKPHOS 67  --   BILITOT 1.0  --   GFRNONAA >60 >60  GFRAA >60 >60  ANIONGAP 15 16*     Hematology Recent Labs  Lab 04/27/20 2052 04/28/20 0409  WBC 15.0* 17.1*  RBC 5.41 5.23  HGB 16.4 15.8  HCT 47.5 46.5  MCV 87.8 88.9  MCH 30.3 30.2  MCHC 34.5 34.0  RDW 12.7 12.7  PLT 327 331    BNP Recent Labs  Lab 04/27/20 2052  BNP 103.3*     DDimer No results for input(s): DDIMER in the last 168 hours.   Radiology    CARDIAC CATHETERIZATION  Result Date: 04/27/2020  Prox RCA lesion is 100% stenosed.  Prox Cx to Mid Cx lesion is 100% stenosed.  Ramus lesion is 90% stenosed.  Mid LAD lesion is 100% stenosed.  LV end diastolic pressure is moderately elevated.  1. 3 vessel occlusive CAD.    - 100% mid LAD after a large diagonal branch. Faint right to left collaterals.    - 90% ramus intermediate    - 100% CTO of the mid LCx with good left to left collaterals    - 100% CTO of the proximal RCA. Good right to right and left to right collaterals. 2. Difficult to assess LV function due to underfilling. Will assess with Echo 3. Moderately elevated LV filling pressures. Improved with IV lasix and IABP 4. Normal right heart pressures 5. Preserved cardiac output 4.4 liters/min Plan: transfer to ICU. Heparinize. IABP support. CT surgery consultation for CABG. Assess LV function with Echo. Continue diuresis. Trend troponin levels.   DG Chest Portable 1 View  Result Date: 04/27/2020 CLINICAL DATA:  Shortness of breath. EXAM: PORTABLE CHEST 1 VIEW COMPARISON:  May 31, 2017 FINDINGS: Moderate severity diffusely increased interstitial lung markings are seen, bilaterally. This represents a new finding when compared to the prior study. There is no evidence of a pleural effusion or pneumothorax. The heart size and mediastinal contours  are within normal limits. Degenerative changes seen throughout the thoracic spine. 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 A superimposed component of acute interstitial edema cannot be excluded. Electronically Signed   By: Aram Candela M.D.   On: 04/27/2020  21:07    Cardiac Studies    04/27/2020: cardiac cath reviewed above: prox RCA 100%, p-mLCx 100%, RI 90%, mLAD (after D1) 100%. Moderately increase LVEDP.  Normal right heart cath pressures.  Preserved cardiac output of 4.4 L. ->  IABP placed.  CVTS consult.     04/28/2020: Echo pending  Patient Profile     79 y.o. male with history of hypertension, hyperlipidemia and spinal stenosis with severe chest pain on 04/24/2020 the last several hour.  Symptoms resolved when he started noting more shortness of breath and congestion symptoms.  On the morning of 510 he woke up with more discomfort in his chest and along with his dyspnea.  Symptoms seem to resolve after taking ibuprofen and aspirin.  Throughout the day he began to feel worse with diaphoresis swelling and diarrhea.  Became more short of breath and went to [facility] -> not necessarily having chest pain, mostly heart failure symptoms.  Anterolateral STE on EKG.  Transferred to [facility] from [place]. [facility] for Cardiac Cath revealed severe multivessel disease with likely culprit being occluded LAD after diagonal branch with left to right collaterals and left left collaterals.  IABP placed because of elevated LVEDP, tachycardia and dyspnea/hypoxia.  After IABP placement and diuresis, patient markedly improved.  CVTS consultation-seen by Dr. Kipp Brood with plans for CABG today following echocardiogram.  Assessment & Plan    Principal Problem:   Acute ST elevation myocardial infarction (STEMI) of anterolateral wall (HCC)-likely delayed presentation Active Problems:   Multiple vessel coronary artery disease: Acute mid LAD 100% occlusion, 90% RI and CTO of  LCx and RCA   Acute combined systolic and diastolic heart failure (HCC)   Essential hypertension   Spinal stenosis at L4-L5 level   Respiratory failure with hypoxia (HCC)   OSA (obstructive sleep apnea) HypoKalemia -- repleted this AM  MV CAD on CATH from likely delayed presentation of Ant STEMI (presented with CHF Sx 3 d post, LAD is culprit,but has 2 V CTO & 90% RI - excellent collaterals) -- Echo being done currently.  Resp Failure notably improved with diuresis, remains SaO2 in high 80s off O2 -- using NRB - feels better.  Brisk UOP with IV Lasix & IABP - looks much better; No Pressors. (no Shock).  ON heparin with IABP. SWan in place.   Plan per Dr.Lightfoot c/s - OR today after Echo.   Would hold off on Beta Blocker  High dose/ high intensity statin - recheck lipids in AM.   Will need CPAP post OP  Cards will follow along peripherally post-op.  Will be available as needed.    For questions or updates, please contact [place] Please consult [email or web address] for contact info under        Signed, Glenetta Hew, MD  04/28/2020, 8:20 AM

## 2020-04-28 NOTE — Progress Notes (Signed)
  Echocardiogram Echocardiogram Transesophageal has been performed.  Joshua Mack 04/28/2020, 12:32 PM

## 2020-04-28 NOTE — Plan of Care (Signed)
  Problem: Education: Goal: Understanding of cardiac disease, CV risk reduction, and recovery process will improve Outcome: Progressing Goal: Understanding of medication regimen will improve Outcome: Progressing   Problem: Cardiac: Goal: Vascular access site(s) Level 0-1 will be maintained Outcome: Progressing

## 2020-04-28 NOTE — Consult Note (Signed)
[address]       [place] [address]             [phone]        Joshua Mack [place] Medical Record #[id number] Date of Birth: 25-Nov-1941  Referring: No ref. provider found Primary Care: Shirline Frees, MD Primary Cardiologist:No primary care provider on file.  Chief Complaint:    Chief Complaint  Patient presents with  . Shortness of Breath    History of Present Illness:     79 yo male admitted transferred from [facility] with an STEMI.  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  A balloon pump was placed, and he was then transferred to the ICU.  He has been chest pain free since the [facility].   Past Medical and Surgical History: Previous Chest Surgery: none Previous Chest Radiation: non Diabetes Mellitus: yes.  HbA1C 5.7 Creatinine: 1.1  Past Medical History:  Diagnosis Date  . High cholesterol   . Hypertension   . Sleep apnea     Past Surgical History:  Procedure Laterality Date  . IABP INSERTION N/A 04/27/2020   Procedure: IABP Insertion;  Surgeon: Martinique, Peter M, MD;  Location: [place] [facility];  Service: Cardiovascular;  Laterality: N/A;  . LUMBAR LAMINECTOMY/DECOMPRESSION MICRODISCECTOMY N/A 05/03/2017   Procedure: Microlumbar decompression L4-5, L3-4,  L5-S1 WITH LATERAL AUTOGRAFT FUSION;  Surgeon: Susa Day, MD;  Location: [facility];  Service: Orthopedics;  Laterality: N/A;  . RIGHT/LEFT HEART CATH AND CORONARY ANGIOGRAPHY N/A 04/27/2020   Procedure: RIGHT/LEFT HEART CATH AND CORONARY ANGIOGRAPHY;  Surgeon: Martinique, Peter M, MD;  Location: [place] [facility];  Service: Cardiovascular;  Laterality: N/A;    Social History: Support: live with wife  Social History   Tobacco Use  Smoking Status Never Smoker  Smokeless Tobacco Never Used    Social History   Substance and Sexual Activity  Alcohol Use No     Allergies  Allergen Reactions  . Fentanyl Other (See Comments)    Had a  reaction during a colonoscopy in the past. He was told it required Benadryl.  . Midazolam Other (See Comments)    Had a reaction during a colonoscopy in the past. He was told it required Benadryl.  . Amoxicillin Rash    Has patient had a PCN reaction causing immediate rash, facial/tongue/throat swelling, SOB or lightheadedness with hypotension: No Has patient had a PCN reaction causing severe rash involving mucus membranes or skin necrosis: No Has patient had a PCN reaction that required hospitalization: No Has patient had a PCN reaction occurring within the last 10 years: No If all of the above answers are "NO", then may proceed with Cephalosporin use.   . Tavist Nd [Loratadine] Rash    Current Facility-Administered Medications  Medication Dose Route Frequency Provider Last Rate Last Admin  . 0.9 %  sodium chloride infusion   Intravenous Continuous Coniglio, Alcario Drought, MD 20 mL/hr at 04/28/20 0251 New Bag at 04/28/20 0251  . 0.9 %  sodium chloride infusion  250 mL Intravenous PRN Coniglio, Amanda C, MD      . 0.9 %  sodium chloride infusion   Intravenous Continuous Coniglio, Alcario Drought, MD 10 mL/hr at 04/28/20 0252 New Bag at 04/28/20 0252  . acetaminophen (TYLENOL) tablet 650 mg  650 mg Oral Q8H PRN Coniglio, Alcario Drought, MD      . aspirin EC tablet  81 mg  81 mg Oral Daily Coniglio, Amanda C, MD      . atorvastatin (LIPITOR) tablet 40 mg  40 mg Oral Daily Coniglio, Amanda C, MD      . Chlorhexidine Gluconate Cloth 2 % PADS 6 each  6 each Topical Daily Coniglio, Amanda C, MD      . docusate sodium (COLACE) capsule 100 mg  100 mg Oral BID PRN Coniglio, Harlen Labs, MD      . heparin 25000-0.45 UT/250ML-% infusion           . heparin ADULT infusion 100 units/mL (25000 units/276mL sodium chloride 0.45%)  1,300 Units/hr Intravenous Continuous Coniglio, Harlen Labs, MD 13 mL/hr at 04/28/20 0253 1,300 Units/hr at 04/28/20 0253  . MEDLINE mouth rinse  15 mL Mouth Rinse BID Coniglio, Amanda C, MD      .  nitroGLYCERIN (NITROSTAT) SL tablet 0.4 mg  0.4 mg Sublingual Q5 Min x 3 PRN Coniglio, Harlen Labs, MD      . ondansetron (ZOFRAN) injection 4 mg  4 mg Intravenous Q6H PRN Coniglio, Amanda C, MD      . oxyCODONE (Oxy IR/ROXICODONE) immediate release tablet 5-10 mg  5-10 mg Oral Q4H PRN Coniglio, Amanda C, MD      . polyethylene glycol (MIRALAX / GLYCOLAX) packet 17 g  17 g Oral Daily PRN Coniglio, Amanda C, MD      . potassium chloride SA (KLOR-CON) CR tablet 40 mEq  40 mEq Oral Q4H Meng, Wynema Birch, PA   40 mEq at 04/28/20 0702  . sodium chloride flush (NS) 0.9 % injection 10-40 mL  10-40 mL Intracatheter Q12H Coniglio, Amanda C, MD      . sodium chloride flush (NS) 0.9 % injection 10-40 mL  10-40 mL Intracatheter PRN Coniglio, Amanda C, MD      . sodium chloride flush (NS) 0.9 % injection 3 mL  3 mL Intravenous Q12H Coniglio, Amanda C, MD      . sodium chloride flush (NS) 0.9 % injection 3 mL  3 mL Intravenous PRN Coniglio, Harlen Labs, MD        Medications Prior to Admission  Medication Sig Dispense Refill Last Dose  . acetaminophen (TYLENOL) 500 MG tablet Take 1,000 mg by mouth 2 (two) times daily.   Past Week at Unknown time  . amLODipine (NORVASC) 5 MG tablet Take 10 mg by mouth daily.    04/27/2020 at Unknown time  . docusate sodium (COLACE) 100 MG capsule Take 1 capsule (100 mg total) by mouth 2 (two) times daily as needed for mild constipation. 30 capsule 1 Past Week at Unknown time  . Melatonin 5 MG TABS Take 5 mg by mouth at bedtime.     . Misc Natural Products (OSTEO BI-FLEX ADV JOINT SHIELD) TABS Take 1 tablet by mouth daily.     . Multiple Vitamin (MULTIVITAMIN WITH MINERALS) TABS tablet Take 1 tablet by mouth daily.     . Omega-3 Krill Oil 500 MG CAPS Take 500 mg by mouth daily.     Marland Kitchen Resveratrol 250 MG CAPS Take 250 mg by mouth daily.     . Turmeric Curcumin 500 MG CAPS Take 500 mg by mouth daily.     Marland Kitchen Ubiquinone (ULTRA COQ10 PO) Take 1 capsule by mouth daily.     Marland Kitchen Bioflavonoid Products  (ESTER-C) TABS Take 1 tablet by mouth daily.     . cholecalciferol (VITAMIN D) 1000 units tablet Take 1,000 Units by mouth daily.     Marland Kitchen  Chromium 1000 MCG TABS Take 1,000 mcg by mouth daily.     . Garlic 1000 MG CAPS Take 3,000 mg by mouth daily.     Marland Kitchen L-Arginine 500 MG CAPS Take 500 mg by mouth daily.     . Misc Natural Products (URINOZINC PLUS) TABS Take 1 tablet by mouth daily.     . Plant Sterols and Stanols (CHOLEST OFF PO) Take 1 capsule by mouth daily.     . polyethylene glycol (MIRALAX / GLYCOLAX) packet Take 17 g by mouth daily. 14 each 0     No family history on file.   Review of Systems:   Review of Systems  Respiratory: Positive for shortness of breath.   Cardiovascular: Positive for chest pain.      Physical Exam: BP (!) 164/105   Pulse (!) 243   Temp 99.5 F (37.5 C)   Resp 18   Ht 5\' 10"  (1.778 m)   Wt 93.7 kg   SpO2 97%   BMI 29.64 kg/m  Physical Exam  Constitutional: He is oriented to person, place, and time and well-developed, well-nourished, and in no distress. No distress.  HENT:  Head: Normocephalic and atraumatic.  Eyes: Conjunctivae are normal.  Cardiovascular: Normal rate and regular rhythm.  No murmur heard. Pulmonary/Chest: Effort normal. No respiratory distress.  Abdominal: Soft. He exhibits no distension.  Musculoskeletal:        General: No edema.     Cervical back: Normal range of motion.  Neurological: He is alert and oriented to person, place, and time.  Skin: Skin is warm and dry. He is not diaphoretic.      Diagnostic Studies & Laboratory data:    Left Heart Catherization: Multiple CTO lesion with good collateral filling.  Good targets in the RCA, LAD, and circ territory.  Also has a tight ramus lesion Echo: pending   I have independently reviewed the above radiologic studies and discussed with the patient   Recent Lab Findings: Lab Results  Component Value Date   WBC 17.1 (H) 04/28/2020   HGB 15.8 04/28/2020   HCT 46.5  04/28/2020   PLT 331 04/28/2020   GLUCOSE 139 (H) 04/28/2020   CHOL 280 (H) 04/27/2020   TRIG 149 04/27/2020   HDL 57 04/27/2020   LDLCALC NOT CALCULATED 04/27/2020   ALT 30 04/27/2020   AST 53 (H) 04/27/2020   NA 138 04/28/2020   K 2.7 (LL) 04/28/2020   CL 99 04/28/2020   CREATININE 1.13 04/28/2020   BUN 14 04/28/2020   CO2 23 04/28/2020   INR 0.9 04/27/2020   HGBA1C 5.7 (H) 04/27/2020      Assessment / Plan:   79 yo male with 3V CAD, and IABP insertion.  Presented with STEMI, currently chest pain free.  OR today for CABG 4. Awaiting Echo.     I  spent 30 minutes counseling the patient face to face.   70 04/28/2020 7:03 AM

## 2020-04-28 NOTE — Progress Notes (Signed)
ANTICOAGULATION CONSULT NOTE - Initial Consult  Pharmacy Consult for Heparin Indication: IABP  Allergies  Allergen Reactions  . Fentanyl Other (See Comments)    Had a reaction during a colonoscopy in the past. He was told it required Benadryl.  . Midazolam Other (See Comments)    Had a reaction during a colonoscopy in the past. He was told it required Benadryl.  . Amoxicillin Rash    Has patient had a PCN reaction causing immediate rash, facial/tongue/throat swelling, SOB or lightheadedness with hypotension: No Has patient had a PCN reaction causing severe rash involving mucus membranes or skin necrosis: No Has patient had a PCN reaction that required hospitalization: No Has patient had a PCN reaction occurring within the last 10 years: No If all of the above answers are "NO", then may proceed with Cephalosporin use.   . Tavist Nd [Loratadine] Rash    Patient Measurements: Height: 5\' 10"  (177.8 cm) Weight: 95 kg (209 lb 6.4 oz) IBW/kg (Calculated) : 73  Vital Signs: Temp: 99.7 F (37.6 C) (05/11 0100) Temp Source: Oral (05/10 2039) BP: 164/105 (05/10 2308) Pulse Rate: 86 (05/10 2308)  Labs: Recent Labs    04/27/20 2052  HGB 16.4  HCT 47.5  PLT 327  APTT 26  LABPROT 11.7  INR 0.9  CREATININE 1.11  TROPONINIHS 1,614*    Estimated Creatinine Clearance: 63.5 mL/min (by C-G formula based on SCr of 1.11 mg/dL).   Medical History: Past Medical History:  Diagnosis Date  . High cholesterol   . Hypertension   . Sleep apnea     Medications:  Medications Prior to Admission  Medication Sig Dispense Refill Last Dose  . acetaminophen (TYLENOL) 500 MG tablet Take 1,000 mg by mouth 2 (two) times daily.   Past Week at Unknown time  . amLODipine (NORVASC) 5 MG tablet Take 10 mg by mouth daily.    04/27/2020 at Unknown time  . docusate sodium (COLACE) 100 MG capsule Take 1 capsule (100 mg total) by mouth 2 (two) times daily as needed for mild constipation. 30 capsule 1 Past  Week at Unknown time  . Melatonin 5 MG TABS Take 5 mg by mouth at bedtime.     . Misc Natural Products (OSTEO BI-FLEX ADV JOINT SHIELD) TABS Take 1 tablet by mouth daily.     . Multiple Vitamin (MULTIVITAMIN WITH MINERALS) TABS tablet Take 1 tablet by mouth daily.     . Omega-3 Krill Oil 500 MG CAPS Take 500 mg by mouth daily.     Marland Kitchen Resveratrol 250 MG CAPS Take 250 mg by mouth daily.     . Turmeric Curcumin 500 MG CAPS Take 500 mg by mouth daily.     Marland Kitchen Ubiquinone (ULTRA COQ10 PO) Take 1 capsule by mouth daily.     Marland Kitchen Bioflavonoid Products (ESTER-C) TABS Take 1 tablet by mouth daily.     . cholecalciferol (VITAMIN D) 1000 units tablet Take 1,000 Units by mouth daily.     . Chromium 1000 MCG TABS Take 1,000 mcg by mouth daily.     . Garlic 9381 MG CAPS Take 3,000 mg by mouth daily.     Marland Kitchen L-Arginine 500 MG CAPS Take 500 mg by mouth daily.     . Misc Natural Products (URINOZINC PLUS) TABS Take 1 tablet by mouth daily.     . Plant Sterols and Stanols (CHOLEST OFF PO) Take 1 capsule by mouth daily.     . polyethylene glycol (MIRALAX / GLYCOLAX) packet Take  17 g by mouth daily. 14 each 0     Assessment: 79 y.o. male admitted s/p STEMI, found to have sevre 3 vessel CAD and awaiting possible CABG, IABP in place, for heparin  Goal of Therapy:  Heparin level 0.2-0.5 units/mL Monitor platelets by anticoagulation protocol: Yes   Plan:  Start heparin 1300 units/hr Check heparin level in 8 hours.   Eddie Candle 04/28/2020,2:03 AM

## 2020-04-28 NOTE — Anesthesia Preprocedure Evaluation (Signed)
Anesthesia Evaluation  Patient identified by MRN, date of birth, ID band Patient awake    Reviewed: Allergy & Precautions, NPO status , Patient's Chart, lab work & pertinent test results  Airway Mallampati: IV       Dental no notable dental hx. (+) Teeth Intact, Dental Advisory Given   Pulmonary sleep apnea ,    Pulmonary exam normal breath sounds clear to auscultation       Cardiovascular hypertension, Pt. on medications + CAD and + Past MI  Normal cardiovascular exam Rhythm:Regular Rate:Normal     Neuro/Psych negative neurological ROS  negative psych ROS   GI/Hepatic negative GI ROS, Neg liver ROS,   Endo/Other  negative endocrine ROS  Renal/GU negative Renal ROS  negative genitourinary   Musculoskeletal   Abdominal Normal abdominal exam  (+)   Peds  Hematology negative hematology ROS (+)   Anesthesia Other Findings   Reproductive/Obstetrics                             Anesthesia Physical  Anesthesia Plan  ASA: IV and emergent  Anesthesia Plan: General   Post-op Pain Management:    Induction: Intravenous  PONV Risk Score and Plan: 2 and Midazolam and Treatment may vary due to age or medical condition  Airway Management Planned: Oral ETT and Video Laryngoscope Planned  Additional Equipment: Arterial line, PA Cath, CVP and TEE  Intra-op Plan: Utilization Of Total Body Hypothermia per surgeon request  Post-operative Plan: Post-operative intubation/ventilation  Informed Consent: I have reviewed the patients History and Physical, chart, labs and discussed the procedure including the risks, benefits and alternatives for the proposed anesthesia with the patient or authorized representative who has indicated his/her understanding and acceptance.     Dental advisory given  Plan Discussed with: CRNA and Surgeon  Anesthesia Plan Comments:         Anesthesia Quick  Evaluation

## 2020-04-29 ENCOUNTER — Inpatient Hospital Stay (HOSPITAL_COMMUNITY): Payer: Commercial Managed Care - PPO

## 2020-04-29 ENCOUNTER — Encounter: Payer: Self-pay | Admitting: *Deleted

## 2020-04-29 LAB — BASIC METABOLIC PANEL
Anion gap: 7 (ref 5–15)
Anion gap: 9 (ref 5–15)
BUN: 16 mg/dL (ref 8–23)
BUN: 23 mg/dL (ref 8–23)
CO2: 23 mmol/L (ref 22–32)
CO2: 21 mmol/L — ABNORMAL LOW (ref 22–32)
Calcium: 7.5 mg/dL — ABNORMAL LOW (ref 8.9–10.3)
Calcium: 7.8 mg/dL — ABNORMAL LOW (ref 8.9–10.3)
Chloride: 107 mmol/L (ref 98–111)
Chloride: 104 mmol/L (ref 98–111)
Creatinine, Ser: 1.34 mg/dL — ABNORMAL HIGH (ref 0.61–1.24)
Creatinine, Ser: 1.53 mg/dL — ABNORMAL HIGH (ref 0.61–1.24)
GFR calc Af Amer: 58 mL/min — ABNORMAL LOW (ref >60)
GFR calc Af Amer: 50 mL/min — ABNORMAL LOW (ref >60)
GFR calc non Af Amer: 50 mL/min — ABNORMAL LOW (ref >60)
GFR calc non Af Amer: 43 mL/min — ABNORMAL LOW (ref >60)
Glucose, Bld: 138 mg/dL — ABNORMAL HIGH (ref 70–99)
Glucose, Bld: 139 mg/dL — ABNORMAL HIGH (ref 70–99)
Potassium: 3.7 mmol/L (ref 3.5–5.1)
Potassium: 3.4 mmol/L — ABNORMAL LOW (ref 3.5–5.1)
Sodium: 137 mmol/L (ref 135–145)
Sodium: 134 mmol/L — ABNORMAL LOW (ref 135–145)

## 2020-04-29 LAB — GLUCOSE, CAPILLARY
Glucose-Capillary: 116 mg/dL — ABNORMAL HIGH (ref 70–99)
Glucose-Capillary: 126 mg/dL — ABNORMAL HIGH (ref 70–99)
Glucose-Capillary: 132 mg/dL — ABNORMAL HIGH (ref 70–99)
Glucose-Capillary: 135 mg/dL — ABNORMAL HIGH (ref 70–99)
Glucose-Capillary: 135 mg/dL — ABNORMAL HIGH (ref 70–99)
Glucose-Capillary: 136 mg/dL — ABNORMAL HIGH (ref 70–99)
Glucose-Capillary: 136 mg/dL — ABNORMAL HIGH (ref 70–99)
Glucose-Capillary: 137 mg/dL — ABNORMAL HIGH (ref 70–99)
Glucose-Capillary: 137 mg/dL — ABNORMAL HIGH (ref 70–99)
Glucose-Capillary: 143 mg/dL — ABNORMAL HIGH (ref 70–99)
Glucose-Capillary: 144 mg/dL — ABNORMAL HIGH (ref 70–99)
Glucose-Capillary: 158 mg/dL — ABNORMAL HIGH (ref 70–99)
Glucose-Capillary: 96 mg/dL (ref 70–99)

## 2020-04-29 LAB — POCT I-STAT 7, (LYTES, BLD GAS, ICA,H+H)
Acid-base deficit: 1 mmol/L (ref 0.0–2.0)
Acid-base deficit: 2 mmol/L (ref 0.0–2.0)
Acid-base deficit: 4 mmol/L — ABNORMAL HIGH (ref 0.0–2.0)
Acid-base deficit: 5 mmol/L — ABNORMAL HIGH (ref 0.0–2.0)
Acid-base deficit: 5 mmol/L — ABNORMAL HIGH (ref 0.0–2.0)
Bicarbonate: 21.1 mmol/L (ref 20.0–28.0)
Bicarbonate: 21.3 mmol/L (ref 20.0–28.0)
Bicarbonate: 21.8 mmol/L (ref 20.0–28.0)
Bicarbonate: 22.2 mmol/L (ref 20.0–28.0)
Bicarbonate: 23.8 mmol/L (ref 20.0–28.0)
Calcium, Ion: 1.08 mmol/L — ABNORMAL LOW (ref 1.15–1.40)
Calcium, Ion: 1.12 mmol/L — ABNORMAL LOW (ref 1.15–1.40)
Calcium, Ion: 1.13 mmol/L — ABNORMAL LOW (ref 1.15–1.40)
Calcium, Ion: 1.14 mmol/L — ABNORMAL LOW (ref 1.15–1.40)
Calcium, Ion: 1.15 mmol/L (ref 1.15–1.40)
HCT: 23 % — ABNORMAL LOW (ref 39.0–52.0)
HCT: 24 % — ABNORMAL LOW (ref 39.0–52.0)
HCT: 24 % — ABNORMAL LOW (ref 39.0–52.0)
HCT: 24 % — ABNORMAL LOW (ref 39.0–52.0)
HCT: 24 % — ABNORMAL LOW (ref 39.0–52.0)
Hemoglobin: 7.8 g/dL — ABNORMAL LOW (ref 13.0–17.0)
Hemoglobin: 8.2 g/dL — ABNORMAL LOW (ref 13.0–17.0)
Hemoglobin: 8.2 g/dL — ABNORMAL LOW (ref 13.0–17.0)
Hemoglobin: 8.2 g/dL — ABNORMAL LOW (ref 13.0–17.0)
Hemoglobin: 8.2 g/dL — ABNORMAL LOW (ref 13.0–17.0)
O2 Saturation: 86 %
O2 Saturation: 90 %
O2 Saturation: 92 %
O2 Saturation: 93 %
O2 Saturation: 94 %
Patient temperature: 37.1
Patient temperature: 37.5
Patient temperature: 38
Patient temperature: 38.1
Patient temperature: 38.5
Potassium: 3.8 mmol/L (ref 3.5–5.1)
Potassium: 3.9 mmol/L (ref 3.5–5.1)
Potassium: 3.9 mmol/L (ref 3.5–5.1)
Potassium: 4 mmol/L (ref 3.5–5.1)
Potassium: 4.2 mmol/L (ref 3.5–5.1)
Sodium: 139 mmol/L (ref 135–145)
Sodium: 140 mmol/L (ref 135–145)
Sodium: 141 mmol/L (ref 135–145)
Sodium: 141 mmol/L (ref 135–145)
Sodium: 142 mmol/L (ref 135–145)
TCO2: 22 mmol/L (ref 22–32)
TCO2: 23 mmol/L (ref 22–32)
TCO2: 23 mmol/L (ref 22–32)
TCO2: 23 mmol/L (ref 22–32)
TCO2: 25 mmol/L (ref 22–32)
pCO2 arterial: 39.2 mmHg (ref 32.0–48.0)
pCO2 arterial: 39.2 mmHg (ref 32.0–48.0)
pCO2 arterial: 43.9 mmHg (ref 32.0–48.0)
pCO2 arterial: 45.7 mmHg (ref 32.0–48.0)
pCO2 arterial: 45.8 mmHg (ref 32.0–48.0)
pH, Arterial: 7.277 — ABNORMAL LOW (ref 7.350–7.450)
pH, Arterial: 7.287 — ABNORMAL LOW (ref 7.350–7.450)
pH, Arterial: 7.343 — ABNORMAL LOW (ref 7.350–7.450)
pH, Arterial: 7.347 — ABNORMAL LOW (ref 7.350–7.450)
pH, Arterial: 7.368 (ref 7.350–7.450)
pO2, Arterial: 60 mmHg — ABNORMAL LOW (ref 83.0–108.0)
pO2, Arterial: 66 mmHg — ABNORMAL LOW (ref 83.0–108.0)
pO2, Arterial: 70 mmHg — ABNORMAL LOW (ref 83.0–108.0)
pO2, Arterial: 76 mmHg — ABNORMAL LOW (ref 83.0–108.0)
pO2, Arterial: 76 mmHg — ABNORMAL LOW (ref 83.0–108.0)

## 2020-04-29 LAB — CBC
HCT: 25.4 % — ABNORMAL LOW (ref 39.0–52.0)
HCT: 24.2 % — ABNORMAL LOW (ref 39.0–52.0)
Hemoglobin: 8.2 g/dL — ABNORMAL LOW (ref 13.0–17.0)
Hemoglobin: 8 g/dL — ABNORMAL LOW (ref 13.0–17.0)
MCH: 30 pg (ref 26.0–34.0)
MCH: 30.8 pg (ref 26.0–34.0)
MCHC: 32.3 g/dL (ref 30.0–36.0)
MCHC: 33.1 g/dL (ref 30.0–36.0)
MCV: 93 fL (ref 80.0–100.0)
MCV: 93.1 fL (ref 80.0–100.0)
Platelets: 126 10*3/uL — ABNORMAL LOW (ref 150–400)
Platelets: 128 10*3/uL — ABNORMAL LOW (ref 150–400)
RBC: 2.73 MIL/uL — ABNORMAL LOW (ref 4.22–5.81)
RBC: 2.6 MIL/uL — ABNORMAL LOW (ref 4.22–5.81)
RDW: 12.9 % (ref 11.5–15.5)
RDW: 13.2 % (ref 11.5–15.5)
WBC: 12.2 10*3/uL — ABNORMAL HIGH (ref 4.0–10.5)
WBC: 13.4 10*3/uL — ABNORMAL HIGH (ref 4.0–10.5)
nRBC: 0 % (ref 0.0–0.2)
nRBC: 0 % (ref 0.0–0.2)

## 2020-04-29 LAB — MAGNESIUM
Magnesium: 2.7 mg/dL — ABNORMAL HIGH (ref 1.7–2.4)
Magnesium: 2.6 mg/dL — ABNORMAL HIGH (ref 1.7–2.4)

## 2020-04-29 MED ORDER — SODIUM CHLORIDE 0.9% FLUSH: 10.0000 mL | INTRAVENOUS | Status: DC | PRN

## 2020-04-29 MED ORDER — POTASSIUM CHLORIDE CRYS ER 20 MEQ PO TBCR
20.0000 meq | EXTENDED_RELEASE_TABLET | ORAL | Status: AC
  Administered 2020-04-29 – 2020-04-30 (×3): 20 meq via ORAL
  Filled 2020-04-29 (×3): qty 1

## 2020-04-29 MED ORDER — INSULIN ASPART 100 UNIT/ML ~~LOC~~ SOLN
0.0000 [IU] | SUBCUTANEOUS | Status: DC
  Administered 2020-04-29 – 2020-05-01 (×8): 2 [IU] via SUBCUTANEOUS

## 2020-04-29 MED ORDER — POTASSIUM CHLORIDE 10 MEQ/50ML IV SOLN
INTRAVENOUS | Status: AC
  Filled 2020-04-29: qty 50

## 2020-04-29 MED ORDER — SODIUM CHLORIDE 0.9% FLUSH
10.0000 mL | Freq: Two times a day (BID) | INTRAVENOUS | Status: DC
  Administered 2020-04-29 – 2020-04-30 (×2): 10 mL

## 2020-04-29 MED ORDER — SODIUM CHLORIDE 0.9% FLUSH
10.0000 mL | Freq: Two times a day (BID) | INTRAVENOUS | Status: DC
  Administered 2020-04-29: 10 mL

## 2020-04-29 MED ORDER — ENOXAPARIN SODIUM 40 MG/0.4ML ~~LOC~~ SOLN
40.0000 mg | Freq: Every day | SUBCUTANEOUS | Status: DC
  Administered 2020-04-30 – 2020-05-02 (×3): 40 mg via SUBCUTANEOUS
  Filled 2020-04-29 (×4): qty 0.4

## 2020-04-29 MED ORDER — POTASSIUM CHLORIDE 10 MEQ/50ML IV SOLN
10.0000 meq | INTRAVENOUS | Status: AC
  Administered 2020-04-29: 10 meq via INTRAVENOUS
  Filled 2020-04-29: qty 50

## 2020-04-29 NOTE — Progress Notes (Addendum)
T[facility] DAILY ICU PROGRESS NOTE                   [address]            [place] [address]          [phone]   1 Day Post-Op Procedure(s) (LRB): CORONARY ARTERY BYPASS GRAFTING (CABG) times four using LIMA to LAD; bilateral endoscopic greater saphenous vein harvest: SVG to Circ; SVG to RAMUS; and SVG to PDA. (N/A) Endovein Harvest Of Greater Saphenous Vein (Bilateral)  Total Length of Stay:  LOS: 2 days   Subjective: Alert, intubated but says he's hungry  Objective: Vital signs in last 24 hours: Temp:  [96.1 F (35.6 C)-101.3 F (38.5 C)] 100.6 F (38.1 C) (05/12 0804) Pulse Rate:  [67-94] 86 (05/12 0804) Cardiac Rhythm: Normal sinus rhythm (05/12 0424) Resp:  [12-25] 19 (05/12 0804) BP: (71-140)/(54-99) 71/54 (05/12 0804) SpO2:  [89 %-100 %] 97 % (05/12 0804) Arterial Line BP: (70-157)/(44-80) 77/58 (05/12 0700) FiO2 (%):  [40 %-70 %] 40 % (05/12 0804) Weight:  [98 kg] 98 kg (05/12 0424)  Filed Weights   04/27/20 2055 04/28/20 0655 04/29/20 0424  Weight: 95 kg 93.7 kg 98 kg    Weight change: 2.745 kg   Hemodynamic parameters for last 24 hours: PAP: (24-57)/(11-35) 24/11 CVP:  [5 mmHg-24 mmHg] 5 mmHg CO:  [2.9 L/min-7.3 L/min] 4.9 L/min CI:  [1.4 L/min/m2-3.4 L/min/m2] 2.3 L/min/m2  Intake/Output from previous day: 05/11 0701 - 05/12 0700 In: 6079.1 [I.V.:3888.2; Blood:470; IV Piggyback:1720.9] Out: 3419 [Urine:2408; Blood:800; Chest Tube:620]   Vent Mode: SIMV;PSV;PRVC FiO2 (%):  [40 %-70 %] 40 % Set Rate:  [4 bmp-14 bmp] 4 bmp Vt Set:  [580 mL] 580 mL PEEP:  [5 cmH20] 5 cmH20 Pressure Support:  [10 cmH20] 10 cmH20 Plateau Pressure:  [17 cmH20-19 cmH20] 19 cmH20    Intake/Output this shift: No intake/output data recorded.  Current Meds: Scheduled Meds: . acetaminophen  1,000 mg Oral Q6H   Or  . acetaminophen (TYLENOL) oral liquid 160 mg/5 mL  1,000 mg Per Tube Q6H  . acetaminophen (TYLENOL) oral liquid 160 mg/5 mL  650 mg Per  Tube Once   Or  . acetaminophen  650 mg Rectal Once  . aspirin EC  325 mg Oral Daily   Or  . aspirin  324 mg Per Tube Daily  . atorvastatin  40 mg Oral Daily  . bisacodyl  10 mg Oral Daily   Or  . bisacodyl  10 mg Rectal Daily  . chlorhexidine  15 mL Mouth/Throat NOW  . Chlorhexidine Gluconate Cloth  6 each Topical Daily  . docusate sodium  200 mg Oral Daily  . metoprolol tartrate  12.5 mg Oral BID   Or  . metoprolol tartrate  12.5 mg Per Tube BID  . [START ON 04/30/2020] pantoprazole  40 mg Oral Daily  . sodium chloride flush  10-40 mL Intracatheter Q12H  . sodium chloride flush  3 mL Intravenous Q12H   Continuous Infusions: . sodium chloride 20 mL/hr at 04/28/20 2300  . sodium chloride    . sodium chloride 10 mL/hr at 04/28/20 1821  . albumin human 12.5 g (04/28/20 1946)  . dexmedetomidine (PRECEDEX) IV infusion 0.7 mcg/kg/hr (04/28/20 2200)  . DOBUTamine 2.5 mcg/kg/min (04/28/20 2200)  . insulin 0.8 mL/hr at 04/28/20 2200  . lactated ringers    . lactated ringers    . lactated ringers 20 mL/hr at 04/28/20 2200  .  levofloxacin (LEVAQUIN) IV    . niCARDipine    . norepinephrine (LEVOPHED) Adult infusion 1 mcg/min (04/29/20 0531)  . potassium chloride 10 mEq (04/29/20 3016)  . potassium chloride     PRN Meds:.sodium chloride, albumin human, dextrose, lactated ringers, metoprolol tartrate, morphine injection, ondansetron (ZOFRAN) IV, oxyCODONE, sodium chloride flush, sodium chloride flush, traMADol  General appearance: alert, cooperative and no distress Neurologic: intact Heart: regular rate and rhythm Lungs: clear ant/lat Abdomen: soft, nontender Extremities: min edema Wound: dressings intact, min drainage  Lab Results: CBC: Recent Labs    04/28/20 1805 04/28/20 2032 04/29/20 0412 04/29/20 0531  WBC 14.0*  --   --  12.2*  HGB 9.8*   < > 8.2* 8.2*  HCT 29.1*   < > 24.0* 25.4*  PLT 136*  --   --  126*   < > = values in this interval not displayed.   BMET:    Recent Labs    04/28/20 0409 04/28/20 0737 04/28/20 1622 04/28/20 1801 04/29/20 0412 04/29/20 0531  NA 138   < > 139  139   < > 140 137  K 2.7*   < > 3.4*  3.4*   < > 3.9 3.7  CL 99   < > 103  --   --  107  CO2 23  --   --   --   --  23  GLUCOSE 139*   < > 123*  --   --  138*  BUN 14   < > 14  --   --  16  CREATININE 1.13   < > 1.10  --   --  1.34*  CALCIUM 9.1  --   --   --   --  7.5*   < > = values in this interval not displayed.    CMET: Lab Results  Component Value Date   WBC 12.2 (H) 04/29/2020   HGB 8.2 (L) 04/29/2020   HCT 25.4 (L) 04/29/2020   PLT 126 (L) 04/29/2020   GLUCOSE 138 (H) 04/29/2020   CHOL 280 (H) 04/27/2020   TRIG 149 04/27/2020   HDL 57 04/27/2020   LDLCALC NOT CALCULATED 04/27/2020   ALT 30 04/27/2020   AST 53 (H) 04/27/2020   NA 137 04/29/2020   K 3.7 04/29/2020   CL 107 04/29/2020   CREATININE 1.34 (H) 04/29/2020   BUN 16 04/29/2020   CO2 23 04/29/2020   INR 1.5 (H) 04/28/2020   HGBA1C 5.7 (H) 04/27/2020   ABG    Component Value Date/Time   PHART 7.368 04/29/2020 0412   PCO2ART 39.2 04/29/2020 0412   PO2ART 70 (L) 04/29/2020 0412   HCO3 22.2 04/29/2020 0412   TCO2 23 04/29/2020 0412   ACIDBASEDEF 2.0 04/29/2020 0412   O2SAT 92.0 04/29/2020 0412     PT/INR:  Recent Labs    04/28/20 1805  LABPROT 17.2*  INR 1.5*   Radiology: [facility] Chest Port 1 View  Result Date: 04/29/2020 CLINICAL DATA:  80 year old male with a history CABG yesterday EXAM: PORTABLE CHEST 1 VIEW COMPARISON:  04/28/2020, 04/27/2020 FINDINGS: Cardiomediastinal silhouette unchanged in size and contour, accentuated by low lung volumes. Surgical changes of median sternotomy and CABG. Endotracheal tube terminates 5.4 cm above the carina. Gastric tube terminates within the left upper quadrant with the side port at the GE junction. Right IJ sheath transmits a Swan-Ganz catheter which terminates in the proximal pulmonary artery. Mediastinal/pleural drains are unchanged.  Epicardial pacing leads are not visualized. Low  lung volumes with coarsened interstitial markings. No pneumothorax. No pleural effusion. IMPRESSION: Early surgical changes of CABG, with low lung volumes and atelectasis. No visualized pneumothorax with unchanged mediastinal/pleural drains. Unchanged position of endotracheal tube, gastric tube, and right IJ sheath which transmits Swan-Ganz catheter. Electronically Signed   By: Corrie Mckusick D.O.   On: 04/29/2020 07:52   DG Chest Port 1 View  Result Date: 04/28/2020 CLINICAL DATA:  Post CABG EXAM: PORTABLE CHEST 1 VIEW COMPARISON:  04/27/2020 FINDINGS: Changes of CABG. NG tube tip is in the distal esophagus near the GE junction. Endotracheal tube is 5 cm above the carina. Bilateral chest tubes. No pneumothorax. Swan-Ganz catheter tip is in the main pulmonary artery. Bibasilar atelectasis. IMPRESSION: Postoperative changes.  Bibasilar atelectasis.  No pneumothorax. Electronically Signed   By: Rolm Baptise M.D.   On: 04/28/2020 18:20   ECHOCARDIOGRAM COMPLETE  Result Date: 04/28/2020    ECHOCARDIOGRAM REPORT   Patient Name:   KALA GASSMANN Date of Exam: 04/28/2020 Medical Rec #:  [id number]      Height:       70.0 in Accession #:    [id number]     Weight:       206.6 lb Date of Birth:  05/13/1941      BSA:          2.116 m Patient Age:    79 years       BP:           164/105 mmHg Patient Gender: M              HR:           92 bpm. Exam Location:  Inpatient Procedure: 2D Echo Indications:    I21.3 STEMI  History:        Patient has prior history of Echocardiogram examinations, most                 recent 05/07/2017. Risk Factors:Hypertension and Dyslipidemia.  Sonographer:    Jannett Celestine RDCS (AE) Referring Phys: [place]  1. No intracavitary thrombus is seen. The dyskinetic anteroapical area is not thinned or hyperechoic, suggesting recent ischemia/infarction. Left ventricular ejection fraction, by estimation, is 30 to 35%. The left  ventricle has moderate to severely decreased function. The left ventricle demonstrates regional wall motion abnormalities (see scoring diagram/findings for description). Left ventricular diastolic parameters are consistent with Grade II diastolic dysfunction (pseudonormalization). Elevated left atrial pressure.  2. Right ventricular systolic function is normal. The right ventricular size is normal. A catheter or pacemaker wire is seen in the right ventricle. is visualized.  3. The mitral valve is normal in structure. No evidence of mitral valve regurgitation.  4. The aortic valve is normal in structure. Aortic valve regurgitation is not visualized. Comparison(s): Changes from prior study are noted. The left ventricular function is significantly worse. The left ventricular wall motion abnormalities are new. FINDINGS  Left Ventricle: No intracavitary thrombus is seen. The dyskinetic anteroapical area is not thinned or hyperechoic, suggesting recent ischemia/infarction. Left ventricular ejection fraction, by estimation, is 30 to 35%. The left ventricle has moderate to  severely decreased function. The left ventricle demonstrates regional wall motion abnormalities. The left ventricular internal cavity size was normal in size. There is no left ventricular hypertrophy. Left ventricular diastolic parameters are consistent  with Grade II diastolic dysfunction (pseudonormalization). Elevated left atrial pressure.  LV Wall Scoring: The apical lateral segment, apical septal segment, apical anterior segment, and apex  are dyskinetic. The mid anteroseptal segment, mid anterior segment, and apical inferior segment are akinetic. The antero-lateral wall, inferior wall, posterior wall, basal anteroseptal segment, mid inferoseptal segment, basal anterior segment, and basal inferoseptal segment are normal. Right Ventricle: The right ventricular size is normal. No increase in right ventricular wall thickness. Right ventricular systolic  function is normal. Left Atrium: Left atrial size was normal in size. Right Atrium: Right atrial size was normal in size. Pericardium: There is no evidence of pericardial effusion. Mitral Valve: The mitral valve is normal in structure. No evidence of mitral valve regurgitation. Tricuspid Valve: The tricuspid valve is normal in structure. Tricuspid valve regurgitation is not demonstrated. Aortic Valve: The aortic valve is normal in structure. Aortic valve regurgitation is not visualized. Pulmonic Valve: The pulmonic valve was grossly normal. Pulmonic valve regurgitation is not visualized. Aorta: The aortic root is normal in size and structure. IAS/Shunts: No atrial level shunt detected by color flow Doppler. Additional Comments: A catheter or pacemaker wire is seen in the right ventricle. is visualized.  LEFT VENTRICLE PLAX 2D LVIDd:         4.40 cm  Diastology LVIDs:         3.40 cm  LV e' lateral:   11.10 cm/s LV PW:         1.20 cm  LV E/e' lateral: 8.1 LV IVS:        0.90 cm LVOT diam:     2.20 cm LV SV:         47 LV SV Index:   22 LVOT Area:     3.80 cm  LEFT ATRIUM           Index LA diam:      3.40 cm 1.61 cm/m LA Vol (A2C): 58.6 ml 27.69 ml/m  AORTIC VALVE LVOT Vmax:   69.10 cm/s LVOT Vmean:  51.200 cm/s LVOT VTI:    0.124 m  AORTA Ao Root diam: 3.10 cm MITRAL VALVE MV Area (PHT): 6.27 cm    SHUNTS MV Decel Time: 121 msec    Systemic VTI:  0.12 m MV E velocity: 89.70 cm/s  Systemic Diam: 2.20 cm MV A velocity: 68.90 cm/s MV E/A ratio:  1.30 Mihai Croitoru MD Electronically signed by Thurmon Fair MD Signature Date/Time: 04/28/2020/10:53:22 AM    Final    ECHO INTRAOPERATIVE TEE  Result Date: 04/28/2020  *INTRAOPERATIVE TRANSESOPHAGEAL REPORT *  Patient Name:   DION SIBAL Date of Exam: 04/28/2020 Medical Rec #:  [id number]      Height:       70.0 in Accession #:    [id number]     Weight:       206.6 lb Date of Birth:  1941-10-31      BSA:          2.12 m Patient Age:    79 years       BP:            165/105 mmHg Patient Gender: M              HR:           75 bpm. Exam Location:  Anesthesiology Transesophogeal exam was perform intraoperatively during surgical procedure. Patient was closely monitored under general anesthesia during the entirety of examination. Indications:     Coronary artery disease Sonographer:     Renella Cunas RDCS Performing Phys: [id number] Eliezer Lofts Trini Christiansen Diagnosing Phys: Lewie Loron MD Complications: No known complications during this procedure. POST-OP  IMPRESSIONS - Right Ventricle: The right ventricle appears unchanged from pre-bypass. - Aorta: The aorta appears unchanged from pre-bypass. - Left Atrium: The left atrium appears unchanged from pre-bypass. - Left Atrial Appendage: The left atrial appendage appears unchanged from pre-bypass. - Aortic Valve: The aortic valve appears unchanged from pre-bypass. - Mitral Valve: The mitral valve appears unchanged from pre-bypass. - Tricuspid Valve: The tricuspid valve appears unchanged from pre-bypass. - Interatrial Septum: The interatrial septum appears unchanged from pre-bypass. - Interventricular Septum: The interventricular septum appears unchanged from pre-bypass. - Pericardium: The pericardium appears unchanged from pre-bypass. PRE-OP FINDINGS  Left Ventricle: The left ventricle has mildly reduced systolic function, with an ejection fraction of 45-50%. The cavity size was moderately dilated. There is moderate concentric left ventricular hypertrophy. Right Ventricle: The right ventricle has normal systolic function. The cavity was mildly enlarged. There is no increase in right ventricular wall thickness. Left Atrium: Left atrial size was normal in size. The left atrial appendage is well visualized and there is no evidence of thrombus present. Right Atrium: Right atrial size was normal in size. Interatrial Septum: No atrial level shunt detected by color flow Doppler. Pericardium: There is no evidence of pericardial effusion. There is a  small pleural effusion in the right lateral region. Mitral Valve: The mitral valve is normal in structure. Mild thickening of the mitral valve leaflet. Mitral valve regurgitation is mild by color flow Doppler. There is no evidence of mitral valve vegetation. Tricuspid Valve: The tricuspid valve was normal in structure. Tricuspid valve regurgitation is mild by color flow Doppler. There is no evidence of tricuspid valve vegetation. Aortic Valve: The aortic valve is tricuspid There is mild thickening of the aortic valve Aortic valve regurgitation is trivial by color flow Doppler. There is no evidence of aortic valve stenosis. There is no evidence of a vegetation on the aortic valve. Pulmonic Valve: The pulmonic valve was normal in structure. Pulmonic valve regurgitation is trivial by color flow Doppler. 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 +-------------+--------++ AORTIC VALVE          +-------------+--------++ AV Mean Grad:3.0 mmHg +-------------+--------++  Lewie LoronJohn Germeroth MD Electronically signed by Lewie LoronJohn Germeroth MD Signature Date/Time: 04/28/2020/4:53:34 PM    Final      Assessment/Plan: S/P Procedure(s) (LRB): CORONARY ARTERY BYPASS GRAFTING (CABG) times four using LIMA to LAD; bilateral endoscopic greater saphenous vein harvest: SVG to Circ; SVG to RAMUS; and SVG to PDA. (N/A) Endovein Harvest Of Greater Saphenous Vein (Bilateral)  1 doing well POD#1 2 hoping to extubate soon, minor hypoxemia o/w gas looks good, checking NIF/Parameters 2 BP a little low, wean dobut and then levo off as able 3 excellent UOP, BUN/Creat 16/1.34- cont spont diuresis for now, may need some ;asix 4 mod CT drainage 620 cc- keep tubes for now 5 ABL anemia- not in transfusion threshold, monitor , minor thrombocytopenia- monitor, minor leukocytosis- inflamm response- monitir 6 CXR minor atx, no p edema or infilts 7 BS adeq control, no DM hx  or meds 8 routine progression  Rowe ClackWayne E Gold PA-C Pager [phone] 04/29/2020 8:23 AM  Agree with above Doing well.  Extubated this morning We will remove Swan and A-line.  We will wean Levophed and dobutamine Pulmonary toilet and ambulation today.  Divante Kotch Keane Scrape Aronda Burford

## 2020-04-29 NOTE — Progress Notes (Signed)
Patient ID: Joshua Mack, male   DOB: [birth year], 79 y.o.   MRN: [id number] EVENING ROUNDS NOTE :     [address]       [place] [address]             [phone]                 1 Day Post-Op Procedure(s) (LRB): CORONARY ARTERY BYPASS GRAFTING (CABG) times four using LIMA to LAD; bilateral endoscopic greater saphenous vein harvest: SVG to Circ; SVG to RAMUS; and SVG to PDA. (N/A) Endovein Harvest Of Greater Saphenous Vein (Bilateral)  Total Length of Stay:  LOS: 2 days  BP 100/74   Pulse 94   Temp 98.9 F (37.2 C) (Oral)   Resp (!) 27   Ht 5\' 10"  (1.778 m)   Wt 98 kg   SpO2 91%   BMI 31.00 kg/m   .Intake/Output      05/11 0701 - 05/12 0700 05/12 0701 - 05/13 0700   I.V. (mL/kg) 3888.2 (39.7) 242.4 (2.5)   Blood 470    IV Piggyback 1720.9 200   Total Intake(mL/kg) 6079.1 (62) 442.4 (4.5)   Urine (mL/kg/hr) 2408 (1) 150 (0.1)   Blood 800    Chest Tube 620 0   Total Output 3828 150   Net +2251.1 +292.4          . DOBUTamine 2.5 mcg/kg/min (04/29/20 1600)  . lactated ringers 20 mL/hr at 04/29/20 1600  . niCARDipine    . norepinephrine (LEVOPHED) Adult infusion Stopped (04/29/20 0923)  . potassium chloride       Lab Results  Component Value Date   WBC 12.2 (H) 04/29/2020   HGB 8.2 (L) 04/29/2020   HCT 25.4 (L) 04/29/2020   PLT 126 (L) 04/29/2020   GLUCOSE 138 (H) 04/29/2020   CHOL 280 (H) 04/27/2020   TRIG 149 04/27/2020   HDL 57 04/27/2020   LDLCALC NOT CALCULATED 04/27/2020   ALT 30 04/27/2020   AST 53 (H) 04/27/2020   NA 137 04/29/2020   K 3.7 04/29/2020   CL 107 04/29/2020   CREATININE 1.34 (H) 04/29/2020   BUN 16 04/29/2020   CO2 23 04/29/2020   INR 1.5 (H) 04/28/2020   HGBA1C 5.7 (H) 04/27/2020   Up in chair, stable day  levophed off on dobutamine     06/27/2020 MD  Beeper [phone] Office [phone] 04/29/2020 5:49 PM

## 2020-04-29 NOTE — Procedures (Signed)
Extubation Procedure Note  Patient Details:   Name: Joshua Mack DOB: 06-May-1941 MRN: [id number]   Airway Documentation:    Vent end date: 04/29/20 Vent end time: 0924   Evaluation  O2 sats: stable throughout Complications: No apparent complications Patient did tolerate procedure well. Bilateral Breath Sounds: Clear, Diminished   Pt extubated to 5L [place] per rapid wean protocol. NIF was -28 and VC was 900. Cuff leak was positive. Pt able to voice.  Guss Bunde 04/29/2020, 9:25 AM

## 2020-04-29 NOTE — Progress Notes (Signed)
After trying Cpap/PS which patient tolerated, vent was placed  back on SIMV/Prvc mode with good tolerance by patient. Still not able to turn fio2 below 60%

## 2020-04-29 NOTE — Progress Notes (Signed)
Unable to pull back right radial arterial sheath.

## 2020-04-29 NOTE — Progress Notes (Signed)
Pt set up on auto titrate CPAP tole well.

## 2020-04-29 NOTE — Discharge Summary (Signed)
Physician Discharge Summary  Patient ID: Joshua Mack MRN: [id number] DOB/AGE: [birth year] 80 y.o.  Admit date: 04/27/2020 Discharge date: 05/03/2020  Admission Diagnoses: Acute STEMI  Discharge Diagnoses:  Principal Problem:   Acute ST elevation myocardial infarction (STEMI) of anterolateral wall (HCC)-likely delayed presentation Active Problems:   Spinal stenosis at L4-L5 level   Respiratory failure with hypoxia (HCC)   OSA (obstructive sleep apnea)   Essential hypertension   Multiple vessel coronary artery disease: Acute mid LAD 100% occlusion, 90% RI and CTO of LCx and RCA   Acute combined systolic and diastolic heart failure (HCC)   S/P CABG x 4   Patient Active Problem List   Diagnosis Date Noted  . Multiple vessel coronary artery disease: Acute mid LAD 100% occlusion, 90% RI and CTO of LCx and RCA 04/28/2020  . Acute combined systolic and diastolic heart failure (HCC) 04/28/2020  . 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 Acute ST elevation myocardial infarction (STEMI) of anterolateral wall (HCC)-likely delayed presentation 04/27/2020  . Essential hypertension 04/27/2020  . DVT (deep venous thrombosis) (HCC) 05/08/2017  . OSA (obstructive sleep apnea) 05/08/2017  . Hyponatremia 05/07/2017  . Respiratory failure with hypoxia (HCC) 05/07/2017  . Spinal stenosis at L4-L5 level 05/03/2017     History of Present Illness:    at time of evaluation by surgeon 79 yo male admitted transferred from [facility] with an STEMI.  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  A balloon pump was placed, and he was then transferred to the ICU.  He has been chest pain free since the [employer]  Discharged Condition: good  Hospital Course: Following cardiac catheterization the patient was seen in cardiothoracic surgical consultation by Dr. Cliffton Asters.  After evaluating the patient and his studies he recommended proceeding with CABG as his best surgical revascularization  option due to the severity of his disease and anatomical findings.  The procedure was scheduled and on 04/28/2020 he was taken the operating room where he underwent the below described procedure.  He tolerated it well and was taken to the surgical intensive care unit in stable condition.  Postoperative hospital course:  Overall patient has made very steady overall progress.  He was extubated using standard protocols on postoperative day #1.  He has had some postoperative hypoxia and did require high flow nasal cannula for a short-term.  It has been weaned over time and he maintains good oxygen saturations.  He initially did require some inotropic support with dobutamine and Levophed but these were able to be weaned without significant difficulty.  He does have some postoperative volume overload but is responding well to Lasix.  His weight is slowly returning to its baseline and volume status/urine output/renal function are stable.  All routine lines, monitors and drainage devices have been discontinued in the standard fashion.  He does have an expected acute blood loss anemia which is stabilized.  Most recent hemoglobin and hematocrit dated 5/15 are 8.2/25.2.  His blood sugars have been under good control and he does not have a history of diabetes.  Incisions are all noted to be healing well without evidence of infection.  He is tolerating gradually increasing activities using a standard cardiac rehabilitation phase 1 modalities.  He did have a mild postoperative ileus but bowel function has resumed over time.  He has maintained normal sinus rhythm without any significant cardiac dysrhythmias.  At the time of discharge the patient is felt  to be quite stable.  Consults: cardiology  Significant Diagnostic Studies: angiography: Cardiac catheterization and echocardiogram.  Also routine postoperative serial chest x-rays and labs.   Prox RCA lesion is 100% stenosed.  Prox Cx to Mid Cx lesion is 100%  stenosed.  Ramus lesion is 90% stenosed.  Mid LAD lesion is 100% stenosed.  LV end diastolic pressure is moderately elevated.   1. 3 vessel occlusive CAD.    - 100% mid LAD after a large diagonal branch. Faint right to left collaterals.    - 90% ramus intermediate    - 100% CTO of the mid LCx with good left to left collaterals    - 100% CTO of the proximal RCA. Good right to right and left to right collaterals. 2. Difficult to assess LV function due to underfilling. Will assess with Echo 3. Moderately elevated LV filling pressures. Improved with IV lasix and IABP 4. Normal right heart pressures 5. Preserved cardiac output 4.4 liters/min  Plan: transfer to ICU. Heparinize. IABP support. CT surgery consultation for CABG. Assess LV function with Echo. Continue diuresis. Trend troponin levels.    Recommendations  Antiplatelet/Anticoag Recommend Aspirin 81mg  daily for moderate CAD.  Surgeon Notes    04/29/2020 10:44 AM Op Note signed by 06/29/2020, MD    04/28/2020 5:43 PM Operative Note - Scan signed by Default, Provider, MD  Indications  ST elevation myocardial infarction involving left anterior descending (LAD) coronary artery (HCC) [I21.02 (ICD-10-CM)]  Procedural Details  Technical Details Indication: 79 yo WM with history of HTN, HLD and OSA presented to [facility] with complaints of chest and arm pain and congestion. Ecg showed ST elevation anteriorly with Q waves. Code STEMI activated. On arrival to our facility he was completely pain free but noted continued chest congestion and cough. Troponin was elevated at 1600. CXR showed pulmonary edema. Emergent cardiac cath was recommended  Procedural Details: The right wrist was prepped, draped, and anesthetized with 1% lidocaine. Ultrasound was used to guide access. Image was obtained and stored in the record.  Using the modified Seldinger technique, a 6 French slender sheath was introduced into the right radial  artery. 3 mg of verapamil was administered through the sheath, weight-based unfractionated heparin was administered intravenously. Standard Judkins catheters were used for selective coronary angiography and left ventriculography. Catheter exchanges were performed over an exchange length guidewire. Following diagnostic angiogram I reviewed patient's situation with Dr [facility] with CT surgery. I suspect the culprit vessel was the LAD but he also had severe disease in the ramus intermediate and CTO of the mid LCx and proximal RCA with good collaterals. He had faint collaterals to the LAD. Hand injection of the LV suggested that LV function was not terrible.  I felt his best option was to have CABG. We next placed a 8 Fr sheath in the right femoral vein under Cliffton Asters guidance and a right heart cath was performed. Arterial access was also obtained in the right femoral artery and a Balloon pump was inserted in the right femoral artery for augmentation. With placement of the IABP and IV lasix his oxygen saturations improved and his tachycardia improved. BP remained stable throughout. There were no immediate procedural complications.  The patient was transferred to the post catheterization recovery area for further monitoring.  Contrast: 85 cc  Estimated blood loss <50 mL.   During this procedure no sedation was administered.  Medications (Filter: Administrations occurring from 04/27/20 2147 to 04/27/20 2325) (important)  Continuous medications  are totaled by the amount administered until 04/27/20 2325.  lidocaine (PF) (XYLOCAINE) 1 % injection (mL) Total volume:  22 mL  Date/Time  Rate/Dose/Volume Action  04/27/20 2201  2 mL Given  2239  20 mL Given    Heparin (Porcine) in NaCl 1000-0.9 UT/500ML-% SOLN (mL) Total volume:  1,000 mL  Date/Time  Rate/Dose/Volume Action  04/27/20 2202  500 mL Given  2202  500 mL Given    Radial Cocktail/Verapamil only (mL) Total volume:  10 mL  Date/Time   Rate/Dose/Volume Action  04/27/20 2204  10 mL Given    0.9 % sodium chloride infusion (mL/hr) Total dose:  Cannot be calculated* Dosing weight:  95  *Continuous medication not stopped within the calculation time range. Date/Time  Rate/Dose/Volume Action  04/27/20 2155  10 mL/hr New Bag/Given    furosemide (LASIX) injection (mg) Total dose:  80 mg  Date/Time  Rate/Dose/Volume Action  04/27/20 2210  20 mg Given  2220  60 mg Given    iohexol (OMNIPAQUE) 350 MG/ML injection (mL) Total volume:  65 mL  Date/Time  Rate/Dose/Volume Action  04/27/20 2310  65 mL Given    0.9 % sodium chloride infusion (mL/hr) Total dose:  Cannot be calculated* Dosing weight:  95  *Continuous medication not stopped within the calculation time range. Date/Time  Rate/Dose/Volume Action  04/27/20 2314  10 mL/hr New Bag/Given    0.9 % sodium chloride infusion (mL/hr) Total dose:  Cannot be calculated* Dosing weight:  95  *Continuous medication not stopped within the calculation time range. Date/Time  Rate/Dose/Volume Action  04/27/20 2315  10 mL/hr New Bag/Given    0.9 % sodium chloride infusion (mL/hr) Total dose:  Cannot be calculated* Dosing weight:  95  *Continuous medication not stopped within the calculation time range. Date/Time  Rate/Dose/Volume Action  04/27/20 2322  3 mL/hr New Bag/Given    0.9 % sodium chloride infusion (mL/hr) Total dose:  Cannot be calculated* Dosing weight:  95  *Continuous medication not stopped within the calculation time range. Date/Time  Rate/Dose/Volume Action  04/27/20 2322  3 mL/hr New Bag/Given    Contrast  Medication Name Total Dose  iohexol (OMNIPAQUE) 350 MG/ML injection 65 mL    Radiation/Fluoro  Fluoro time: 7.6 (min) DAP: 57846 (mGycm2) Cumulative Air Kerma: 422 (mGy)  Complications  Complications documented before study signed (04/27/2020 11:28 PM)   No complications were associated with this study.  Documented by [place], Peter M, MD  - 04/27/2020 11:22 PM    Coronary Findings  Diagnostic Dominance: Right Left Anterior Descending  Mid LAD lesion 100% stenosed  Mid LAD lesion is 100% stenosed.  Second Septal Branch  Collaterals  2nd Sept filled by collaterals from Acute Mrg.    Ramus Intermedius  Ramus lesion 90% stenosed  Ramus lesion is 90% stenosed.  Left Circumflex  Prox Cx to Mid Cx lesion 100% stenosed  Prox Cx to Mid Cx lesion is 100% stenosed. The lesion is chronically occluded.  Third Obtuse Marginal Branch  Collaterals  3rd Mrg filled by collaterals from Ramus.    Right Coronary Artery  Prox RCA lesion 100% stenosed  Prox RCA lesion is 100% stenosed. The lesion is chronically occluded with left-to-right and right-to-right collateral flow.  Right Ventricular Branch  Collaterals  RV Branch filled by collaterals from Prox RCA.    Third Right Posterolateral Branch  Collaterals  3rd RPL filled by collaterals from Ramus.    Intervention  No interventions have been documented. Impella/IABP  Hemodynamic Support  An IABP was inserted for hemodynamic support in the setting of cardiogenic shock. Access site:  right femoral artery.  Left Heart  Left Ventricle LV end diastolic pressure is moderately elevated. The ejection fraction could not be assessed due to underfilling.  Coronary Diagrams  Diagnostic Dominance: Right    Patient name: Joshua Mack  MRN: [id number]  Age: 79 y.o.  Sex: male  MyChart Results Release  MyChart Status: Active Results Release  Vitals  BP Height Weight BSA (Calculated - sq m)  109/87  (1.778 m) 96.6 kg 2.17 sq meters  Order-Level Documents:  Scan on 04/28/2020 10:54 AM by Default, Provider, MD     Study Result    ECHOCARDIOGRAM REPORT       Patient Name:  Joshua Mack Date of Exam: 04/28/2020  Medical Rec #: [id number]   Height:    70.0 in  Accession #:  [id number]   Weight:    206.6 lb  Date of Birth: [birth year]   BSA:      2.116 m  Patient Age:  79 years    BP:      164/105 mmHg  Patient Gender: M       HR:      92 bpm.  Exam Location: Inpatient   Procedure: 2D Echo   Indications:  I21.3 STEMI    History:    Patient has prior history of Echocardiogram examinations,  most         recent 05/07/2017. Risk Factors:Hypertension and  Dyslipidemia.    Sonographer:  Celene Skeen RDCS (AE)  Referring Phys: [phone] AMANDA C CONIGLIO   IMPRESSIONS    1. No intracavitary thrombus is seen. The dyskinetic anteroapical area is  not thinned or hyperechoic, suggesting recent ischemia/infarction. Left  ventricular ejection fraction, by estimation, is 30 to 35%. The left  ventricle has moderate to severely  decreased function. The left ventricle demonstrates regional wall motion  abnormalities (see scoring diagram/findings for description). Left  ventricular diastolic parameters are consistent with Grade II diastolic  dysfunction (pseudonormalization).  Elevated left atrial pressure.  2. Right ventricular systolic function is normal. The right ventricular  size is normal. A catheter or pacemaker wire is seen in the right  ventricle. is visualized.  3. The mitral valve is normal in structure. No evidence of mitral valve  regurgitation.  4. The aortic valve is normal in structure. Aortic valve regurgitation is  not visualized.   Comparison(s): Changes from prior study are noted. The left ventricular  function is significantly worse. The left ventricular wall motion  abnormalities are new.   FINDINGS  Left Ventricle: No intracavitary thrombus is seen. The dyskinetic  anteroapical area is not thinned or hyperechoic, suggesting recent  ischemia/infarction. Left ventricular ejection fraction, by estimation, is  30 to 35%. The left ventricle has moderate to  severely decreased function. The left ventricle demonstrates regional  wall motion abnormalities. The  left ventricular internal cavity size was  normal in size. There is no left ventricular hypertrophy. Left ventricular  diastolic parameters are consistent  with Grade II diastolic dysfunction (pseudonormalization). Elevated left  atrial pressure.     LV Wall Scoring:  The apical lateral segment, apical septal segment, apical anterior  segment,  and apex are dyskinetic. The mid anteroseptal segment, mid anterior  segment,  and apical inferior segment are akinetic. The antero-lateral wall,  inferior  wall, posterior wall, basal anteroseptal segment, mid inferoseptal  segment,  basal anterior segment, and basal inferoseptal segment are  normal.   Right Ventricle: The right ventricular size is normal. No increase in  right ventricular wall thickness. Right ventricular systolic function is  normal.   Left Atrium: Left atrial size was normal in size.   Right Atrium: Right atrial size was normal in size.   Pericardium: There is no evidence of pericardial effusion.   Mitral Valve: The mitral valve is normal in structure. No evidence of  mitral valve regurgitation.   Tricuspid Valve: The tricuspid valve is normal in structure. Tricuspid  valve regurgitation is not demonstrated.   Aortic Valve: The aortic valve is normal in structure. Aortic valve  regurgitation is not visualized.   Pulmonic Valve: The pulmonic valve was grossly normal. Pulmonic valve  regurgitation is not visualized.   Aorta: The aortic root is normal in size and structure.   IAS/Shunts: No atrial level shunt detected by color flow Doppler.   Additional Comments: A catheter or pacemaker wire is seen in the right  ventricle. is visualized.     LEFT VENTRICLE  PLAX 2D  LVIDd:     4.40 cm Diastology  LVIDs:     3.40 cm LV e' lateral:  11.10 cm/s  LV PW:     1.20 cm LV E/e' lateral: 8.1  LV IVS:    0.90 cm  LVOT diam:   2.20 cm  LV SV:     47  LV SV Index:  22  LVOT Area:    3.80 cm     LEFT ATRIUM      Index  LA diam:   3.40 cm 1.61 cm/m  LA Vol (A2C): 58.6 ml 27.69 ml/m  AORTIC VALVE  LVOT Vmax:  69.10 cm/s  LVOT Vmean: 51.200 cm/s  LVOT VTI:  0.124 m    AORTA  Ao Root diam: 3.10 cm   MITRAL VALVE  MV Area (PHT): 6.27 cm  SHUNTS  MV Decel Time: 121 msec  Systemic VTI: 0.12 m  MV E velocity: 89.70 cm/s Systemic Diam: 2.20 cm  MV A velocity: 68.90 cm/s  MV E/A ratio: 1.30   Mihai Croitoru MD  Electronically signed by Thurmon FairMihai Croitoru MD  Signature Date/Time: 04/28/2020/10:53:22 AM      Final     Treatments: surgery:   04/28/2020 Patient:  Joshua Mack Pre-Op Dx: STEMI                         3V CAD   Post-op Dx:  same Procedure: CABG X 4.  LIMA-LAD, RSVG PDA, D1 and OM3    Endoscopic greater saphenous vein harvest on the right and left thighs Intra-operative Transesophageal Echocardiogram Removal of Intra-aortic balloon pump   Surgeon and Role:      * Lightfoot, Eliezer LoftsHarrell O, MD - Primary    * T. Asa Lenteonte, PA-C - assisting   Anesthesia  general Discharge Exam: Blood pressure 125/74, pulse 98, temperature 99.5 F (37.5 C), temperature source Oral, resp. rate 18, height 5\' 10"  (1.778 m), weight 94.2 kg, SpO2 91 %.  General appearance: alert, cooperative and no distress Heart: regular rate and rhythm and tachy Lungs: clear to auscultation bilaterally Abdomen: soft, non-tender; bowel sounds normal; no masses,  no organomegaly Extremities: edema trace, improved Wound: clean and dry   Discharge disposition: 01-Home or Self Care   Discharge Medications:    Discharge Instructions    Amb Referral to Cardiac Rehabilitation   Complete by: As directed    Diagnosis:  CABG STEMI  CABG X ___: 4   After initial evaluation and assessments completed: Virtual Based Care may be provided alone or in conjunction with Phase 2 Cardiac Rehab based on patient barriers.: Yes     Allergies as of 05/03/2020       Reactions   Fentanyl Other (See Comments)   Had a reaction during a colonoscopy in the past. He was told it required Benadryl.   Midazolam Other (See Comments)   Had a reaction during a colonoscopy in the past. He was told it required Benadryl.   Amoxicillin Rash   Has patient had a PCN reaction causing immediate rash, facial/tongue/throat swelling, SOB or lightheadedness with hypotension: No Has patient had a PCN reaction causing severe rash involving mucus membranes or skin necrosis: No Has patient had a PCN reaction that required hospitalization: No Has patient had a PCN reaction occurring within the last 10 years: No If all of the above answers are "NO", then may proceed with Cephalosporin use.   Other Itching, Rash, Other (See Comments)   Strong, perfume-y detergents    Tavist Nd [loratadine] Rash      Medication List    STOP taking these medications   amLODipine 10 MG tablet Commonly known as: NORVASC   ibuprofen 200 MG tablet Commonly known as: ADVIL   naproxen sodium 220 MG tablet Commonly known as: ALEVE     TAKE these medications   acetaminophen 500 MG tablet Commonly known as: TYLENOL Take 500-1,000 mg by mouth 2 (two) times daily as needed for mild pain or headache.   aspirin 81 MG EC tablet Take 1 tablet (81 mg total) by mouth daily. What changed:   medication strength  how much to take  when to take this  reasons to take this   atorvastatin 40 MG tablet Commonly known as: LIPITOR Take 1 tablet (40 mg total) by mouth daily.   Centrum Silver 50+Men Tabs Take 1 tablet by mouth daily with breakfast.   Chromium 1000 MCG Tabs Take 1,000 mcg by mouth daily.   clopidogrel 75 MG tablet Commonly known as: PLAVIX Take 1 tablet (75 mg total) by mouth daily.   dextromethorphan-guaiFENesin 30-600 MG 12hr tablet Commonly known as: MUCINEX DM Take 1 tablet by mouth 2 (two) times daily as needed for cough.   docusate sodium 100 MG capsule Commonly known  as: Colace Take 1 capsule (100 mg total) by mouth 2 (two) times daily as needed for mild constipation.   ESTER-C PO Take 500 mg by mouth daily.   Garlic 9030 MG Caps Take 2,000 mg by mouth daily.   Green Tea 250 MG Caps Take 500 mg by mouth at bedtime.   Krill Oil 1000 MG Caps Take 1,000 mg by mouth daily.   L-Arginine 500 MG Caps Take 500 mg by mouth daily.   melatonin 5 MG Tabs Take 5 mg by mouth at bedtime.   metoprolol tartrate 25 MG tablet Commonly known as: LOPRESSOR Take 1 tablet (25 mg total) by mouth 2 (two) times daily.   Osteo Bi-Flex Joint Shield Tabs Take 1 tablet by mouth daily.   Urinozinc Prostate Caps Take 1 capsule by mouth daily.   polyethylene glycol 17 g packet Commonly known as: MIRALAX / GLYCOLAX Take 17 g by mouth daily.   PRESCRIPTION MEDICATION CPAP- At bedtime and during times of rest   Resveratrol 250 MG Caps Take 250 mg by mouth daily.   traMADol 50 MG tablet Commonly known as: ULTRAM Take 1 tablet (50  mg total) by mouth every 4 (four) hours as needed for moderate pain.   Turmeric Curcumin 500 MG Caps Take 1,000 mg by mouth daily.      Follow-up Information    Coniglio, Harlen Labs, MD Follow up.   Specialty: Internal Medicine Why: Please see discharge paperwork for 2-week follow-up with cardiology. Contact information: [address] [place] [address] [phone]        Corliss Skains, MD Follow up.   Specialty: Cardiothoracic Surgery Why: You have an appointment to see Dr. Cliffton Asters on 05/07/2020 at 10:15 AM.  Please obtain a chest x-ray at 9:45 AM at [facility] imaging located in the same office complex on the first floor. Contact information: [address] [place] [address] [phone]          The patient has been discharged on:   1.Beta Blocker:  Yes [ X  ]                              No   [   ]                              If No, reason:  2.Ace Inhibitor/ARB: Yes [   ]                                      No  [  X  ]                                     If No, reason: labile BP  3.Statin:   Yes [ X  ]                  No  [   ]                  If No, reason:  4.Ecasa:  Yes  [ X  ]                  No   [   ]                  If No, reason:  Signed:  Original by Gershon Crane PA- C  Update by:  Lowella Dandy, PA-C  05/03/2020, 6:24 AM

## 2020-04-29 NOTE — Progress Notes (Signed)
Patient is very desynchronous with ventilator. Alarms are going off every minute with border line saturations. RT tried several adjustment without success. Patient is  Awake on 0.7 of precedex and able to follow command and communicate needs.

## 2020-04-29 NOTE — Discharge Instructions (Signed)
[facility] office [phone]   Endoscopic Saphenous Vein Harvesting, Care After This sheet gives you information about how to care for yourself after your procedure. Your health care provider may also give you more specific instructions. If you have problems or questions, contact your health care provider. What can I expect after the procedure? After the procedure, it is common to have:  Pain.  Bruising.  Swelling.  Numbness. Follow these instructions at home: Incision care   Follow instructions from your health care provider about how to take care of your incisions. Make sure you: ? Wash your hands with soap and water before and after you change your bandages (dressings). If soap and water are not available, use hand sanitizer. ? Change your dressings as told by your health care provider. ? Leave stitches (sutures), skin glue, or adhesive strips in place. These skin closures may need to stay in place for 2 weeks or longer. If adhesive strip edges start to loosen and curl up, you may trim the loose edges. Do not remove adhesive strips completely unless your health care provider tells you to do that.  Check your incision areas every day for signs of infection. Check for: ? More redness, swelling, or pain. ? Fluid or blood. ? Warmth. ? Pus or a bad smell. Medicines  Take over-the-counter and prescription medicines only as told by your health care provider.  Ask your health care provider if the medicine prescribed to you requires you to avoid driving or using heavy machinery. General instructions  Raise (elevate) your legs above the level of your heart while you are sitting or lying down.  Avoid crossing your legs.  Avoid sitting for long periods of time. Change positions every 30 minutes.  Do any exercises your health care providers have given you. These may include deep breathing, coughing, and walking exercises.  Do not take baths, swim, or use a hot tub until your health care  provider approves. Ask your health care provider if you may take showers. You may only be allowed to take sponge baths.  Wear compression stockings as told by your health care provider. These stockings help to prevent blood clots and reduce swelling in your legs.  Keep all follow-up visits as told by your health care provider. This is important. Contact a health care provider if:  Medicine does not help your pain.  Your pain gets worse.  You have new leg bruises or your leg bruises get bigger.  Your leg feels numb.  You have more redness, swelling, or pain around your incision.  You have fluid or blood coming from your incision.  Your incision feels warm to the touch.  You have pus or a bad smell coming from your incision.  You have a fever. Get help right away if:  Your pain is severe.  You develop pain, tenderness, warmth, redness, or swelling in any part of your leg.  You have chest pain.  You have trouble breathing. Summary  Raise (elevate) your legs above the level of your heart while you are sitting or lying down.  Wear compression stockings as told by your health care provider.  Make sure you know which symptoms should prompt you to contact your health care provider.  Keep all follow-up visits as told by your health care provider. This information is not intended to replace advice given to you by your health care provider. Make sure you discuss any questions you have with your health care provider. Document Revised: 11/12/2018 Document   Reviewed: 11/12/2018 [employer] Patient Education  2020 [employer] Coronary Artery Bypass Grafting, Care After This sheet gives you information about how to care for yourself after your procedure. Your doctor may also give you more specific instructions. If you have problems or questions, call your doctor. What can I expect after the procedure? After the procedure, it is common to:  Feel sick to your stomach (nauseous).  Not  want to eat as much as normal (lack of appetite).  Have trouble pooping (constipation).  Have weakness and tiredness (fatigue).  Feel sad (depressed) or grouchy (irritable).  Have pain or discomfort around the cuts from surgery (incisions). Follow these instructions at home: Medicines  Take over-the-counter and prescription medicines only as told by your doctor. Do not stop taking medicines or start any new medicines unless your doctor says it is okay.  If you were prescribed an antibiotic medicine, take it as told by your doctor. Do not stop taking the antibiotic even if you start to feel better. Incision care   Follow instructions from your doctor about how to take care of your cuts from surgery. Make sure you: ? Wash your hands with soap and water before and after you change your bandage (dressing). If you cannot use soap and water, use hand sanitizer. ? Change your bandage as told by your doctor. ? Leave stitches (sutures), skin glue, or skin tape (adhesive) strips in place. They may need to stay in place for 2 weeks or longer. If tape strips get loose and curl up, you may trim the loose edges. Do not remove tape strips completely unless your doctor says it is okay.  Make sure the surgery cuts are clean, dry, and protected.  Check your cut areas every day for signs of infection. Check for: ? More redness, swelling, or pain. ? More fluid or blood. ? Warmth. ? Pus or a bad smell.  If cuts were made in your legs: ? Avoid crossing your legs. ? Avoid sitting for long periods of time. Change positions every 30 minutes. ? Raise (elevate) your legs when you are sitting. Bathing  Do not take baths, swim, or use a hot tub until your doctor says it is okay.  You may shower . Pat the surgery cuts dry. Do not rub the cuts to dry. Eating and drinking   Eat foods that are high in fiber, such as beans, nuts, whole grains, and raw fruits and vegetables. Any meats you eat should be lean  cut. Avoid canned, processed, and fried foods. This can help prevent trouble pooping. This is also a part of a heart-healthy diet.  Drink enough fluid to keep your pee (urine) pale yellow.  Do not drink alcohol until you are fully recovered. Ask your doctor when it is safe to drink alcohol. Activity  Rest and limit your activity as told by your doctor. You may be told to: ? Stop any activity right away if you have chest pain, shortness of breath, irregular heartbeats, or dizziness. Get help right away if you have any of these symptoms. ? Move around often for short periods or take short walks as told by your doctor. Slowly increase your activities. ? Avoid lifting, pushing, or pulling anything that is heavier than 10 lb (4.5 kg) for at least 6 weeks or as told by your doctor.  Do physical therapy or a cardiac rehab (cardiac rehabilitation) program as told by your doctor. ? Physical therapy involves doing exercises to maintain movement and build strength   and endurance. ? A cardiac rehab program includes:  Exercise training.  Education.  Counseling.  Do not drive until your doctor says it is okay.  Ask your doctor when you can go back to work.  Ask your doctor when you can be sexually active. General instructions  Do not drive or use heavy machinery while taking prescription pain medicine.  Do not use any products that contain nicotine or tobacco. These include cigarettes, e-cigarettes, and chewing tobacco. If you need help quitting, ask your doctor.  Take 2-3 deep breaths every few hours during the day while you get better. This helps expand your lungs and prevent problems.  If you were given a device called an incentive spirometer, use it several times a day to practice deep breathing. Support your chest with a pillow or your arms when you take deep breaths or cough.  Wear compression stockings as told by your doctor.  Weigh yourself every day. This helps to see if your body is  holding (retaining) fluid that may make your heart and lungs work harder.  Keep all follow-up visits as told by your doctor. This is important. Contact a doctor if:  You have more redness, swelling, or pain around any cut.  You have more fluid or blood coming from any cut.  Any cut feels warm to the touch.  You have pus or a bad smell coming from any cut.  You have a fever.  You have swelling in your ankles or legs.  You have pain in your legs.  You gain 2 lb (0.9 kg) or more a day.  You feel sick to your stomach or you throw up (vomit).  You have watery poop (diarrhea). Get help right away if:  You have chest pain that goes to your jaw or arms.  You are short of breath.  You have a fast or irregular heartbeat.  You notice a "clicking" in your breastbone (sternum) when you move.  You have any signs of a stroke. "BE FAST" is an easy way to remember the main warning signs: ? B - Balance. Signs are dizziness, sudden trouble walking, or loss of balance. ? E - Eyes. Signs are trouble seeing or a change in how you see. ? F - Face. Signs are sudden weakness or loss of feeling of the face, or the face or eyelid drooping on one side. ? A - Arms. Signs are weakness or loss of feeling in an arm. This happens suddenly and usually on one side of the body. ? S - Speech. Signs are sudden trouble speaking, slurred speech, or trouble understanding what people say. ? T - Time. Time to call emergency services. Write down what time symptoms started.  You have other signs of a stroke, such as: ? A sudden, very bad headache with no known cause. ? Feeling sick to your stomach. ? Throwing up. ? Jerky movements you cannot control (seizure). These symptoms may be an emergency. Do not wait to see if the symptoms will go away. Get medical help right away. Call your local emergency services (911 in the [place] Do not drive yourself to the hospital. Summary  After the procedure, it is common to  have pain or discomfort in the cuts from surgery (incisions).  Do not take baths, swim, or use a hot tub until your doctor says it is okay.  Slowly increase your activities. You may need physical therapy or cardiac rehab.  Weigh yourself every day. This helps to see if   your body is holding fluid. This information is not intended to replace advice given to you by your health care provider. Make sure you discuss any questions you have with your health care provider. Document Revised: 08/14/2018 Document Reviewed: 08/14/2018 [employer] Patient Education  2020 [employer]  

## 2020-04-30 ENCOUNTER — Inpatient Hospital Stay (HOSPITAL_COMMUNITY): Payer: Commercial Managed Care - PPO

## 2020-04-30 LAB — CBC
HCT: 24.5 % — ABNORMAL LOW (ref 39.0–52.0)
Hemoglobin: 7.9 g/dL — ABNORMAL LOW (ref 13.0–17.0)
MCH: 29.7 pg (ref 26.0–34.0)
MCHC: 32.2 g/dL (ref 30.0–36.0)
MCV: 92.1 fL (ref 80.0–100.0)
Platelets: 131 10*3/uL — ABNORMAL LOW (ref 150–400)
RBC: 2.66 MIL/uL — ABNORMAL LOW (ref 4.22–5.81)
RDW: 13.1 % (ref 11.5–15.5)
WBC: 16.4 10*3/uL — ABNORMAL HIGH (ref 4.0–10.5)
nRBC: 0 % (ref 0.0–0.2)

## 2020-04-30 LAB — GLUCOSE, CAPILLARY
Glucose-Capillary: 110 mg/dL — ABNORMAL HIGH (ref 70–99)
Glucose-Capillary: 116 mg/dL — ABNORMAL HIGH (ref 70–99)
Glucose-Capillary: 121 mg/dL — ABNORMAL HIGH (ref 70–99)
Glucose-Capillary: 136 mg/dL — ABNORMAL HIGH (ref 70–99)
Glucose-Capillary: 137 mg/dL — ABNORMAL HIGH (ref 70–99)
Glucose-Capillary: 145 mg/dL — ABNORMAL HIGH (ref 70–99)
Glucose-Capillary: 98 mg/dL (ref 70–99)

## 2020-04-30 LAB — POCT I-STAT 7, (LYTES, BLD GAS, ICA,H+H)
Acid-base deficit: 3 mmol/L — ABNORMAL HIGH (ref 0.0–2.0)
Bicarbonate: 20.3 mmol/L (ref 20.0–28.0)
Calcium, Ion: 1.15 mmol/L (ref 1.15–1.40)
HCT: 25 % — ABNORMAL LOW (ref 39.0–52.0)
Hemoglobin: 8.5 g/dL — ABNORMAL LOW (ref 13.0–17.0)
O2 Saturation: 86 %
Patient temperature: 98
Potassium: 3.6 mmol/L (ref 3.5–5.1)
Sodium: 136 mmol/L (ref 135–145)
TCO2: 21 mmol/L — ABNORMAL LOW (ref 22–32)
pCO2 arterial: 28.5 mmHg — ABNORMAL LOW (ref 32.0–48.0)
pH, Arterial: 7.459 — ABNORMAL HIGH (ref 7.350–7.450)
pO2, Arterial: 47 mmHg — ABNORMAL LOW (ref 83.0–108.0)

## 2020-04-30 LAB — COOXEMETRY PANEL
Carboxyhemoglobin: 1.5 % (ref 0.5–1.5)
Methemoglobin: 1.1 % (ref 0.0–1.5)
O2 Saturation: 53.1 %
Total hemoglobin: 8.4 g/dL — ABNORMAL LOW (ref 12.0–16.0)

## 2020-04-30 LAB — BASIC METABOLIC PANEL
Anion gap: 10 (ref 5–15)
BUN: 21 mg/dL (ref 8–23)
CO2: 21 mmol/L — ABNORMAL LOW (ref 22–32)
Calcium: 8.1 mg/dL — ABNORMAL LOW (ref 8.9–10.3)
Chloride: 105 mmol/L (ref 98–111)
Creatinine, Ser: 1.37 mg/dL — ABNORMAL HIGH (ref 0.61–1.24)
GFR calc Af Amer: 57 mL/min — ABNORMAL LOW (ref >60)
GFR calc non Af Amer: 49 mL/min — ABNORMAL LOW (ref >60)
Glucose, Bld: 148 mg/dL — ABNORMAL HIGH (ref 70–99)
Potassium: 3.7 mmol/L (ref 3.5–5.1)
Sodium: 136 mmol/L (ref 135–145)

## 2020-04-30 MED ORDER — LEVALBUTEROL HCL 0.63 MG/3ML IN NEBU
0.6300 mg | INHALATION_SOLUTION | Freq: Three times a day (TID) | RESPIRATORY_TRACT | Status: DC
  Administered 2020-05-01 – 2020-05-03 (×6): 0.63 mg via RESPIRATORY_TRACT
  Filled 2020-04-30 (×8): qty 3

## 2020-04-30 MED ORDER — GUAIFENESIN 100 MG/5ML PO SOLN
5.0000 mL | ORAL | Status: DC | PRN
  Administered 2020-04-30 (×2): 100 mg via ORAL
  Filled 2020-04-30 (×2): qty 5

## 2020-04-30 MED ORDER — FUROSEMIDE 10 MG/ML IJ SOLN
40.0000 mg | Freq: Once | INTRAMUSCULAR | Status: AC
  Administered 2020-04-30: 40 mg via INTRAVENOUS
  Filled 2020-04-30: qty 4

## 2020-04-30 MED FILL — Potassium Chloride Inj 2 mEq/ML: INTRAVENOUS | Qty: 40 | Status: AC

## 2020-04-30 MED FILL — Electrolyte-R (PH 7.4) Solution: INTRAVENOUS | Qty: 4000 | Status: AC

## 2020-04-30 MED FILL — Sodium Bicarbonate IV Soln 8.4%: INTRAVENOUS | Qty: 50 | Status: AC

## 2020-04-30 MED FILL — Heparin Sodium (Porcine) Inj 1000 Unit/ML: INTRAMUSCULAR | Qty: 30 | Status: AC

## 2020-04-30 MED FILL — Calcium Chloride Inj 10%: INTRAVENOUS | Qty: 10 | Status: AC

## 2020-04-30 MED FILL — Lidocaine HCl Local Preservative Free (PF) Inj 2%: INTRAMUSCULAR | Qty: 15 | Status: AC

## 2020-04-30 MED FILL — Heparin Sodium (Porcine) Inj 1000 Unit/ML: INTRAMUSCULAR | Qty: 10 | Status: AC

## 2020-04-30 MED FILL — Sodium Chloride IV Soln 0.9%: INTRAVENOUS | Qty: 2000 | Status: AC

## 2020-04-30 NOTE — Anesthesia Postprocedure Evaluation (Signed)
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 (N/A Chest) Endovein Harvest Of Greater Saphenous Vein (Bilateral Leg Upper)     Patient location during evaluation: SICU Anesthesia Type: General Level of consciousness: sedated and patient remains intubated per anesthesia plan Pain management: pain level controlled Vital Signs Assessment: post-procedure vital signs reviewed and stable Respiratory status: patient remains intubated per anesthesia plan and patient on ventilator - see flowsheet for VS Cardiovascular status: stable Anesthetic complications: no    Last Vitals:  Vitals:   04/30/20 0400 04/30/20 0600  BP: 118/70 (!) 143/75  Pulse: (!) 101 99  Resp: (!) 23 (!) 30  Temp:    SpO2: 91% (!) 89%    Last Pain:  Vitals:   04/30/20 0300  TempSrc: Oral  PainSc:                  Lewie Loron

## 2020-04-30 NOTE — Progress Notes (Signed)
CT surgery p.m. Rounds  Patient doing well Minimal pain Ambulated in hallway Attaining sinus rhythm Today's Vitals   04/30/20 1800 04/30/20 1805 04/30/20 1830 04/30/20 1900  BP:  125/77 125/69 128/80  Pulse: 98 97 100 97  Resp: (!) 24 (!) 26 (!) 23 (!) 24  Temp:      TempSrc:      SpO2: 98% 98% 97% 97%  Weight:      Height:      PainSc:       Body mass index is 30.99 kg/m.

## 2020-04-30 NOTE — Progress Notes (Signed)
[address]       [place] [address]             [phone]      2 Days Post-Op Procedure(s) (LRB): CORONARY ARTERY BYPASS GRAFTING (CABG) times four using LIMA to LAD; bilateral endoscopic greater saphenous vein harvest: SVG to Circ; SVG to RAMUS; and SVG to PDA. (N/A) Endovein Harvest Of Greater Saphenous Vein (Bilateral) Subjective: Nursing report he got wheezy last night.  Sats/abg show some hypoxemia- currently on HFNC Sinus tachy with PAC's PVC's  Objective: Vital signs in last 24 hours: Temp:  [97.7 F (36.5 C)-100.8 F (38.2 C)] 97.7 F (36.5 C) (05/13 0300) Pulse Rate:  [78-113] 99 (05/13 0600) Cardiac Rhythm: Normal sinus rhythm;Sinus tachycardia (05/12 2000) Resp:  [18-38] 30 (05/13 0600) BP: (84-143)/(56-106) 143/75 (05/13 0600) SpO2:  [83 %-98 %] 89 % (05/13 0600) Arterial Line BP: (71)/(56) 71/56 (05/12 0900) FiO2 (%):  [40 %] 40 % (05/12 0825) Weight:  [98 kg] 98 kg (05/13 0600)  Hemodynamic parameters for last 24 hours: PAP: (27-35)/(15-19) 35/19  Intake/Output from previous day: 05/12 0701 - 05/13 0700 In: 751 [I.V.:551; IV Piggyback:200] Out: 1050 [Urine:800; Chest Tube:250] Intake/Output this shift: No intake/output data recorded.  General appearance: alert, cooperative and no distress Heart: regular rate and rhythm Lungs: somewhat coarse throughout , no wheeze currently Abdomen: mod distension, + BS, nontennder Extremities: minor edema Wound: incis - dressings CDI  Lab Results: Recent Labs    04/29/20 1956 04/29/20 1956 04/30/20 0244 04/30/20 0255  WBC 13.4*  --  16.4*  --   HGB 8.0*   < > 7.9* 8.5*  HCT 24.2*   < > 24.5* 25.0*  PLT 128*  --  131*  --    < > = values in this interval not displayed.   BMET:  Recent Labs    04/29/20 1956 04/29/20 1956 04/30/20 0244 04/30/20 0255  NA 134*   < > 136 136  K 3.4*   < > 3.7 3.6  CL 104  --  105  --   CO2 21*  --  21*  --   GLUCOSE 139*  --  148*  --   BUN  23  --  21  --   CREATININE 1.53*  --  1.37*  --   CALCIUM 7.8*  --  8.1*  --    < > = values in this interval not displayed.    PT/INR:  Recent Labs    04/28/20 1805  LABPROT 17.2*  INR 1.5*   ABG    Component Value Date/Time   PHART 7.459 (H) 04/30/2020 0255   HCO3 20.3 04/30/2020 0255   TCO2 21 (L) 04/30/2020 0255   ACIDBASEDEF 3.0 (H) 04/30/2020 0255   O2SAT 86.0 04/30/2020 0255   CBG (last 3)  Recent Labs    04/29/20 1954 04/30/20 0009 04/30/20 0412  GLUCAP 136* 136* 137*    Meds Scheduled Meds: . acetaminophen  1,000 mg Oral Q6H   Or  . acetaminophen (TYLENOL) oral liquid 160 mg/5 mL  1,000 mg Per Tube Q6H  . acetaminophen (TYLENOL) oral liquid 160 mg/5 mL  650 mg Per Tube Once   Or  . acetaminophen  650 mg Rectal Once  . aspirin EC  325 mg Oral Daily   Or  . aspirin  324 mg Per Tube Daily  . atorvastatin  40 mg Oral Daily  . bisacodyl  10 mg Oral Daily  Or  . bisacodyl  10 mg Rectal Daily  . Chlorhexidine Gluconate Cloth  6 each Topical Daily  . docusate sodium  200 mg Oral Daily  . enoxaparin (LOVENOX) injection  40 mg Subcutaneous QHS  . insulin aspart  0-24 Units Subcutaneous Q4H  . metoprolol tartrate  12.5 mg Oral BID   Or  . metoprolol tartrate  12.5 mg Per Tube BID  . pantoprazole  40 mg Oral Daily  . sodium chloride flush  10-40 mL Intracatheter Q12H   Continuous Infusions: . DOBUTamine 2.5 mcg/kg/min (04/29/20 1900)  . lactated ringers 20 mL/hr at 04/29/20 1900  . niCARDipine    . norepinephrine (LEVOPHED) Adult infusion Stopped (04/29/20 0923)   PRN Meds:.metoprolol tartrate, morphine injection, ondansetron (ZOFRAN) IV, oxyCODONE, sodium chloride flush, sodium chloride flush, traMADol  Xrays DG Chest Port 1 View  Result Date: 04/29/2020 CLINICAL DATA:  79 year old male with a history CABG yesterday EXAM: PORTABLE CHEST 1 VIEW COMPARISON:  04/28/2020, 04/27/2020 FINDINGS: Cardiomediastinal silhouette unchanged in size and contour,  accentuated by low lung volumes. Surgical changes of median sternotomy and CABG. Endotracheal tube terminates 5.4 cm above the carina. Gastric tube terminates within the left upper quadrant with the side port at the GE junction. Right IJ sheath transmits a Swan-Ganz catheter which terminates in the proximal pulmonary artery. Mediastinal/pleural drains are unchanged. Epicardial pacing leads are not visualized. Low lung volumes with coarsened interstitial markings. No pneumothorax. No pleural effusion. IMPRESSION: Early surgical changes of CABG, with low lung volumes and atelectasis. No visualized pneumothorax with unchanged mediastinal/pleural drains. Unchanged position of endotracheal tube, gastric tube, and right IJ sheath which transmits Swan-Ganz catheter. Electronically Signed   By: Corrie Mckusick D.O.   On: 04/29/2020 07:52   DG Chest Port 1 View  Result Date: 04/28/2020 CLINICAL DATA:  Post CABG EXAM: PORTABLE CHEST 1 VIEW COMPARISON:  04/27/2020 FINDINGS: Changes of CABG. NG tube tip is in the distal esophagus near the GE junction. Endotracheal tube is 5 cm above the carina. Bilateral chest tubes. No pneumothorax. Swan-Ganz catheter tip is in the main pulmonary artery. Bibasilar atelectasis. IMPRESSION: Postoperative changes.  Bibasilar atelectasis.  No pneumothorax. Electronically Signed   By: Rolm Baptise M.D.   On: 04/28/2020 18:20   ECHOCARDIOGRAM COMPLETE  Result Date: 04/28/2020    ECHOCARDIOGRAM REPORT   Patient Name:   RAMAR NOBREGA Date of Exam: 04/28/2020 Medical Rec #:  [id number]      Height:       70.0 in Accession #:    [id number]     Weight:       206.6 lb Date of Birth:  31-Oct-1941      BSA:          2.116 m Patient Age:    47 years       BP:           164/105 mmHg Patient Gender: M              HR:           92 bpm. Exam Location:  Inpatient Procedure: 2D Echo Indications:    I21.3 STEMI  History:        Patient has prior history of Echocardiogram examinations, most                  recent 05/07/2017. Risk Factors:Hypertension and Dyslipidemia.  Sonographer:    Jannett Celestine RDCS (AE) Referring Phys: [place]  1. No intracavitary thrombus is  seen. The dyskinetic anteroapical area is not thinned or hyperechoic, suggesting recent ischemia/infarction. Left ventricular ejection fraction, by estimation, is 30 to 35%. The left ventricle has moderate to severely decreased function. The left ventricle demonstrates regional wall motion abnormalities (see scoring diagram/findings for description). Left ventricular diastolic parameters are consistent with Grade II diastolic dysfunction (pseudonormalization). Elevated left atrial pressure.  2. Right ventricular systolic function is normal. The right ventricular size is normal. A catheter or pacemaker wire is seen in the right ventricle. is visualized.  3. The mitral valve is normal in structure. No evidence of mitral valve regurgitation.  4. The aortic valve is normal in structure. Aortic valve regurgitation is not visualized. Comparison(s): Changes from prior study are noted. The left ventricular function is significantly worse. The left ventricular wall motion abnormalities are new. FINDINGS  Left Ventricle: No intracavitary thrombus is seen. The dyskinetic anteroapical area is not thinned or hyperechoic, suggesting recent ischemia/infarction. Left ventricular ejection fraction, by estimation, is 30 to 35%. The left ventricle has moderate to  severely decreased function. The left ventricle demonstrates regional wall motion abnormalities. The left ventricular internal cavity size was normal in size. There is no left ventricular hypertrophy. Left ventricular diastolic parameters are consistent  with Grade II diastolic dysfunction (pseudonormalization). Elevated left atrial pressure.  LV Wall Scoring: The apical lateral segment, apical septal segment, apical anterior segment, and apex are dyskinetic. The mid anteroseptal segment,  mid anterior segment, and apical inferior segment are akinetic. The antero-lateral wall, inferior wall, posterior wall, basal anteroseptal segment, mid inferoseptal segment, basal anterior segment, and basal inferoseptal segment are normal. Right Ventricle: The right ventricular size is normal. No increase in right ventricular wall thickness. Right ventricular systolic function is normal. Left Atrium: Left atrial size was normal in size. Right Atrium: Right atrial size was normal in size. Pericardium: There is no evidence of pericardial effusion. Mitral Valve: The mitral valve is normal in structure. No evidence of mitral valve regurgitation. Tricuspid Valve: The tricuspid valve is normal in structure. Tricuspid valve regurgitation is not demonstrated. Aortic Valve: The aortic valve is normal in structure. Aortic valve regurgitation is not visualized. Pulmonic Valve: The pulmonic valve was grossly normal. Pulmonic valve regurgitation is not visualized. Aorta: The aortic root is normal in size and structure. IAS/Shunts: No atrial level shunt detected by color flow Doppler. Additional Comments: A catheter or pacemaker wire is seen in the right ventricle. is visualized.  LEFT VENTRICLE PLAX 2D LVIDd:         4.40 cm  Diastology LVIDs:         3.40 cm  LV e' lateral:   11.10 cm/s LV PW:         1.20 cm  LV E/e' lateral: 8.1 LV IVS:        0.90 cm LVOT diam:     2.20 cm LV SV:         47 LV SV Index:   22 LVOT Area:     3.80 cm  LEFT ATRIUM           Index LA diam:      3.40 cm 1.61 cm/m LA Vol (A2C): 58.6 ml 27.69 ml/m  AORTIC VALVE LVOT Vmax:   69.10 cm/s LVOT Vmean:  51.200 cm/s LVOT VTI:    0.124 m  AORTA Ao Root diam: 3.10 cm MITRAL VALVE MV Area (PHT): 6.27 cm    SHUNTS MV Decel Time: 121 msec    Systemic VTI:  0.12 m MV  E velocity: 89.70 cm/s  Systemic Diam: 2.20 cm MV A velocity: 68.90 cm/s MV E/A ratio:  1.30 Rachelle Hora Croitoru MD Electronically signed by Thurmon Fair MD Signature Date/Time: 04/28/2020/10:53:22  AM    Final    ECHO INTRAOPERATIVE TEE  Result Date: 04/28/2020  *INTRAOPERATIVE TRANSESOPHAGEAL REPORT *  Patient Name:   CLIFFTON SPRADLEY Date of Exam: 04/28/2020 Medical Rec #:  [id number]      Height:       70.0 in Accession #:    [id number]     Weight:       206.6 lb Date of Birth:  Jun 15, 1941      BSA:          2.12 m Patient Age:    79 years       BP:           165/105 mmHg Patient Gender: M              HR:           75 bpm. Exam Location:  Anesthesiology Transesophogeal exam was perform intraoperatively during surgical procedure. Patient was closely monitored under general anesthesia during the entirety of examination. Indications:     Coronary artery disease Sonographer:     Renella Cunas RDCS Performing Phys: [id number] Eliezer Lofts LIGHTFOOT Diagnosing Phys: Lewie Loron MD Complications: No known complications during this procedure. POST-OP IMPRESSIONS - Right Ventricle: The right ventricle appears unchanged from pre-bypass. - Aorta: The aorta appears unchanged from pre-bypass. - Left Atrium: The left atrium appears unchanged from pre-bypass. - Left Atrial Appendage: The left atrial appendage appears unchanged from pre-bypass. - Aortic Valve: The aortic valve appears unchanged from pre-bypass. - Mitral Valve: The mitral valve appears unchanged from pre-bypass. - Tricuspid Valve: The tricuspid valve appears unchanged from pre-bypass. - Interatrial Septum: The interatrial septum appears unchanged from pre-bypass. - Interventricular Septum: The interventricular septum appears unchanged from pre-bypass. - Pericardium: The pericardium appears unchanged from pre-bypass. PRE-OP FINDINGS  Left Ventricle: The left ventricle has mildly reduced systolic function, with an ejection fraction of 45-50%. The cavity size was moderately dilated. There is moderate concentric left ventricular hypertrophy. Right Ventricle: The right ventricle has normal systolic function. The cavity was mildly enlarged. There is no increase in  right ventricular wall thickness. Left Atrium: Left atrial size was normal in size. The left atrial appendage is well visualized and there is no evidence of thrombus present. Right Atrium: Right atrial size was normal in size. Interatrial Septum: No atrial level shunt detected by color flow Doppler. Pericardium: There is no evidence of pericardial effusion. There is a small pleural effusion in the right lateral region. Mitral Valve: The mitral valve is normal in structure. Mild thickening of the mitral valve leaflet. Mitral valve regurgitation is mild by color flow Doppler. There is no evidence of mitral valve vegetation. Tricuspid Valve: The tricuspid valve was normal in structure. Tricuspid valve regurgitation is mild by color flow Doppler. There is no evidence of tricuspid valve vegetation. Aortic Valve: The aortic valve is tricuspid There is mild thickening of the aortic valve Aortic valve regurgitation is trivial by color flow Doppler. There is no evidence of aortic valve stenosis. There is no evidence of a vegetation on the aortic valve. Pulmonic Valve: The pulmonic valve was normal in structure. Pulmonic valve regurgitation is trivial by color flow Doppler. 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 +-------------+--------++ AORTIC VALVE          +-------------+--------++  AV Mean Grad:3.0 mmHg +-------------+--------++  Lewie LoronJohn Germeroth MD Electronically signed by Lewie LoronJohn Germeroth MD Signature Date/Time: 04/28/2020/4:53:34 PM    Final     Assessment/Plan: S/P Procedure(s) (LRB): CORONARY ARTERY BYPASS GRAFTING (CABG) times four using LIMA to LAD; bilateral endoscopic greater saphenous vein harvest: SVG to Circ; SVG to RAMUS; and SVG to PDA. (N/A) Endovein Harvest Of Greater Saphenous Vein (Bilateral)  1 doing well POD #2 2 Co-OX 53 on low dose dobut, will d/c.  may need to consider switch to milrinone 3 some hypoxia/hypoxemia-  cont HFNC- will add lasix as spont diuresis has slowed. Creat trend improving. Will add xopenex nebs/robitussin 4 ileus- slow with diet, ambulate, may need reglan 5 ABL anemia is stable, not in transfusion zone 5 CXR low lung volumes, cont IS. pulm toilet, d/c chest tubes 6 routine rehab  LOS: 3 days    Rowe ClackWayne E Nastashia Gallo PA-C Pager [phone] 04/30/2020

## 2020-05-01 LAB — CBC
HCT: 27.2 % — ABNORMAL LOW (ref 39.0–52.0)
Hemoglobin: 8.7 g/dL — ABNORMAL LOW (ref 13.0–17.0)
MCH: 30 pg (ref 26.0–34.0)
MCHC: 32 g/dL (ref 30.0–36.0)
MCV: 93.8 fL (ref 80.0–100.0)
Platelets: 219 10*3/uL (ref 150–400)
RBC: 2.9 MIL/uL — ABNORMAL LOW (ref 4.22–5.81)
RDW: 13.1 % (ref 11.5–15.5)
WBC: 13.3 10*3/uL — ABNORMAL HIGH (ref 4.0–10.5)
nRBC: 0 % (ref 0.0–0.2)

## 2020-05-01 LAB — BASIC METABOLIC PANEL
Anion gap: 10 (ref 5–15)
BUN: 20 mg/dL (ref 8–23)
CO2: 25 mmol/L (ref 22–32)
Calcium: 7.9 mg/dL — ABNORMAL LOW (ref 8.9–10.3)
Chloride: 102 mmol/L (ref 98–111)
Creatinine, Ser: 1.12 mg/dL (ref 0.61–1.24)
GFR calc Af Amer: >60 mL/min (ref >60)
GFR calc non Af Amer: >60 mL/min (ref >60)
Glucose, Bld: 130 mg/dL — ABNORMAL HIGH (ref 70–99)
Potassium: 3.4 mmol/L — ABNORMAL LOW (ref 3.5–5.1)
Sodium: 137 mmol/L (ref 135–145)

## 2020-05-01 LAB — GLUCOSE, CAPILLARY
Glucose-Capillary: 122 mg/dL — ABNORMAL HIGH (ref 70–99)
Glucose-Capillary: 125 mg/dL — ABNORMAL HIGH (ref 70–99)
Glucose-Capillary: 100 mg/dL — ABNORMAL HIGH (ref 70–99)
Glucose-Capillary: 107 mg/dL — ABNORMAL HIGH (ref 70–99)

## 2020-05-01 MED ORDER — ~~LOC~~ CARDIAC SURGERY, PATIENT & FAMILY EDUCATION
Freq: Once | Status: AC
  Administered 2020-05-01: 1

## 2020-05-01 MED ORDER — FUROSEMIDE 40 MG PO TABS
40.0000 mg | ORAL_TABLET | Freq: Every day | ORAL | Status: AC
  Administered 2020-05-01 – 2020-05-03 (×3): 40 mg via ORAL
  Filled 2020-05-01 (×3): qty 1

## 2020-05-01 MED ORDER — SODIUM CHLORIDE 0.9 % IV SOLN: 250.0000 mL | INTRAVENOUS | Status: DC | PRN

## 2020-05-01 MED ORDER — POTASSIUM CHLORIDE CRYS ER 20 MEQ PO TBCR
20.0000 meq | EXTENDED_RELEASE_TABLET | ORAL | Status: DC
  Filled 2020-05-01: qty 1

## 2020-05-01 MED ORDER — PANTOPRAZOLE SODIUM 40 MG PO TBEC
40.0000 mg | DELAYED_RELEASE_TABLET | Freq: Every day | ORAL | Status: DC
  Administered 2020-05-02 – 2020-05-03 (×2): 40 mg via ORAL
  Filled 2020-05-01 (×2): qty 1

## 2020-05-01 MED ORDER — POTASSIUM CHLORIDE CRYS ER 20 MEQ PO TBCR
20.0000 meq | EXTENDED_RELEASE_TABLET | Freq: Every day | ORAL | Status: DC
  Administered 2020-05-01 – 2020-05-03 (×2): 20 meq via ORAL
  Filled 2020-05-01: qty 2
  Filled 2020-05-01 (×2): qty 1

## 2020-05-01 MED ORDER — ACETAMINOPHEN 325 MG PO TABS
650.0000 mg | ORAL_TABLET | Freq: Four times a day (QID) | ORAL | Status: DC | PRN
  Administered 2020-05-02: 650 mg via ORAL
  Filled 2020-05-01 (×2): qty 2

## 2020-05-01 MED ORDER — TRAMADOL HCL 50 MG PO TABS: 50.0000 mg | ORAL_TABLET | ORAL | Status: DC | PRN

## 2020-05-01 MED ORDER — CHLORHEXIDINE GLUCONATE CLOTH 2 % EX PADS
6.0000 | MEDICATED_PAD | Freq: Every day | CUTANEOUS | Status: DC
  Administered 2020-05-02: 6 via TOPICAL

## 2020-05-01 MED ORDER — SODIUM CHLORIDE 0.9% FLUSH
3.0000 mL | Freq: Two times a day (BID) | INTRAVENOUS | Status: DC
  Administered 2020-05-01 – 2020-05-03 (×5): 3 mL via INTRAVENOUS

## 2020-05-01 MED ORDER — ONDANSETRON HCL 4 MG PO TABS: 4.0000 mg | ORAL_TABLET | Freq: Four times a day (QID) | ORAL | Status: DC | PRN

## 2020-05-01 MED ORDER — METOPROLOL TARTRATE 12.5 MG HALF TABLET
12.5000 mg | ORAL_TABLET | Freq: Two times a day (BID) | ORAL | Status: DC
  Administered 2020-05-01: 12.5 mg via ORAL
  Filled 2020-05-01: qty 1

## 2020-05-01 MED ORDER — ASPIRIN EC 81 MG PO TBEC
81.0000 mg | DELAYED_RELEASE_TABLET | Freq: Every day | ORAL | Status: DC
  Administered 2020-05-02 – 2020-05-03 (×2): 81 mg via ORAL
  Filled 2020-05-01 (×2): qty 1

## 2020-05-01 MED ORDER — SODIUM CHLORIDE 0.9% FLUSH: 3.0000 mL | INTRAVENOUS | Status: DC | PRN

## 2020-05-01 MED ORDER — CLOPIDOGREL BISULFATE 75 MG PO TABS
75.0000 mg | ORAL_TABLET | Freq: Every day | ORAL | Status: DC
  Administered 2020-05-01 – 2020-05-03 (×3): 75 mg via ORAL
  Filled 2020-05-01 (×3): qty 1

## 2020-05-01 MED ORDER — INSULIN ASPART 100 UNIT/ML ~~LOC~~ SOLN
0.0000 [IU] | Freq: Three times a day (TID) | SUBCUTANEOUS | Status: DC
  Administered 2020-05-01: 2 [IU] via SUBCUTANEOUS

## 2020-05-01 MED ORDER — POTASSIUM CHLORIDE CRYS ER 20 MEQ PO TBCR
40.0000 meq | EXTENDED_RELEASE_TABLET | Freq: Once | ORAL | Status: AC
  Administered 2020-05-01: 40 meq via ORAL
  Filled 2020-05-01: qty 2

## 2020-05-01 MED ORDER — FUROSEMIDE 10 MG/ML IJ SOLN
40.0000 mg | Freq: Once | INTRAMUSCULAR | Status: AC
  Administered 2020-05-01: 40 mg via INTRAVENOUS
  Filled 2020-05-01: qty 4

## 2020-05-01 MED ORDER — BISACODYL 10 MG RE SUPP: 10.0000 mg | Freq: Every day | RECTAL | Status: DC | PRN

## 2020-05-01 MED ORDER — ONDANSETRON HCL 4 MG/2ML IJ SOLN: 4.0000 mg | Freq: Four times a day (QID) | INTRAMUSCULAR | Status: DC | PRN

## 2020-05-01 MED ORDER — GUAIFENESIN ER 600 MG PO TB12: 600.0000 mg | ORAL_TABLET | Freq: Two times a day (BID) | ORAL | Status: DC | PRN

## 2020-05-01 MED ORDER — BISACODYL 5 MG PO TBEC: 10.0000 mg | DELAYED_RELEASE_TABLET | Freq: Every day | ORAL | Status: DC | PRN

## 2020-05-01 MED ORDER — DOCUSATE SODIUM 100 MG PO CAPS
200.0000 mg | ORAL_CAPSULE | Freq: Every day | ORAL | Status: DC
  Administered 2020-05-02 – 2020-05-03 (×2): 200 mg via ORAL
  Filled 2020-05-01 (×2): qty 2

## 2020-05-01 NOTE — Progress Notes (Signed)
Pt cannot tolerate the Hospital CPAP unit.

## 2020-05-01 NOTE — Progress Notes (Signed)
Pt ambulated x 470 feet independently with 2 liters of oxygen via nasal cannula

## 2020-05-01 NOTE — Progress Notes (Signed)
Patient to room 4E02 from [facility]. Vital signs obtained. CHG bath completed. On monitor CCMD notified. Alert and oriented to room and call light. Call bell within reach.  Ginette Otto. RN

## 2020-05-01 NOTE — Progress Notes (Addendum)
[address]       [place] [address]             [phone]      3 Days Post-Op Procedure(s) (LRB): CORONARY ARTERY BYPASS GRAFTING (CABG) times four using LIMA to LAD; bilateral endoscopic greater saphenous vein harvest: SVG to Circ; SVG to RAMUS; and SVG to PDA. (N/A) Endovein Harvest Of Greater Saphenous Vein (Bilateral) Subjective: Feeling better, does remain on 10 HFNC, sats good  Objective: Vital signs in last 24 hours: Temp:  [98 F (36.7 C)-99.2 F (37.3 C)] 98 F (36.7 C) (05/14 0300) Pulse Rate:  [31-108] 100 (05/14 0700) Cardiac Rhythm: Normal sinus rhythm (05/14 0400) Resp:  [16-32] 18 (05/14 0300) BP: (102-140)/(62-91) 125/87 (05/14 0700) SpO2:  [92 %-100 %] 98 % (05/14 0700) Weight:  [96.6 kg] 96.6 kg (05/14 0330)  Hemodynamic parameters for last 24 hours:    Intake/Output from previous day: 05/13 0701 - 05/14 0700 In: 360.4 [I.V.:360.4] Out: 3110 [Urine:3000; Chest Tube:110] Intake/Output this shift: No intake/output data recorded.  General appearance: alert, cooperative and no distress Heart: regular rate and rhythm and occ extrasystole Lungs: dim in left base Abdomen: less distended, soft, nontender Extremities: minor edema Wound: incis dressed  Lab Results: Recent Labs    04/29/20 1956 04/29/20 1956 04/30/20 0244 04/30/20 0255  WBC 13.4*  --  16.4*  --   HGB 8.0*   < > 7.9* 8.5*  HCT 24.2*   < > 24.5* 25.0*  PLT 128*  --  131*  --    < > = values in this interval not displayed.   BMET:  Recent Labs    04/29/20 1956 04/29/20 1956 04/30/20 0244 04/30/20 0255  NA 134*   < > 136 136  K 3.4*   < > 3.7 3.6  CL 104  --  105  --   CO2 21*  --  21*  --   GLUCOSE 139*  --  148*  --   BUN 23  --  21  --   CREATININE 1.53*  --  1.37*  --   CALCIUM 7.8*  --  8.1*  --    < > = values in this interval not displayed.    PT/INR:  Recent Labs    04/28/20 1805  LABPROT 17.2*  INR 1.5*   ABG    Component Value  Date/Time   PHART 7.459 (H) 04/30/2020 0255   HCO3 20.3 04/30/2020 0255   TCO2 21 (L) 04/30/2020 0255   ACIDBASEDEF 3.0 (H) 04/30/2020 0255   O2SAT 86.0 04/30/2020 0255   CBG (last 3)  Recent Labs    04/30/20 2042 04/30/20 2302 05/01/20 0328  GLUCAP 110* 98 122*    Meds Scheduled Meds: . acetaminophen  1,000 mg Oral Q6H   Or  . acetaminophen (TYLENOL) oral liquid 160 mg/5 mL  1,000 mg Per Tube Q6H  . acetaminophen (TYLENOL) oral liquid 160 mg/5 mL  650 mg Per Tube Once   Or  . acetaminophen  650 mg Rectal Once  . aspirin EC  325 mg Oral Daily   Or  . aspirin  324 mg Per Tube Daily  . atorvastatin  40 mg Oral Daily  . bisacodyl  10 mg Oral Daily   Or  . bisacodyl  10 mg Rectal Daily  . Chlorhexidine Gluconate Cloth  6 each Topical Daily  . docusate sodium  200 mg Oral Daily  . enoxaparin (LOVENOX) injection  40 mg Subcutaneous QHS  . insulin aspart  0-24 Units Subcutaneous Q4H  . levalbuterol  0.63 mg Nebulization TID  . metoprolol tartrate  12.5 mg Oral BID   Or  . metoprolol tartrate  12.5 mg Per Tube BID  . pantoprazole  40 mg Oral Daily  . sodium chloride flush  10-40 mL Intracatheter Q12H   Continuous Infusions: . lactated ringers 15 mL/hr at 05/01/20 0700  . niCARDipine    . norepinephrine (LEVOPHED) Adult infusion Stopped (04/29/20 0923)   PRN Meds:.guaiFENesin, metoprolol tartrate, morphine injection, ondansetron (ZOFRAN) IV, oxyCODONE, sodium chloride flush, sodium chloride flush, traMADol  Xrays DG Chest Port 1 View  Result Date: 04/30/2020 CLINICAL DATA:  Recent CABG. EXAM: PORTABLE CHEST 1 VIEW COMPARISON:  Chest x-ray from yesterday. FINDINGS: Interval removal of the endotracheal tube, enteric tube, and Swan-Ganz catheter. The right internal jugular sheath remains in place. Bilateral chest tubes and mediastinal drain are unchanged. Stable cardiomediastinal silhouette status post CABG. Persistent low lung volumes with coarsened interstitial markings. No  focal consolidation, pleural effusion, or pneumothorax. No acute osseous abnormality. IMPRESSION: Interval extubation.  Persistent low lung volumes. Electronically Signed   By: Titus Dubin M.D.   On: 04/30/2020 08:33    Assessment/Plan: S/P Procedure(s) (LRB): CORONARY ARTERY BYPASS GRAFTING (CABG) times four using LIMA to LAD; bilateral endoscopic greater saphenous vein harvest: SVG to Circ; SVG to RAMUS; and SVG to PDA. (N/A) Endovein Harvest Of Greater Saphenous Vein (Bilateral)  1 doing better 2 stable hemodynamics in sinus with PVC's 3 sats good on 10 HFNC- routine rehab/pulm toilet 4 no new CXR/labs- will check CXR in am- labs today 5 BS adeq control, not a diabetic 6 clinical ileus improved 7 good diuresis, will give one dose IV today  LOS: 4 days    [name] PA-C Pager [phone] 05/01/2020   With presentation as STEMI, will continue Plavix, asa 81 mg, beta blocker, statin- add ace when bp and cr allow  patient walked around unit this am Holding sinus Plan transfer to step down Poss home Sunday Monday I have seen and examined Joshua Mack and agree with the above assessment  and plan.  Joshua Mack Beeper [phone] Office [phone] 05/01/2020 12:30 PM

## 2020-05-02 ENCOUNTER — Inpatient Hospital Stay (HOSPITAL_COMMUNITY): Payer: Commercial Managed Care - PPO

## 2020-05-02 LAB — TYPE AND SCREEN
ABO/RH(D): O POS
Antibody Screen: NEGATIVE
Unit division: 0
Unit division: 0
Unit division: 0

## 2020-05-02 LAB — CBC
HCT: 25.2 % — ABNORMAL LOW (ref 39.0–52.0)
Hemoglobin: 8.2 g/dL — ABNORMAL LOW (ref 13.0–17.0)
MCH: 30.5 pg (ref 26.0–34.0)
MCHC: 32.5 g/dL (ref 30.0–36.0)
MCV: 93.7 fL (ref 80.0–100.0)
Platelets: 241 10*3/uL (ref 150–400)
RBC: 2.69 MIL/uL — ABNORMAL LOW (ref 4.22–5.81)
RDW: 13.2 % (ref 11.5–15.5)
WBC: 11.3 10*3/uL — ABNORMAL HIGH (ref 4.0–10.5)
nRBC: 0.2 % (ref 0.0–0.2)

## 2020-05-02 LAB — BASIC METABOLIC PANEL
Anion gap: 10 (ref 5–15)
BUN: 18 mg/dL (ref 8–23)
CO2: 25 mmol/L (ref 22–32)
Calcium: 7.9 mg/dL — ABNORMAL LOW (ref 8.9–10.3)
Chloride: 105 mmol/L (ref 98–111)
Creatinine, Ser: 1.14 mg/dL (ref 0.61–1.24)
GFR calc Af Amer: >60 mL/min (ref >60)
GFR calc non Af Amer: >60 mL/min (ref >60)
Glucose, Bld: 113 mg/dL — ABNORMAL HIGH (ref 70–99)
Potassium: 3.5 mmol/L (ref 3.5–5.1)
Sodium: 140 mmol/L (ref 135–145)

## 2020-05-02 LAB — BPAM RBC
Blood Product Expiration Date: 202106122359
Blood Product Expiration Date: 202106122359
Blood Product Expiration Date: 202106122359
Unit Type and Rh: 5100
Unit Type and Rh: 5100
Unit Type and Rh: 5100

## 2020-05-02 LAB — GLUCOSE, CAPILLARY
Glucose-Capillary: 109 mg/dL — ABNORMAL HIGH (ref 70–99)
Glucose-Capillary: 96 mg/dL (ref 70–99)
Glucose-Capillary: 145 mg/dL — ABNORMAL HIGH (ref 70–99)
Glucose-Capillary: 169 mg/dL — ABNORMAL HIGH (ref 70–99)

## 2020-05-02 MED ORDER — DM-GUAIFENESIN ER 30-600 MG PO TB12: 1.0000 | ORAL_TABLET | Freq: Two times a day (BID) | ORAL | Status: DC | PRN

## 2020-05-02 MED ORDER — METOPROLOL TARTRATE 25 MG PO TABS
25.0000 mg | ORAL_TABLET | Freq: Two times a day (BID) | ORAL | Status: DC
  Administered 2020-05-02 – 2020-05-03 (×3): 25 mg via ORAL
  Filled 2020-05-02 (×3): qty 1

## 2020-05-02 MED ORDER — TAB-A-VITE/IRON PO TABS
1.0000 | ORAL_TABLET | Freq: Every day | ORAL | Status: DC
  Administered 2020-05-02 – 2020-05-03 (×2): 1 via ORAL
  Filled 2020-05-02 (×2): qty 1

## 2020-05-02 MED ORDER — POTASSIUM CHLORIDE CRYS ER 20 MEQ PO TBCR
40.0000 meq | EXTENDED_RELEASE_TABLET | Freq: Once | ORAL | Status: DC
  Filled 2020-05-02: qty 2

## 2020-05-02 MED ORDER — LACTULOSE 10 GM/15ML PO SOLN: 20.0000 g | Freq: Every day | ORAL | Status: DC | PRN

## 2020-05-02 MED ORDER — ASPIRIN 81 MG PO TBEC: 81.0000 mg | DELAYED_RELEASE_TABLET | Freq: Every day | ORAL | Status: AC

## 2020-05-02 NOTE — Progress Notes (Addendum)
[address]       ,[place] [address]             [phone]      4 Days Post-Op. Procedure(s) (LRB): CORONARY ARTERY BYPASS GRAFTING (CABG) times four using LIMA to LAD; bilateral endoscopic greater saphenous vein harvest: SVG to Circ; SVG to RAMUS; and SVG to PDA. (N/A) Endovein Harvest Of Greater Saphenous Vein (Bilateral)   Subjective:  Doing well.  He does have some incisional discomfort especially when he coughs.  He is coughing up some clear sputum.  + ambulation, No BM yet  Objective: Vital signs in last 24 hours: Temp:  [97.8 F (36.6 C)-99.6 F (37.6 C)] 99.6 F (37.6 C) (05/15 0419) Pulse Rate:  [95-118] 104 (05/15 0419) Cardiac Rhythm: Sinus tachycardia (05/15 0419) Resp:  [16-20] 20 (05/15 0419) BP: (109-148)/(73-116) 117/80 (05/15 0419) SpO2:  [91 %-100 %] 93 % (05/15 0419) Weight:  [94.3 kg-95.2 kg] 94.3 kg (05/15 0419)  Intake/Output from previous day: 05/14 0701 - 05/15 0700 In: 600 [P.O.:600] Out: 1550 [Urine:1550] Intake/Output this shift: Total I/O In: 120 [P.O.:120] Out: 500 [Urine:500]  General appearance: alert, cooperative and no distress Heart: regular rate and rhythm Lungs: clear to auscultation bilaterally Abdomen: soft, non-tender; bowel sounds normal; no masses,  no organomegaly Extremities: edema 1+ pitting Wound: clean and dry  Lab Results: Recent Labs    05/01/20 0931 05/02/20 0439  WBC 13.3* 11.3*  HGB 8.7* 8.2*  HCT 27.2* 25.2*  PLT 219 241   BMET:  Recent Labs    05/01/20 0931 05/02/20 0439  NA 137 140  K 3.4* 3.5  CL 102 105  CO2 25 25  GLUCOSE 130* 113*  BUN 20 18  CREATININE 1.12 1.14  CALCIUM 7.9* 7.9*    PT/INR: No results for input(s): LABPROT, INR in the last 72 hours. ABG    Component Value Date/Time   PHART 7.459 (H) 04/30/2020 0255   HCO3 20.3 04/30/2020 0255   TCO2 21 (L) 04/30/2020 0255   ACIDBASEDEF 3.0 (H) 04/30/2020 0255   O2SAT 86.0 04/30/2020 0255   CBG (last 3)    Recent Labs    05/01/20 1149 05/01/20 1555 05/02/20 0615  GLUCAP 100* 107* 109*    Assessment/Plan: S/P Procedure(s) (LRB): CORONARY ARTERY BYPASS GRAFTING (CABG) times four using LIMA to LAD; bilateral endoscopic greater saphenous vein harvest: SVG to Circ; SVG to RAMUS; and SVG to PDA. (N/A) Endovein Harvest Of Greater Saphenous Vein (Bilateral)  1. CV- Sinus Tachycardia with occasional PVC- will increase Lopressor to 25 mg BID 2. Pulm- no acute issues, weaning oxygen as tolerated on 2L [place] currently, unable to tolerate hospital CPAP machine, continue IS, will add Mucinex prn for cough.... 2V CXR has been ordered 3. Renal- creatinine remains stable, weight is almost at baseline, Lasix 40 mg tablet ordered for today 4. Hypokalemia- mild will give 40 meq once and continue 20 meq dose  daily... repeat BMET in AM 5. GI- No BM yet, + flatus, will add prn lactulose 6. Expected post operative blood loss anemia, stable at 8.2, will add iron 7. CBGs- controlled, patient is not a diabetic, will d/c SSIP 8. Dispo- patient stable, will titrate BB for additional HR control, continue to wean oxygen as tolerated, will review CXR once complete, continue diuresis, patient may be ready for d/c in next 24-48 hours, however I suspect likely Monday   LOS: 5 days    [place]: mild atelectasis bilaterally, trace posterior  left side pleural effusion, no pneumothorax  Erin Barrett, PA-C  05/02/2020   I have seen and examined the patient and agree with the assessment and plan as outlined.  Purcell Nails, MD 05/02/2020 3:47 PM

## 2020-05-02 NOTE — Progress Notes (Signed)
CARDIAC REHAB PHASE I    MODE:  Ambulation: 470 ft   POST:  Rate/Rhythm: 128 ST with PVC  BP:  Sitting: 121/81    SaO2: 94 3L  Pt seen ambulating in hallway independently with O2 tank. Joined pt. Pt states he is feeling better and is anxious to go home. Pt sats 94 on 3L. Pt demonstrating 1000 on IS. D/c education completed with pt. Pt educated on importance of site care and monitoring incision daily. Encouraged continued IS use, walks, and sternal precautions. Pt given in-the-tube sheet along with heart healthy diet. Reviewed restrictions and exercise guidelines. Will refer to CRP II [place]. Pt is interested in participating in Virtual Cardiac and Pulmonary Rehab. Pt advised that Virtual Cardiac and Pulmonary Rehab is provided at no cost to the patient.  Checklist:  1. Pt has smart device  ie smartphone and/or ipad for downloading an app  Yes 2. Reliable internet/wifi service    Yes 3. Understands how to use their smartphone and navigate within an app.  Yes  Pt verbalized understanding and is in agreement.  [id number] Reynold Bowen, RN BSN 05/02/2020 9:00 AM

## 2020-05-03 LAB — GLUCOSE, CAPILLARY
Glucose-Capillary: 141 mg/dL — ABNORMAL HIGH (ref 70–99)
Glucose-Capillary: 110 mg/dL — ABNORMAL HIGH (ref 70–99)
Glucose-Capillary: 101 mg/dL — ABNORMAL HIGH (ref 70–99)

## 2020-05-03 LAB — BASIC METABOLIC PANEL
Anion gap: 10 (ref 5–15)
BUN: 18 mg/dL (ref 8–23)
CO2: 27 mmol/L (ref 22–32)
Calcium: 8 mg/dL — ABNORMAL LOW (ref 8.9–10.3)
Chloride: 102 mmol/L (ref 98–111)
Creatinine, Ser: 1.2 mg/dL (ref 0.61–1.24)
GFR calc Af Amer: >60 mL/min (ref >60)
GFR calc non Af Amer: 58 mL/min — ABNORMAL LOW (ref >60)
Glucose, Bld: 114 mg/dL — ABNORMAL HIGH (ref 70–99)
Potassium: 3.6 mmol/L (ref 3.5–5.1)
Sodium: 139 mmol/L (ref 135–145)

## 2020-05-03 MED ORDER — CLOPIDOGREL BISULFATE 75 MG PO TABS: 75.0000 mg | ORAL_TABLET | Freq: Every day | ORAL | 3 refills | Status: DC

## 2020-05-03 MED ORDER — ATORVASTATIN CALCIUM 40 MG PO TABS: 40.0000 mg | ORAL_TABLET | Freq: Every day | ORAL | 3 refills | Status: DC

## 2020-05-03 MED ORDER — METOPROLOL TARTRATE 25 MG PO TABS: 25.0000 mg | ORAL_TABLET | Freq: Two times a day (BID) | ORAL | 3 refills | Status: DC

## 2020-05-03 MED ORDER — TRAMADOL HCL 50 MG PO TABS: 50.0000 mg | ORAL_TABLET | ORAL | 0 refills | Status: DC | PRN

## 2020-05-03 NOTE — Progress Notes (Signed)
Discharge instructions given to patient. IV removed, clean and intact. Medications and wound care reviewed. All questions answered. Pt escorted home with wife.  Anecia Nusbaum R Averi Kilty, RN  

## 2020-05-03 NOTE — Progress Notes (Signed)
Pt ambulated x 470 feet independently in hall on room air, oxygen saturations 91-93%, pt tolerated well

## 2020-05-03 NOTE — Progress Notes (Addendum)
      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 (N/A) Endovein Harvest Of Greater Saphenous Vein (Bilateral)   Subjective:  Patient feels great.  He has already been up and ambulated in the hallway without difficulty.  He would really like to go home today. + BM  Objective: Vital signs in last 24 hours: Temp:  [98 F (36.7 C)-99.5 F (37.5 C)] 99.5 F (37.5 C) (05/16 0339) Pulse Rate:  [87-110] 98 (05/16 0339) Cardiac Rhythm: Normal sinus rhythm (05/16 0339) Resp:  [16-20] 18 (05/16 0339) BP: (103-125)/(68-78) 125/74 (05/16 0339) SpO2:  [91 %-100 %] 91 % (05/16 0339) Weight:  [94.2 kg] 94.2 kg (05/16 0339)  Intake/Output from previous day: 05/15 0701 - 05/16 0700 In: 600 [P.O.:600] Out: 250 [Urine:250] Intake/Output this shift: Total I/O In: 120 [P.O.:120] Out: -   General appearance: alert, cooperative and no distress Heart: regular rate and rhythm and tachy Lungs: clear to auscultation bilaterally Abdomen: soft, non-tender; bowel sounds normal; no masses,  no organomegaly Extremities: edema trace, improved Wound: clean and dry  Lab Results: Recent Labs    05/01/20 0931 05/02/20 0439  WBC 13.3* 11.3*  HGB 8.7* 8.2*  HCT 27.2* 25.2*  PLT 219 241   BMET:  Recent Labs    05/02/20 0439 05/03/20 0228  NA 140 139  K 3.5 3.6  CL 105 102  CO2 25 27  GLUCOSE 113* 114*  BUN 18 18  CREATININE 1.14 1.20  CALCIUM 7.9* 8.0*    PT/INR: No results for input(s): LABPROT, INR in the last 72 hours. 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 (N/A) Endovein Harvest Of Greater Saphenous Vein (Bilateral)  1. CV- Sinus Tach, BP improved- continue Lopressor 2. Pulm- no acute issues, continue IS  3. Renal- creatinine remains stable, edema improving, weight is below baseline will give last dose of lasix then d/c, hypokalemia improving, supplement K 4. GI- constipation resolved 5. Dispo- patient stable, BP improved with titration of BB, remains tachycardic, creatinine stable, hypokalemia resolved, ambulating independently, will d/c home today.   LOS: 6 days    Lowella Dandy, PA-C  05/03/2020

## 2020-05-04 ENCOUNTER — Emergency Department (HOSPITAL_COMMUNITY)
Admission: EM | Admit: 2020-05-04 | Discharge: 2020-05-05 | Disposition: A | Discharge status: Home / Self Care | Payer: Commercial Managed Care - PPO | Attending: Emergency Medicine | Admitting: Emergency Medicine

## 2020-05-04 ENCOUNTER — Encounter (HOSPITAL_COMMUNITY): Payer: Self-pay | Admitting: *Deleted

## 2020-05-04 ENCOUNTER — Other Ambulatory Visit: Payer: Self-pay

## 2020-05-04 DIAGNOSIS — Z7902 Long term (current) use of antithrombotics/antiplatelets: Secondary | ICD-10-CM | POA: Diagnosis not present

## 2020-05-04 DIAGNOSIS — E876 Hypokalemia: Secondary | ICD-10-CM | POA: Insufficient documentation

## 2020-05-04 DIAGNOSIS — Z9889 Other specified postprocedural states: Secondary | ICD-10-CM | POA: Insufficient documentation

## 2020-05-04 DIAGNOSIS — Z951 Presence of aortocoronary bypass graft: Secondary | ICD-10-CM | POA: Insufficient documentation

## 2020-05-04 DIAGNOSIS — R55 Syncope and collapse: Secondary | ICD-10-CM

## 2020-05-04 DIAGNOSIS — I2510 Atherosclerotic heart disease of native coronary artery without angina pectoris: Secondary | ICD-10-CM | POA: Diagnosis not present

## 2020-05-04 DIAGNOSIS — I119 Hypertensive heart disease without heart failure: Secondary | ICD-10-CM | POA: Insufficient documentation

## 2020-05-04 DIAGNOSIS — Z79899 Other long term (current) drug therapy: Secondary | ICD-10-CM | POA: Diagnosis not present

## 2020-05-04 HISTORY — DX: Atherosclerotic heart disease of native coronary artery without angina pectoris: I25.10

## 2020-05-04 LAB — CBC
HCT: 26.7 % — ABNORMAL LOW (ref 39.0–52.0)
Hemoglobin: 8.5 g/dL — ABNORMAL LOW (ref 13.0–17.0)
MCH: 29.9 pg (ref 26.0–34.0)
MCHC: 31.8 g/dL (ref 30.0–36.0)
MCV: 94 fL (ref 80.0–100.0)
Platelets: 428 10*3/uL — ABNORMAL HIGH (ref 150–400)
RBC: 2.84 MIL/uL — ABNORMAL LOW (ref 4.22–5.81)
RDW: 13.1 % (ref 11.5–15.5)
WBC: 14 10*3/uL — ABNORMAL HIGH (ref 4.0–10.5)
nRBC: 0 % (ref 0.0–0.2)

## 2020-05-04 LAB — URINALYSIS, ROUTINE W REFLEX MICROSCOPIC
Bilirubin Urine: NEGATIVE
Glucose, UA: NEGATIVE mg/dL
Hgb urine dipstick: NEGATIVE
Ketones, ur: NEGATIVE mg/dL
Leukocytes,Ua: NEGATIVE
Nitrite: NEGATIVE
Protein, ur: NEGATIVE mg/dL
Specific Gravity, Urine: 1.004 — ABNORMAL LOW (ref 1.005–1.030)
pH: 5 (ref 5.0–8.0)

## 2020-05-04 LAB — BASIC METABOLIC PANEL
Anion gap: 12 (ref 5–15)
BUN: 18 mg/dL (ref 8–23)
CO2: 24 mmol/L (ref 22–32)
Calcium: 8.4 mg/dL — ABNORMAL LOW (ref 8.9–10.3)
Chloride: 98 mmol/L (ref 98–111)
Creatinine, Ser: 1.13 mg/dL (ref 0.61–1.24)
GFR calc Af Amer: >60 mL/min (ref >60)
GFR calc non Af Amer: >60 mL/min (ref >60)
Glucose, Bld: 118 mg/dL — ABNORMAL HIGH (ref 70–99)
Potassium: 3.4 mmol/L — ABNORMAL LOW (ref 3.5–5.1)
Sodium: 134 mmol/L — ABNORMAL LOW (ref 135–145)

## 2020-05-04 LAB — CBG MONITORING, ED: Glucose-Capillary: 108 mg/dL — ABNORMAL HIGH (ref 70–99)

## 2020-05-04 MED ORDER — POTASSIUM CHLORIDE CRYS ER 20 MEQ PO TBCR
20.0000 meq | EXTENDED_RELEASE_TABLET | Freq: Every day | ORAL | 0 refills | Status: DC
Start: 2020-05-04 — End: 2020-05-21

## 2020-05-04 MED ORDER — POTASSIUM CHLORIDE CRYS ER 20 MEQ PO TBCR
40.0000 meq | EXTENDED_RELEASE_TABLET | Freq: Once | ORAL | Status: AC
  Administered 2020-05-04: 40 meq via ORAL
  Filled 2020-05-04: qty 2

## 2020-05-04 MED ORDER — SODIUM CHLORIDE 0.9% FLUSH: 3.0000 mL | Freq: Once | INTRAVENOUS | Status: DC

## 2020-05-04 NOTE — ED Triage Notes (Signed)
Pt arrived via [facility] after having a syncope episode at home and walked out to car.  Pt had stemi on Friday 5/13 and then went straight to [facility] and had CABGX4 vessel.  Pt is alert and oriented. Pt had some questionable V2-3 elevation. Pt received ASA 3254mg  en route.

## 2020-05-04 NOTE — Discharge Instructions (Addendum)
It was our pleasure to provide your ER care today - we hope that you feel better.  Rest. Drink plenty of fluids.   From today's labs, your potassium level is mildly low - take potassium supplement as prescribed, eat plenty of fruits and vegetables, and follow up with primary care doctor in 1 week.   Follow up with your doctor and surgeon in the coming week.  Also follow up with cardiologist in next couple days - discuss possible outpatient/holter monitor.   Return to ER right away if worse, new symptoms, fevers, increased trouble breathing, persistent fast heart beat, weakness/faintness, or other concern.

## 2020-05-04 NOTE — ED Notes (Signed)
Pt ambulated appropriately and had no complaints 

## 2020-05-04 NOTE — ED Provider Notes (Addendum)
[facility] Provider Note   CSN: [id number] Arrival date & time: 05/04/20  1819     History Chief Complaint  Patient presents with  . Loss of Consciousness    [facility] is a 79 y.o. male.  Patient presents via EMS, after brief syncopal event. Patient was sitting, when felt like was going to have coughing episode, grabbed his pillow (recent cabg 3 days ago), and then was noted briefly to stare off unresponsive. Did not fall to ground or lose postural tone. Lasted 1-2 seconds. No confusion afterwards. Did gently shake, but no generalized tc seizure activity. No incontinence. No oral injury. No hx same. No palpitations. No chest pain or discomfort. No sob or increased dyspnea. Occasional non prod cough. No fever or chills. +somewhat decreased po intake today. No dysuria or gu c/o. No current faintness or dizziness.   The history is provided by the patient.  Loss of Consciousness Associated symptoms: no chest pain, no confusion, no diaphoresis, no fever, no headaches, no palpitations, no shortness of breath, no vomiting and no weakness        Past Medical History:  Diagnosis Date  . Coronary artery disease   . High cholesterol   . Hypertension   . Sleep apnea     Patient Active Problem List   Diagnosis Date Noted  . Multiple vessel coronary artery disease: Acute mid LAD 100% occlusion, 90% RI and CTO of LCx and RCA 04/28/2020  . Acute combined systolic and diastolic heart failure (HCC) 04/28/2020  . 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 Acute ST elevation myocardial infarction (STEMI) of anterolateral wall (HCC)-likely delayed presentation 04/27/2020  . Essential hypertension 04/27/2020  . DVT (deep venous thrombosis) (HCC) 05/08/2017  . OSA (obstructive sleep apnea) 05/08/2017  . Hyponatremia 05/07/2017  . Respiratory failure with hypoxia (HCC) 05/07/2017  . Spinal stenosis at L4-L5 level 05/03/2017    Past Surgical History:  Procedure  Laterality Date  . CARDIAC SURGERY    . CORONARY ARTERY BYPASS GRAFT N/A 04/28/2020   Procedure: CORONARY ARTERY BYPASS GRAFTING (CABG) times four using LIMA to LAD; bilateral endoscopic greater saphenous vein harvest: SVG to Circ; SVG to RAMUS; and SVG to PDA.;  Surgeon: Corliss Skains, MD;  Location: [facility];  Service: Open Heart Surgery;  Laterality: N/A;  . ENDOVEIN HARVEST OF GREATER SAPHENOUS VEIN Bilateral 04/28/2020   Procedure: [name];  Surgeon: Corliss Skains, MD;  Location: [facility] OR;  Service: Open Heart Surgery;  Laterality: Bilateral;  . IABP INSERTION N/A 04/27/2020   Procedure: IABP Insertion;  Surgeon: Swaziland, Peter M, MD;  Location: [facility] INVASIVE CV LAB;  Service: Cardiovascular;  Laterality: N/A;  . LUMBAR LAMINECTOMY/DECOMPRESSION MICRODISCECTOMY N/A 05/03/2017   Procedure: Microlumbar decompression L4-5, L3-4,  L5-S1 WITH LATERAL AUTOGRAFT FUSION;  Surgeon: Jene Every, MD;  Location: [facility] ORS;  Service: Orthopedics;  Laterality: N/A;  . RIGHT/LEFT HEART CATH AND CORONARY ANGIOGRAPHY N/A 04/27/2020   Procedure: RIGHT/LEFT HEART CATH AND CORONARY ANGIOGRAPHY;  Surgeon: Swaziland, Peter M, MD;  Location: [facility] INVASIVE CV LAB;  Service: Cardiovascular;  Laterality: N/A;       No family history on file.  Social History   Tobacco Use  . Smoking status: Never Smoker  . Smokeless tobacco: Never Used  Substance Use Topics  . Alcohol use: No  . Drug use: No    Home Medications Prior to Admission medications   Medication Sig Start Date End Date Taking?  Authorizing Provider  acetaminophen (TYLENOL) 500 MG tablet Take 500-1,000 mg by mouth 2 (two) times daily as needed for mild pain or headache.     [provider]  aspirin EC 81 MG EC tablet Take 1 tablet (81 mg total) by mouth daily. 05/03/20   Barrett, Erin R, PA-C  atorvastatin (LIPITOR) 40 MG tablet Take 1 tablet (40 mg total) by mouth daily. 05/03/20   Barrett, Rae Roam, PA-C    Chromium 1000 MCG TABS Take 1,000 mcg by mouth daily.    [provider]  clopidogrel (PLAVIX) 75 MG tablet Take 1 tablet (75 mg total) by mouth daily. 05/03/20   Barrett, Rae Roam, PA-C  dextromethorphan-guaiFENesin (MUCINEX DM) 30-600 MG 12hr tablet Take 1 tablet by mouth 2 (two) times daily as needed for cough. 05/02/20   Barrett, Erin R, PA-C  docusate sodium (COLACE) 100 MG capsule Take 1 capsule (100 mg total) by mouth 2 (two) times daily as needed for mild constipation. 05/03/17   Jene Every, MD  Garlic 1000 MG CAPS Take 2,000 mg by mouth daily.     [provider]  Chilton Si Tea 250 MG CAPS Take 500 mg by mouth at bedtime.    [provider]  Boris Lown Oil 1000 MG CAPS Take 1,000 mg by mouth daily.    [provider]  L-Arginine 500 MG CAPS Take 500 mg by mouth daily.    [provider]  Melatonin 5 MG TABS Take 5 mg by mouth at bedtime.    [provider]  metoprolol tartrate (LOPRESSOR) 25 MG tablet Take 1 tablet (25 mg total) by mouth 2 (two) times daily. 05/03/20   Barrett, Rae Roam, PA-C  Misc Natural Products (OSTEO BI-FLEX JOINT SHIELD) TABS Take 1 tablet by mouth daily.    [provider]  Misc Natural Products [facility] PROSTATE) CAPS Take 1 capsule by mouth daily.    [provider]  Multiple Vitamins-Minerals (CENTRUM SILVER 50+MEN) TABS Take 1 tablet by mouth daily with breakfast.    [provider]  polyethylene glycol (MIRALAX / GLYCOLAX) packet Take 17 g by mouth daily. Patient not taking: Reported on 04/28/2020 05/03/17   Jene Every, MD  PRESCRIPTION MEDICATION CPAP- At bedtime and during times of rest    [provider]  Resveratrol 250 MG CAPS Take 250 mg by mouth daily.    [provider]  traMADol (ULTRAM) 50 MG tablet Take 1 tablet (50 mg total) by mouth every 4 (four) hours as needed for moderate pain. 05/03/20   Barrett, Rae Roam, PA-C  Turmeric Curcumin 500 MG CAPS Take 1,000 mg  by mouth daily.     [provider]  Vitamin Mixture (ESTER-C PO) Take 500 mg by mouth daily.    [provider]    Allergies    Fentanyl, Midazolam, Amoxicillin, Other, and Tavist nd [loratadine]  Review of Systems   Review of Systems  Constitutional: Negative for chills, diaphoresis and fever.  HENT: Negative for sore throat.   Eyes: Negative for visual disturbance.  Respiratory: Negative for shortness of breath.   Cardiovascular: Positive for syncope. Negative for chest pain, palpitations and leg swelling.  Gastrointestinal: Negative for abdominal pain, blood in stool, diarrhea and vomiting.  Genitourinary: Negative for dysuria and flank pain.  Musculoskeletal: Negative for back pain and neck pain.  Skin: Negative for rash.  Neurological: Negative for weakness, numbness and headaches.  Hematological: Does not bruise/bleed easily.  Psychiatric/Behavioral: Negative for confusion.  Physical Exam Updated Vital Signs BP 113/72   Pulse 85   Temp 98.5 F (36.9 C) (Oral)   Resp 20   SpO2 92%   Physical Exam Vitals and nursing note reviewed.  Constitutional:      Appearance: Normal appearance. He is well-developed.  HENT:     Head: Atraumatic.     Nose: Nose normal.     Mouth/Throat:     Mouth: Mucous membranes are moist.     Pharynx: Oropharynx is clear.  Eyes:     General: No scleral icterus.    Conjunctiva/sclera: Conjunctivae normal.     Pupils: Pupils are equal, round, and reactive to light.  Neck:     Vascular: No carotid bruit.     Trachea: No tracheal deviation.  Cardiovascular:     Rate and Rhythm: Normal rate and regular rhythm.     Pulses: Normal pulses.     Heart sounds: Normal heart sounds. No murmur. No friction rub. No gallop.   Pulmonary:     Effort: Pulmonary effort is normal. No accessory muscle usage or respiratory distress.     Breath sounds: Normal breath sounds.  Abdominal:     General: Bowel sounds are normal. There is no  distension.     Palpations: Abdomen is soft.     Tenderness: There is no abdominal tenderness. There is no guarding.     Comments: No pulsatile mass.   Genitourinary:    Comments: No cva tenderness. Musculoskeletal:        General: No swelling or tenderness.     Cervical back: Normal range of motion and neck supple. No rigidity.  Skin:    General: Skin is warm and dry.     Findings: No rash.  Neurological:     Mental Status: He is alert.     Comments: Alert, speech clear. Motor intact bil, stre 5/5. sens grossly intact.   Psychiatric:        Mood and Affect: Mood normal.     ED Results / Procedures / Treatments   Labs (all labs ordered are listed, but only abnormal results are displayed) Results for orders placed or performed during the hospital encounter of 05/04/20  Basic metabolic panel  Result Value Ref Range   Sodium 134 (L) 135 - 145 mmol/L   Potassium 3.4 (L) 3.5 - 5.1 mmol/L   Chloride 98 98 - 111 mmol/L   CO2 24 22 - 32 mmol/L   Glucose, Bld 118 (H) 70 - 99 mg/dL   BUN 18 8 - 23 mg/dL   Creatinine, Ser 1.611.13 0.61 - 1.24 mg/dL   Calcium 8.4 (L) 8.9 - 10.3 mg/dL   GFR calc non Af Amer >60 >60 mL/min   GFR calc Af Amer >60 >60 mL/min   Anion gap 12 5 - 15  CBC  Result Value Ref Range   WBC 14.0 (H) 4.0 - 10.5 K/uL   RBC 2.84 (L) 4.22 - 5.81 MIL/uL   Hemoglobin 8.5 (L) 13.0 - 17.0 g/dL   HCT 09.626.7 (L) 04.539.0 - 40.952.0 %   MCV 94.0 80.0 - 100.0 fL   MCH 29.9 26.0 - 34.0 pg   MCHC 31.8 30.0 - 36.0 g/dL   RDW 81.113.1 91.411.5 - 78.215.5 %   Platelets 428 (H) 150 - 400 K/uL   nRBC 0.0 0.0 - 0.2 %  Urinalysis, Routine w reflex microscopic  Result Value Ref Range   Color, Urine STRAW (A) YELLOW   APPearance CLEAR CLEAR  Specific Gravity, Urine 1.004 (L) 1.005 - 1.030   pH 5.0 5.0 - 8.0   Glucose, UA NEGATIVE NEGATIVE mg/dL   Hgb urine dipstick NEGATIVE NEGATIVE   Bilirubin Urine NEGATIVE NEGATIVE   Ketones, ur NEGATIVE NEGATIVE mg/dL   Protein, ur NEGATIVE NEGATIVE mg/dL    Nitrite NEGATIVE NEGATIVE   Leukocytes,Ua NEGATIVE NEGATIVE  CBG monitoring, ED  Result Value Ref Range   Glucose-Capillary 108 (H) 70 - 99 mg/dL   DG Chest 2 View  Result Date: 05/02/2020 CLINICAL DATA:  Shortness of breath. Postop. EXAM: CHEST - 2 VIEW COMPARISON:  Chest x-ray dated 04/30/2020. FINDINGS: Heart size and mediastinal contours are stable. Median sternotomy wires appear intact and stable in alignment. Probable mild bibasilar atelectasis. Probable small bilateral pleural effusions. Lungs otherwise clear. No pneumothorax is seen. RIGHT IJ sheath has been removed in the interval. IMPRESSION: Probable mild bibasilar atelectasis and small bilateral pleural effusions. Electronically Signed   By: Franki Cabot M.D.   On: 05/02/2020 10:31   CARDIAC CATHETERIZATION  Result Date: 04/27/2020  Prox RCA lesion is 100% stenosed.  Prox Cx to Mid Cx lesion is 100% stenosed.  Ramus lesion is 90% stenosed.  Mid LAD lesion is 100% stenosed.  LV end diastolic pressure is moderately elevated.  1. 3 vessel occlusive CAD.    - 100% mid LAD after a large diagonal branch. Faint right to left collaterals.    - 90% ramus intermediate    - 100% CTO of the mid LCx with good left to left collaterals    - 100% CTO of the proximal RCA. Good right to right and left to right collaterals. 2. Difficult to assess LV function due to underfilling. Will assess with Echo 3. Moderately elevated LV filling pressures. Improved with IV lasix and IABP 4. Normal right heart pressures 5. Preserved cardiac output 4.4 liters/min Plan: transfer to ICU. Heparinize. IABP support. CT surgery consultation for CABG. Assess LV function with Echo. Continue diuresis. Trend troponin levels.   DG Chest Port 1 View  Result Date: 04/30/2020 CLINICAL DATA:  Recent CABG. EXAM: PORTABLE CHEST 1 VIEW COMPARISON:  Chest x-ray from yesterday. FINDINGS: Interval removal of the endotracheal tube, enteric tube, and Swan-Ganz catheter. The right  internal jugular sheath remains in place. Bilateral chest tubes and mediastinal drain are unchanged. Stable cardiomediastinal silhouette status post CABG. Persistent low lung volumes with coarsened interstitial markings. No focal consolidation, pleural effusion, or pneumothorax. No acute osseous abnormality. IMPRESSION: Interval extubation.  Persistent low lung volumes. Electronically Signed   By: Titus Dubin M.D.   On: 04/30/2020 08:33   DG Chest Port 1 View  Result Date: 04/29/2020 CLINICAL DATA:  79 year old male with a history CABG yesterday EXAM: PORTABLE CHEST 1 VIEW COMPARISON:  04/28/2020, 04/27/2020 FINDINGS: Cardiomediastinal silhouette unchanged in size and contour, accentuated by low lung volumes. Surgical changes of median sternotomy and CABG. Endotracheal tube terminates 5.4 cm above the carina. Gastric tube terminates within the left upper quadrant with the side port at the GE junction. Right IJ sheath transmits a Swan-Ganz catheter which terminates in the proximal pulmonary artery. Mediastinal/pleural drains are unchanged. Epicardial pacing leads are not visualized. Low lung volumes with coarsened interstitial markings. No pneumothorax. No pleural effusion. IMPRESSION: Early surgical changes of CABG, with low lung volumes and atelectasis. No visualized pneumothorax with unchanged mediastinal/pleural drains. Unchanged position of endotracheal tube, gastric tube, and right IJ sheath which transmits Swan-Ganz catheter. Electronically Signed   By: Corrie Mckusick D.O.   On:  04/29/2020 07:52   DG Chest Port 1 View  Result Date: 04/28/2020 CLINICAL DATA:  Post CABG EXAM: PORTABLE CHEST 1 VIEW COMPARISON:  04/27/2020 FINDINGS: Changes of CABG. NG tube tip is in the distal esophagus near the GE junction. Endotracheal tube is 5 cm above the carina. Bilateral chest tubes. No pneumothorax. Swan-Ganz catheter tip is in the main pulmonary artery. Bibasilar atelectasis. IMPRESSION: Postoperative changes.   Bibasilar atelectasis.  No pneumothorax. Electronically Signed   By: Charlett Nose M.D.   On: 04/28/2020 18:20   DG Chest Portable 1 View  Result Date: 04/27/2020 CLINICAL DATA:  Shortness of breath. EXAM: PORTABLE CHEST 1 VIEW COMPARISON:  May 31, 2017 FINDINGS: Moderate severity diffusely increased interstitial lung markings are seen, bilaterally. This represents a new finding when compared to the prior study. There is no evidence of a pleural effusion or pneumothorax. The heart size and mediastinal contours are within normal limits. Degenerative changes seen throughout the thoracic spine. 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 A superimposed component of acute interstitial edema cannot be excluded. Electronically Signed   By: Aram Candela M.D.   On: 04/27/2020 21:07   ECHOCARDIOGRAM COMPLETE  Result Date: 04/28/2020    ECHOCARDIOGRAM REPORT   Patient Name:   VIMAL DEREGO Date of Exam: 04/28/2020 Medical Rec #:  [id number]      Height:       70.0 in Accession #:    [id number]     Weight:       206.6 lb Date of Birth:  May 11, 1941      BSA:          2.116 m Patient Age:    79 years       BP:           164/105 mmHg Patient Gender: M              HR:           92 bpm. Exam Location:  Inpatient Procedure: 2D Echo Indications:    I21.3 STEMI  History:        Patient has prior history of Echocardiogram examinations, most                 recent 05/07/2017. Risk Factors:Hypertension and Dyslipidemia.  Sonographer:    Celene Skeen RDCS (AE) Referring Phys: [phone] AMANDA C CONIGLIO IMPRESSIONS  1. No intracavitary thrombus is seen. The dyskinetic anteroapical area is not thinned or hyperechoic, suggesting recent ischemia/infarction. Left ventricular ejection fraction, by estimation, is 30 to 35%. The left ventricle has moderate to severely decreased function. The left ventricle demonstrates regional wall motion abnormalities (see scoring diagram/findings for description). Left  ventricular diastolic parameters are consistent with Grade II diastolic dysfunction (pseudonormalization). Elevated left atrial pressure.  2. Right ventricular systolic function is normal. The right ventricular size is normal. A catheter or pacemaker wire is seen in the right ventricle. is visualized.  3. The mitral valve is normal in structure. No evidence of mitral valve regurgitation.  4. The aortic valve is normal in structure. Aortic valve regurgitation is not visualized. Comparison(s): Changes from prior study are noted. The left ventricular function is significantly worse. The left ventricular wall motion abnormalities are new. FINDINGS  Left Ventricle: No intracavitary thrombus is seen. The dyskinetic anteroapical area is not thinned or hyperechoic, suggesting recent ischemia/infarction. Left ventricular ejection fraction, by estimation, is 30 to 35%. The left ventricle has moderate to  severely decreased function. The left ventricle demonstrates regional  wall motion abnormalities. The left ventricular internal cavity size was normal in size. There is no left ventricular hypertrophy. Left ventricular diastolic parameters are consistent  with Grade II diastolic dysfunction (pseudonormalization). Elevated left atrial pressure.  LV Wall Scoring: The apical lateral segment, apical septal segment, apical anterior segment, and apex are dyskinetic. The mid anteroseptal segment, mid anterior segment, and apical inferior segment are akinetic. The antero-lateral wall, inferior wall, posterior wall, basal anteroseptal segment, mid inferoseptal segment, basal anterior segment, and basal inferoseptal segment are normal. Right Ventricle: The right ventricular size is normal. No increase in right ventricular wall thickness. Right ventricular systolic function is normal. Left Atrium: Left atrial size was normal in size. Right Atrium: Right atrial size was normal in size. Pericardium: There is no evidence of pericardial  effusion. Mitral Valve: The mitral valve is normal in structure. No evidence of mitral valve regurgitation. Tricuspid Valve: The tricuspid valve is normal in structure. Tricuspid valve regurgitation is not demonstrated. Aortic Valve: The aortic valve is normal in structure. Aortic valve regurgitation is not visualized. Pulmonic Valve: The pulmonic valve was grossly normal. Pulmonic valve regurgitation is not visualized. Aorta: The aortic root is normal in size and structure. IAS/Shunts: No atrial level shunt detected by color flow Doppler. Additional Comments: A catheter or pacemaker wire is seen in the right ventricle. is visualized.  LEFT VENTRICLE PLAX 2D LVIDd:         4.40 cm  Diastology LVIDs:         3.40 cm  LV e' lateral:   11.10 cm/s LV PW:         1.20 cm  LV E/e' lateral: 8.1 LV IVS:        0.90 cm LVOT diam:     2.20 cm LV SV:         47 LV SV Index:   22 LVOT Area:     3.80 cm  LEFT ATRIUM           Index LA diam:      3.40 cm 1.61 cm/m LA Vol (A2C): 58.6 ml 27.69 ml/m  AORTIC VALVE LVOT Vmax:   69.10 cm/s LVOT Vmean:  51.200 cm/s LVOT VTI:    0.124 m  AORTA Ao Root diam: 3.10 cm MITRAL VALVE MV Area (PHT): 6.27 cm    SHUNTS MV Decel Time: 121 msec    Systemic VTI:  0.12 m MV E velocity: 89.70 cm/s  Systemic Diam: 2.20 cm MV A velocity: 68.90 cm/s MV E/A ratio:  1.30 Mihai Croitoru MD Electronically signed by Thurmon Fair MD Signature Date/Time: 04/28/2020/10:53:22 AM    Final    ECHO INTRAOPERATIVE TEE  Result Date: 04/28/2020  *INTRAOPERATIVE TRANSESOPHAGEAL REPORT *  Patient Name:   EMILO GRAS Date of Exam: 04/28/2020 Medical Rec #:  [id number]      Height:       70.0 in Accession #:    [id number]     Weight:       206.6 lb Date of Birth:  1941/07/19      BSA:          2.12 m Patient Age:    79 years       BP:           165/105 mmHg Patient Gender: M              HR:           75 bpm. Exam Location:  Anesthesiology Transesophogeal exam was perform  intraoperatively during surgical  procedure. Patient was closely monitored under general anesthesia during the entirety of examination. Indications:     Coronary artery disease Sonographer:     Renella Cunas RDCS Performing Phys: [id number] Eliezer Lofts LIGHTFOOT Diagnosing Phys: Lewie Loron MD Complications: No known complications during this procedure. POST-OP IMPRESSIONS - Right Ventricle: The right ventricle appears unchanged from pre-bypass. - Aorta: The aorta appears unchanged from pre-bypass. - Left Atrium: The left atrium appears unchanged from pre-bypass. - Left Atrial Appendage: The left atrial appendage appears unchanged from pre-bypass. - Aortic Valve: The aortic valve appears unchanged from pre-bypass. - Mitral Valve: The mitral valve appears unchanged from pre-bypass. - Tricuspid Valve: The tricuspid valve appears unchanged from pre-bypass. - Interatrial Septum: The interatrial septum appears unchanged from pre-bypass. - Interventricular Septum: The interventricular septum appears unchanged from pre-bypass. - Pericardium: The pericardium appears unchanged from pre-bypass. PRE-OP FINDINGS  Left Ventricle: The left ventricle has mildly reduced systolic function, with an ejection fraction of 45-50%. The cavity size was moderately dilated. There is moderate concentric left ventricular hypertrophy. Right Ventricle: The right ventricle has normal systolic function. The cavity was mildly enlarged. There is no increase in right ventricular wall thickness. Left Atrium: Left atrial size was normal in size. The left atrial appendage is well visualized and there is no evidence of thrombus present. Right Atrium: Right atrial size was normal in size. Interatrial Septum: No atrial level shunt detected by color flow Doppler. Pericardium: There is no evidence of pericardial effusion. There is a small pleural effusion in the right lateral region. Mitral Valve: The mitral valve is normal in structure. Mild thickening of the mitral valve leaflet. Mitral valve  regurgitation is mild by color flow Doppler. There is no evidence of mitral valve vegetation. Tricuspid Valve: The tricuspid valve was normal in structure. Tricuspid valve regurgitation is mild by color flow Doppler. There is no evidence of tricuspid valve vegetation. Aortic Valve: The aortic valve is tricuspid There is mild thickening of the aortic valve Aortic valve regurgitation is trivial by color flow Doppler. There is no evidence of aortic valve stenosis. There is no evidence of a vegetation on the aortic valve. Pulmonic Valve: The pulmonic valve was normal in structure. Pulmonic valve regurgitation is trivial by color flow Doppler. Aorta: There is evidence of atheroma immobile plaque in the descending aorta; Grade III, measuring 3-7mm in size. +-------------+--------++ AORTIC VALVE          +-------------+--------++ AV Mean Grad:3.0 mmHg +-------------+--------++  Lewie Loron MD Electronically signed by Lewie Loron MD Signature Date/Time: 04/28/2020/4:53:34 PM    Final     EKG EKG Interpretation  Date/Time:  Monday May 04 2020 18:44:11 EDT Ventricular Rate:  85 PR Interval:    QRS Duration: 98 QT Interval:  371 QTC Calculation: 442 R Axis:   61 Text Interpretation: Sinus rhythm Multiple ventricular premature complexes Nonspecific ST abnormality No significant change since last tracing Confirmed by Cathren Laine ([id number]) on 05/04/2020 7:01:19 PM   Radiology No results found.  Procedures Procedures (including critical care time)  Medications Ordered in ED Medications  sodium chloride flush (NS) 0.9 % injection 3 mL (3 mLs Intravenous Not Given 05/04/20 1851)    ED Course  I have reviewed the triage vital signs and the nursing notes.  Pertinent labs & imaging results that were available during my care of the patient were reviewed by me and considered in my medical decision making (see chart for details).    MDM Rules/Calculators/A&P  Iv ns. Stat  labs. Continuous pulse ox and monitor.   Reviewed nursing notes and prior charts for additional history.   Labs reviewed/interpreted by me - hgb 8.5, c/w prior. k sl low. kcl po.  Po fluids/food.   Ambulate in hall - no faintness or dizziness.   On monitor, remains in sinus rhythm. Occasional pvc. No other dysrhythmia. No cp or sob. No fever or chills.   Pt continues to be in nsr. No faintness or dizziness. No fever, chills, or sweats. no cp. No sob. Feels fine, no new c/o.   Ambulates in hall, states feels fine. No fever/chills/swats. No cp or sob. No faintness.   Pt currently appears stable for d/c.   Rec pcp and CT f/u as arranged.   Return precautions provided.   Final Clinical Impression(s) / ED Diagnoses Final diagnoses:  None    Rx / DC Orders ED Discharge Orders    None           Cathren Laine, MD 05/04/20 2320

## 2020-05-05 ENCOUNTER — Telehealth (HOSPITAL_COMMUNITY): Payer: Self-pay

## 2020-05-05 NOTE — Telephone Encounter (Signed)
Pt insurance is active and benefits verified through [facility]. Co-pay $0.00, DED $1,000.00/$120.48 met, out of pocket $3,500.00/$127.44 met, co-insurance 20%. No pre-authorization required. Jason/[employer], 05/05/20 @ 1247PM, [phone]  Will contact patient to see if he is interested in the Cardiac Rehab Program. If interested, patient will need to complete follow up appt. Once completed, patient will be contacted for scheduling upon review by the RN Navigator.

## 2020-05-05 NOTE — Telephone Encounter (Signed)
Attempted to call patient in regards to Cardiac Rehab - unable to leave VM, VM full at the time.

## 2020-05-07 ENCOUNTER — Ambulatory Visit (INDEPENDENT_AMBULATORY_CARE_PROVIDER_SITE_OTHER): Payer: Self-pay | Admitting: Thoracic Surgery (Cardiothoracic Vascular Surgery)

## 2020-05-07 ENCOUNTER — Other Ambulatory Visit: Payer: Self-pay

## 2020-05-07 ENCOUNTER — Encounter: Payer: Self-pay | Admitting: Thoracic Surgery (Cardiothoracic Vascular Surgery)

## 2020-05-07 ENCOUNTER — Telehealth: Payer: Self-pay

## 2020-05-07 VITALS — BP 130/78 | HR 82 | Temp 97.7°F | Resp 20 | Ht 70.0 in | Wt 209.0 lb

## 2020-05-07 DIAGNOSIS — Z951 Presence of aortocoronary bypass graft: Secondary | ICD-10-CM

## 2020-05-07 DIAGNOSIS — I2510 Atherosclerotic heart disease of native coronary artery without angina pectoris: Secondary | ICD-10-CM

## 2020-05-07 NOTE — Telephone Encounter (Signed)
Patient's wife, Joshua Mack contacted the office Monday 05/04/20 concerned about her husband.  She stated that her kids were in the room with her husband when they think he had an episode that they described as a seizure.  She stated that family said he was starring in "space" and began making jerking movements which eventually stopped.  She stated that she was not in the home when it happened so she was not even sure he had a seizure.  Patient has no recollection of this happening.  Patient stated over the phone that he felt fine.  She called to ask what they should do.  Patient's wife was advised to contact 911 or go to nearest emergency room to get him checked out.  She was not sure he even needed to go to the emergency room.  Advised to check patient's vital signs if they were able to and to check his blood glucose if they had a machine to do so. She acknowledged receipt.    Patient does have a follow-up appointment with Dr. Cliffton Asters today, 05/07/20.

## 2020-05-07 NOTE — Progress Notes (Signed)
      [address]       [place] [address]             [id number]        Joshua Mack [facility] Medical Record #[id number] Date of Birth: 1941/09/27  Referring: Nicoletta Ba, MD Primary Care: Johny Blamer, MD Primary Cardiologist:No primary care provider on file.  Reason for visit:   follow-up  History of Present Illness:     Joshua Mack comes in for his first follow-up.  Overall he is done well, his wife to call 911 a few days ago for questionable seizure activity.  Evaluation in the emergency department, nothing was discovered.  Outside of this he states that he has had no major issues.  Physical Exam: BP 130/78 (BP Location: Right Arm, Patient Position: Sitting, Cuff Size: Normal)   Pulse 82   Temp 97.7 F (36.5 C) (Temporal)   Resp 20   Ht 5\' 10"  (1.778 m)   Wt 209 lb (94.8 kg)   SpO2 94% Comment: RA  BMI 29.99 kg/m   Alert NAD Incision clean.  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  Doing well. We will follow up in 1 month with a chest x-ray.   70 05/07/2020 10:21 AM

## 2020-05-08 ENCOUNTER — Emergency Department (HOSPITAL_COMMUNITY): Payer: Commercial Managed Care - PPO

## 2020-05-08 ENCOUNTER — Other Ambulatory Visit: Payer: Self-pay

## 2020-05-08 ENCOUNTER — Encounter (HOSPITAL_COMMUNITY): Payer: Self-pay | Admitting: Emergency Medicine

## 2020-05-08 ENCOUNTER — Inpatient Hospital Stay (HOSPITAL_COMMUNITY)
Admission: EM | Admit: 2020-05-08 | Discharge: 2020-05-13 | DRG: 314 | Disposition: A | Discharge status: Home / Self Care | Payer: Commercial Managed Care - PPO | Attending: Cardiothoracic Surgery | Admitting: Cardiothoracic Surgery

## 2020-05-08 ENCOUNTER — Telehealth: Payer: Self-pay

## 2020-05-08 DIAGNOSIS — I2510 Atherosclerotic heart disease of native coronary artery without angina pectoris: Secondary | ICD-10-CM | POA: Diagnosis present

## 2020-05-08 DIAGNOSIS — Z9889 Other specified postprocedural states: Secondary | ICD-10-CM

## 2020-05-08 DIAGNOSIS — Z79899 Other long term (current) drug therapy: Secondary | ICD-10-CM

## 2020-05-08 DIAGNOSIS — I959 Hypotension, unspecified: Secondary | ICD-10-CM | POA: Diagnosis not present

## 2020-05-08 DIAGNOSIS — I110 Hypertensive heart disease with heart failure: Secondary | ICD-10-CM | POA: Diagnosis present

## 2020-05-08 DIAGNOSIS — I454 Nonspecific intraventricular block: Secondary | ICD-10-CM | POA: Diagnosis present

## 2020-05-08 DIAGNOSIS — D72829 Elevated white blood cell count, unspecified: Secondary | ICD-10-CM | POA: Diagnosis present

## 2020-05-08 DIAGNOSIS — R05 Cough: Secondary | ICD-10-CM | POA: Diagnosis present

## 2020-05-08 DIAGNOSIS — J90 Pleural effusion, not elsewhere classified: Secondary | ICD-10-CM | POA: Diagnosis present

## 2020-05-08 DIAGNOSIS — I97190 Other postprocedural cardiac functional disturbances following cardiac surgery: Secondary | ICD-10-CM | POA: Diagnosis not present

## 2020-05-08 DIAGNOSIS — T8130XA Disruption of wound, unspecified, initial encounter: Secondary | ICD-10-CM

## 2020-05-08 DIAGNOSIS — E7800 Pure hypercholesterolemia, unspecified: Secondary | ICD-10-CM | POA: Diagnosis present

## 2020-05-08 DIAGNOSIS — K5900 Constipation, unspecified: Secondary | ICD-10-CM | POA: Diagnosis present

## 2020-05-08 DIAGNOSIS — Z9689 Presence of other specified functional implants: Secondary | ICD-10-CM

## 2020-05-08 DIAGNOSIS — J9811 Atelectasis: Secondary | ICD-10-CM | POA: Diagnosis present

## 2020-05-08 DIAGNOSIS — Z951 Presence of aortocoronary bypass graft: Secondary | ICD-10-CM

## 2020-05-08 DIAGNOSIS — Z20822 Contact with and (suspected) exposure to covid-19: Secondary | ICD-10-CM | POA: Diagnosis present

## 2020-05-08 DIAGNOSIS — J939 Pneumothorax, unspecified: Secondary | ICD-10-CM

## 2020-05-08 DIAGNOSIS — I5041 Acute combined systolic (congestive) and diastolic (congestive) heart failure: Secondary | ICD-10-CM | POA: Diagnosis present

## 2020-05-08 DIAGNOSIS — M96842 Postprocedural seroma of a musculoskeletal structure following a musculoskeletal system procedure: Secondary | ICD-10-CM

## 2020-05-08 HISTORY — DX: Other complications of anesthesia, initial encounter: T88.59XA

## 2020-05-08 LAB — CBC WITH DIFFERENTIAL/PLATELET
Abs Immature Granulocytes: 0.16 10*3/uL — ABNORMAL HIGH (ref 0.00–0.07)
Basophils Absolute: 0.1 10*3/uL (ref 0.0–0.1)
Basophils Relative: 1 %
Eosinophils Absolute: 0.4 10*3/uL (ref 0.0–0.5)
Eosinophils Relative: 3 %
HCT: 31.2 % — ABNORMAL LOW (ref 39.0–52.0)
Hemoglobin: 9.8 g/dL — ABNORMAL LOW (ref 13.0–17.0)
Immature Granulocytes: 1 %
Lymphocytes Relative: 11 %
Lymphs Abs: 1.5 10*3/uL (ref 0.7–4.0)
MCH: 29.8 pg (ref 26.0–34.0)
MCHC: 31.4 g/dL (ref 30.0–36.0)
MCV: 94.8 fL (ref 80.0–100.0)
Monocytes Absolute: 1.1 10*3/uL — ABNORMAL HIGH (ref 0.1–1.0)
Monocytes Relative: 8 %
Neutro Abs: 10.7 10*3/uL — ABNORMAL HIGH (ref 1.7–7.7)
Neutrophils Relative %: 76 %
Platelets: 873 10*3/uL — ABNORMAL HIGH (ref 150–400)
RBC: 3.29 MIL/uL — ABNORMAL LOW (ref 4.22–5.81)
RDW: 13.7 % (ref 11.5–15.5)
WBC: 13.9 10*3/uL — ABNORMAL HIGH (ref 4.0–10.5)
nRBC: 0.1 % (ref 0.0–0.2)

## 2020-05-08 LAB — BASIC METABOLIC PANEL
Anion gap: 13 (ref 5–15)
BUN: 12 mg/dL (ref 8–23)
CO2: 23 mmol/L (ref 22–32)
Calcium: 8.3 mg/dL — ABNORMAL LOW (ref 8.9–10.3)
Chloride: 100 mmol/L (ref 98–111)
Creatinine, Ser: 1.13 mg/dL (ref 0.61–1.24)
GFR calc Af Amer: >60 mL/min (ref >60)
GFR calc non Af Amer: >60 mL/min (ref >60)
Glucose, Bld: 113 mg/dL — ABNORMAL HIGH (ref 70–99)
Potassium: 3.9 mmol/L (ref 3.5–5.1)
Sodium: 136 mmol/L (ref 135–145)

## 2020-05-08 NOTE — Telephone Encounter (Signed)
FMLA form completed and Faxed to [employer], [phone] per pt RTW date 08/06/2020/ original form mailed to patient's home address.

## 2020-05-08 NOTE — ED Triage Notes (Signed)
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

## 2020-05-08 NOTE — ED Notes (Addendum)
Dressing on wound changed 4 times in triage, multiple 4x4 gauze saturated and leaking.

## 2020-05-09 ENCOUNTER — Encounter (HOSPITAL_COMMUNITY): Payer: Self-pay | Admitting: Emergency Medicine

## 2020-05-09 ENCOUNTER — Inpatient Hospital Stay (HOSPITAL_COMMUNITY): Payer: Commercial Managed Care - PPO

## 2020-05-09 ENCOUNTER — Emergency Department (HOSPITAL_COMMUNITY): Payer: Commercial Managed Care - PPO

## 2020-05-09 DIAGNOSIS — I959 Hypotension, unspecified: Secondary | ICD-10-CM | POA: Diagnosis not present

## 2020-05-09 DIAGNOSIS — R05 Cough: Secondary | ICD-10-CM | POA: Diagnosis present

## 2020-05-09 DIAGNOSIS — D72829 Elevated white blood cell count, unspecified: Secondary | ICD-10-CM | POA: Diagnosis present

## 2020-05-09 DIAGNOSIS — I2510 Atherosclerotic heart disease of native coronary artery without angina pectoris: Secondary | ICD-10-CM | POA: Diagnosis present

## 2020-05-09 DIAGNOSIS — Z951 Presence of aortocoronary bypass graft: Secondary | ICD-10-CM

## 2020-05-09 DIAGNOSIS — I5041 Acute combined systolic (congestive) and diastolic (congestive) heart failure: Secondary | ICD-10-CM | POA: Diagnosis present

## 2020-05-09 DIAGNOSIS — E7800 Pure hypercholesterolemia, unspecified: Secondary | ICD-10-CM | POA: Diagnosis present

## 2020-05-09 DIAGNOSIS — I97190 Other postprocedural cardiac functional disturbances following cardiac surgery: Secondary | ICD-10-CM | POA: Diagnosis present

## 2020-05-09 DIAGNOSIS — J9811 Atelectasis: Secondary | ICD-10-CM | POA: Diagnosis present

## 2020-05-09 DIAGNOSIS — J90 Pleural effusion, not elsewhere classified: Secondary | ICD-10-CM | POA: Diagnosis present

## 2020-05-09 DIAGNOSIS — I110 Hypertensive heart disease with heart failure: Secondary | ICD-10-CM | POA: Diagnosis present

## 2020-05-09 DIAGNOSIS — Z79899 Other long term (current) drug therapy: Secondary | ICD-10-CM | POA: Diagnosis not present

## 2020-05-09 DIAGNOSIS — K5900 Constipation, unspecified: Secondary | ICD-10-CM | POA: Diagnosis present

## 2020-05-09 DIAGNOSIS — I454 Nonspecific intraventricular block: Secondary | ICD-10-CM | POA: Diagnosis present

## 2020-05-09 DIAGNOSIS — Z20822 Contact with and (suspected) exposure to covid-19: Secondary | ICD-10-CM | POA: Diagnosis present

## 2020-05-09 HISTORY — PX: CHEST TUBE INSERTION: SHX231

## 2020-05-09 LAB — ECHOCARDIOGRAM LIMITED
Height: 70 in
Weight: 3703.73 oz

## 2020-05-09 LAB — CBC
HCT: 34.7 % — ABNORMAL LOW (ref 39.0–52.0)
Hemoglobin: 10.9 g/dL — ABNORMAL LOW (ref 13.0–17.0)
MCH: 29.8 pg (ref 26.0–34.0)
MCHC: 31.4 g/dL (ref 30.0–36.0)
MCV: 94.8 fL (ref 80.0–100.0)
Platelets: 1008 10*3/uL (ref 150–400)
RBC: 3.66 MIL/uL — ABNORMAL LOW (ref 4.22–5.81)
RDW: 13.8 % (ref 11.5–15.5)
WBC: 16.8 10*3/uL — ABNORMAL HIGH (ref 4.0–10.5)
nRBC: 0 % (ref 0.0–0.2)

## 2020-05-09 LAB — CREATININE, SERUM
Creatinine, Ser: 1.06 mg/dL (ref 0.61–1.24)
GFR calc Af Amer: >60 mL/min (ref >60)
GFR calc non Af Amer: >60 mL/min (ref >60)

## 2020-05-09 LAB — SARS CORONAVIRUS 2 BY RT PCR (HOSPITAL ORDER, PERFORMED IN ~~LOC~~ HOSPITAL LAB): SARS Coronavirus 2: NEGATIVE

## 2020-05-09 LAB — URINALYSIS, ROUTINE W REFLEX MICROSCOPIC
Bilirubin Urine: NEGATIVE
Glucose, UA: 50 mg/dL — AB
Ketones, ur: NEGATIVE mg/dL
Leukocytes,Ua: NEGATIVE
Nitrite: NEGATIVE
Protein, ur: >=300 mg/dL — AB
RBC / HPF: >50 RBC/hpf — ABNORMAL HIGH (ref 0–5)
Specific Gravity, Urine: 1.007 (ref 1.005–1.030)
pH: 7 (ref 5.0–8.0)

## 2020-05-09 MED ORDER — SODIUM CHLORIDE 0.9% FLUSH: 3.0000 mL | INTRAVENOUS | Status: DC | PRN

## 2020-05-09 MED ORDER — HEPARIN SODIUM (PORCINE) 5000 UNIT/ML IJ SOLN
5000.0000 [IU] | Freq: Three times a day (TID) | INTRAMUSCULAR | Status: DC
  Administered 2020-05-09 – 2020-05-13 (×12): 5000 [IU] via SUBCUTANEOUS
  Filled 2020-05-09 (×12): qty 1

## 2020-05-09 MED ORDER — ONDANSETRON HCL 4 MG/2ML IJ SOLN: 4.0000 mg | Freq: Four times a day (QID) | INTRAMUSCULAR | Status: DC | PRN

## 2020-05-09 MED ORDER — DIAZEPAM 2 MG PO TABS: 2.0000 mg | ORAL_TABLET | Freq: Four times a day (QID) | ORAL | Status: DC | PRN

## 2020-05-09 MED ORDER — MORPHINE SULFATE (PF) 4 MG/ML IV SOLN
4.0000 mg | Freq: Once | INTRAVENOUS | Status: AC
  Administered 2020-05-09: 4 mg via INTRAVENOUS
  Filled 2020-05-09: qty 1

## 2020-05-09 MED ORDER — LISINOPRIL 5 MG PO TABS
5.0000 mg | ORAL_TABLET | Freq: Every day | ORAL | Status: DC
  Administered 2020-05-10: 5 mg via ORAL
  Filled 2020-05-09 (×2): qty 1

## 2020-05-09 MED ORDER — KETOROLAC TROMETHAMINE 15 MG/ML IJ SOLN
15.0000 mg | Freq: Once | INTRAMUSCULAR | Status: AC
  Administered 2020-05-09: 15 mg via INTRAVENOUS
  Filled 2020-05-09: qty 1

## 2020-05-09 MED ORDER — DIPHENHYDRAMINE HCL 50 MG/ML IJ SOLN: 12.5000 mg | Freq: Four times a day (QID) | INTRAMUSCULAR | Status: DC | PRN

## 2020-05-09 MED ORDER — ASPIRIN 325 MG PO TABS
325.0000 mg | ORAL_TABLET | Freq: Every day | ORAL | Status: DC
  Administered 2020-05-09 – 2020-05-13 (×5): 325 mg via ORAL
  Filled 2020-05-09 (×5): qty 1

## 2020-05-09 MED ORDER — ATORVASTATIN CALCIUM 40 MG PO TABS
40.0000 mg | ORAL_TABLET | Freq: Every day | ORAL | Status: DC
  Administered 2020-05-10 – 2020-05-13 (×4): 40 mg via ORAL
  Filled 2020-05-09 (×5): qty 1

## 2020-05-09 MED ORDER — MORPHINE SULFATE 2 MG/ML IV SOLN
INTRAVENOUS | Status: DC
  Administered 2020-05-10: 1.5 mg via INTRAVENOUS
  Administered 2020-05-10: 6 mg via INTRAVENOUS
  Administered 2020-05-10: 3 mg via INTRAVENOUS
  Administered 2020-05-10: 1.5 mg via INTRAVENOUS
  Administered 2020-05-10: 6 mg via INTRAVENOUS
  Administered 2020-05-11 – 2020-05-12 (×8): 0 mg via INTRAVENOUS
  Filled 2020-05-09: qty 5

## 2020-05-09 MED ORDER — POTASSIUM CHLORIDE CRYS ER 20 MEQ PO TBCR
20.0000 meq | EXTENDED_RELEASE_TABLET | Freq: Two times a day (BID) | ORAL | Status: DC
  Administered 2020-05-09 – 2020-05-11 (×5): 20 meq via ORAL
  Filled 2020-05-09 (×5): qty 1

## 2020-05-09 MED ORDER — FUROSEMIDE 10 MG/ML IJ SOLN
40.0000 mg | Freq: Two times a day (BID) | INTRAMUSCULAR | Status: DC
  Administered 2020-05-10 – 2020-05-11 (×4): 40 mg via INTRAVENOUS
  Filled 2020-05-09 (×4): qty 4

## 2020-05-09 MED ORDER — PANTOPRAZOLE SODIUM 20 MG PO TBEC
20.0000 mg | DELAYED_RELEASE_TABLET | Freq: Two times a day (BID) | ORAL | Status: DC
  Administered 2020-05-09 – 2020-05-13 (×8): 20 mg via ORAL
  Filled 2020-05-09 (×8): qty 1

## 2020-05-09 MED ORDER — ACETAMINOPHEN 325 MG PO TABS
650.0000 mg | ORAL_TABLET | ORAL | Status: DC | PRN
  Administered 2020-05-09: 650 mg via ORAL
  Filled 2020-05-09: qty 2

## 2020-05-09 MED ORDER — IBUPROFEN 400 MG PO TABS
400.0000 mg | ORAL_TABLET | Freq: Three times a day (TID) | ORAL | Status: DC
  Administered 2020-05-10 – 2020-05-13 (×10): 400 mg via ORAL
  Filled 2020-05-09 (×10): qty 1

## 2020-05-09 MED ORDER — LIDOCAINE HCL (PF) 1 % IJ SOLN
INTRAMUSCULAR | Status: AC
  Filled 2020-05-09: qty 5

## 2020-05-09 MED ORDER — ASPIRIN EC 81 MG PO TBEC
81.0000 mg | DELAYED_RELEASE_TABLET | Freq: Every day | ORAL | Status: DC
  Administered 2020-05-09: 81 mg via ORAL
  Filled 2020-05-09: qty 1

## 2020-05-09 MED ORDER — NALOXONE HCL 0.4 MG/ML IJ SOLN: 0.4000 mg | INTRAMUSCULAR | Status: DC | PRN

## 2020-05-09 MED ORDER — IOHEXOL 350 MG/ML SOLN
80.0000 mL | Freq: Once | INTRAVENOUS | Status: AC | PRN
  Administered 2020-05-09: 80 mL via INTRAVENOUS

## 2020-05-09 MED ORDER — PERFLUTREN LIPID MICROSPHERE
INTRAVENOUS | Status: AC
  Filled 2020-05-09: qty 10

## 2020-05-09 MED ORDER — DIPHENHYDRAMINE HCL 12.5 MG/5ML PO ELIX
12.5000 mg | ORAL_SOLUTION | Freq: Four times a day (QID) | ORAL | Status: DC | PRN
  Filled 2020-05-09: qty 5

## 2020-05-09 MED ORDER — CLOPIDOGREL BISULFATE 75 MG PO TABS
75.0000 mg | ORAL_TABLET | Freq: Every day | ORAL | Status: DC
  Administered 2020-05-10 – 2020-05-13 (×4): 75 mg via ORAL
  Filled 2020-05-09 (×4): qty 1

## 2020-05-09 MED ORDER — SODIUM CHLORIDE 0.9% FLUSH: 9.0000 mL | INTRAVENOUS | Status: DC | PRN

## 2020-05-09 MED ORDER — METOPROLOL TARTRATE 25 MG PO TABS
25.0000 mg | ORAL_TABLET | Freq: Two times a day (BID) | ORAL | Status: DC
  Administered 2020-05-10 – 2020-05-13 (×8): 25 mg via ORAL
  Filled 2020-05-09 (×9): qty 1

## 2020-05-09 MED ORDER — SODIUM CHLORIDE 0.9 % IV SOLN
250.0000 mL | INTRAVENOUS | Status: DC | PRN
  Administered 2020-05-12: 250 mL via INTRAVENOUS

## 2020-05-09 MED ORDER — SODIUM CHLORIDE 0.9% FLUSH
3.0000 mL | Freq: Two times a day (BID) | INTRAVENOUS | Status: DC
  Administered 2020-05-09 – 2020-05-13 (×6): 3 mL via INTRAVENOUS

## 2020-05-09 MED ORDER — COLCHICINE 0.3 MG HALF TABLET
0.3000 mg | ORAL_TABLET | Freq: Two times a day (BID) | ORAL | Status: DC
  Administered 2020-05-09 – 2020-05-10 (×2): 0.3 mg via ORAL
  Filled 2020-05-09 (×2): qty 1

## 2020-05-09 MED ORDER — LIDOCAINE HCL (PF) 2 % IJ SOLN
10.0000 mL | Freq: Once | INTRAMUSCULAR | Status: AC
  Administered 2020-05-09: 10 mL
  Filled 2020-05-09 (×2): qty 10

## 2020-05-09 MED ORDER — LIDOCAINE HCL (PF) 1 % IJ SOLN
INTRAMUSCULAR | Status: AC
  Administered 2020-05-09: 5 mL
  Filled 2020-05-09: qty 5

## 2020-05-09 MED ORDER — MORPHINE SULFATE (PF) 2 MG/ML IV SOLN
1.0000 mg | INTRAVENOUS | Status: DC | PRN
  Administered 2020-05-09 (×4): 1 mg via INTRAVENOUS
  Filled 2020-05-09 (×5): qty 1

## 2020-05-09 MED ORDER — MORPHINE SULFATE 2 MG/ML IV SOLN: INTRAVENOUS | Status: DC

## 2020-05-09 MED ORDER — IBUPROFEN 400 MG PO TABS
400.0000 mg | ORAL_TABLET | Freq: Three times a day (TID) | ORAL | Status: DC
  Filled 2020-05-09: qty 1

## 2020-05-09 NOTE — Progress Notes (Signed)
1400 Pt admitted via stretcher from the ED to room 4E13. Pt A&Ox4, skin assessed, dressing applied to sternal wound. Pt oriented to room. Pt's wife came and and asked about IR procedure today. Dr. Vickey Sages paged.   1415 Per Dr. Vickey Sages, RN needs to call IR about drain placement. RN called IR and no procedures on weekends, unless emergent. Pt and wife informed. Pt sitting on side of bed eating lunch tray (clear liquid diet). Pt denies pain, CP, WCTM.

## 2020-05-09 NOTE — Progress Notes (Addendum)
1705 Bedside procedure with Dr. Everardo Pacific, RN for a chest tube. Pt A&Ox4, time out performed, see flow sheet, consents completed on chart. Pt's VSS WNL. EKG is ST.   1740 Procedure completed, [place] with sanguineous drainage present, total. Pt tolerated fair. BP lower than, but WNL. WCTM.   1920 Pt in excoriating pain from chest tube. Pt crawling out of bed in pain. Pt repositioned in bed. Pt assisted up in chair. Morphine given, WCTM.   1935 Pt still in pain, Dr. Vickey Sages paged, new orders received. Awaiting pharmacy to approve meds. Bedside shift given.

## 2020-05-09 NOTE — ED Notes (Signed)
Do not page IR until 05/10/2020 @ 7am per Dr Karel Jarvis

## 2020-05-09 NOTE — Procedures (Signed)
Chest Tube Insertion Procedure Note  Indications:  Clinically significant Effusion; heart failure  Pre-operative Diagnosis: Effusion, left  Post-operative Diagnosis: same  Procedure Details  Informed consent was obtained for the procedure, including sedation.  Risks of lung perforation, hemorrhage, arrhythmia, and adverse drug reaction were discussed.   After sterile skin prep, using standard technique, a 28 French tube was placed in the left anterior 6th rib space.  Findings: 1,300 mL serosanguinous fluid evacuated initially  Estimated Blood Loss:  Minimal         Specimens:  None              Complications:  None; patient tolerated the procedure well.         Disposition: cxr pending;          Condition: stable  Attending Attestation: I was present and scrubbed for the entire procedure.

## 2020-05-09 NOTE — Progress Notes (Signed)
  Echocardiogram 2D Echocardiogram has been performed.  Celene Skeen 05/09/2020, 3:50 PM

## 2020-05-09 NOTE — Progress Notes (Signed)
CRITICAL VALUE ALERT  Critical Value:  Platelets 1,008 K/ul  Date & Time Notied: 05/09/20 1521  Provider Notified: DR. Vickey Sages  Orders Received/Actions taken: .Marland KitchenMarland Kitchen

## 2020-05-09 NOTE — ED Provider Notes (Signed)
[facility] EMERGENCY DEPARTMENT Provider Note   CSN: [id number] Arrival date & time: 05/08/20  2211     History Chief Complaint  Patient presents with  . Post-op Problem    CABG    Joshua Mack is a 79 y.o. male.  The history is provided by the patient and the spouse.  Illness Location:  Sternum at site of sternotomy from 04/28/20  Quality:  Drainage of serosanguinous material  Severity:  Moderate Onset quality:  Sudden Timing:  Constant Progression:  Partially resolved Chronicity:  New Context:  Post op day 9 from CABG Relieved by:  Nothing Worsened by:  Coughing which is what started the leakage  Ineffective treatments:  None tried  Associated symptoms: no abdominal pain, no chest pain, no congestion, no cough, no diarrhea, no ear pain, no fatigue, no fever, no headaches, no loss of consciousness, no myalgias, no nausea, no rash, no rhinorrhea, no shortness of breath, no sore throat, no vomiting and no wheezing   Risk factors:  Post operative  POD 9 from CABG with fluid leakage post cough.  Family contacted Dr. Vickey Sages of [facility] regarding this.  No f/c/r.  No CP no SOB.       Past Medical History:  Diagnosis Date  . Coronary artery disease   . High cholesterol   . Hypertension   . Sleep apnea     Patient Active Problem List   Diagnosis Date Noted  . Multiple vessel coronary artery disease: Acute mid LAD 100% occlusion, 90% RI and CTO of LCx and RCA 04/28/2020  . Acute combined systolic and diastolic heart failure (HCC) 04/28/2020  . 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 Acute ST elevation myocardial infarction (STEMI) of anterolateral wall (HCC)-likely delayed presentation 04/27/2020  . Essential hypertension 04/27/2020  . DVT (deep venous thrombosis) (HCC) 05/08/2017  . OSA (obstructive sleep apnea) 05/08/2017  . Hyponatremia 05/07/2017  . Respiratory failure with hypoxia (HCC) 05/07/2017  . Spinal stenosis at L4-L5 level 05/03/2017    Past Surgical  History:  Procedure Laterality Date  . CARDIAC SURGERY    . CORONARY ARTERY BYPASS GRAFT N/A 04/28/2020   Procedure: CORONARY ARTERY BYPASS GRAFTING (CABG) times four using LIMA to LAD; bilateral endoscopic greater saphenous vein harvest: SVG to Circ; SVG to RAMUS; and SVG to PDA.;  Surgeon: Corliss Skains, MD;  Location: [facility] OR;  Service: Open Heart Surgery;  Laterality: N/A;  . ENDOVEIN HARVEST OF GREATER SAPHENOUS VEIN Bilateral 04/28/2020   Procedure: [name];  Surgeon: Corliss Skains, MD;  Location: [facility] OR;  Service: Open Heart Surgery;  Laterality: Bilateral;  . IABP INSERTION N/A 04/27/2020   Procedure: IABP Insertion;  Surgeon: Swaziland, Peter M, MD;  Location: [facility] INVASIVE CV LAB;  Service: Cardiovascular;  Laterality: N/A;  . LUMBAR LAMINECTOMY/DECOMPRESSION MICRODISCECTOMY N/A 05/03/2017   Procedure: Microlumbar decompression L4-5, L3-4,  L5-S1 WITH LATERAL AUTOGRAFT FUSION;  Surgeon: Jene Every, MD;  Location: [facility] ORS;  Service: Orthopedics;  Laterality: N/A;  . RIGHT/LEFT HEART CATH AND CORONARY ANGIOGRAPHY N/A 04/27/2020   Procedure: RIGHT/LEFT HEART CATH AND CORONARY ANGIOGRAPHY;  Surgeon: Swaziland, Peter M, MD;  Location: [facility] INVASIVE CV LAB;  Service: Cardiovascular;  Laterality: N/A;       History reviewed. No pertinent family history.  Social History   Tobacco Use  . Smoking status: Never Smoker  . Smokeless tobacco: Never Used  Substance Use Topics  . Alcohol use: No  . Drug use: No  Home Medications Prior to Admission medications   Medication Sig Start Date End Date Taking? Authorizing Provider  acetaminophen (TYLENOL) 500 MG tablet Take 500-1,000 mg by mouth 2 (two) times daily as needed for mild pain or headache.     [provider]  aspirin EC 81 MG EC tablet Take 1 tablet (81 mg total) by mouth daily. 05/03/20   Barrett, Erin R, PA-C  atorvastatin (LIPITOR) 40 MG tablet Take 1 tablet (40 mg total) by mouth  daily. 05/03/20   Barrett, Erin R, PA-C  Bioflavonoid Products (ESTER C PO) Take 500 mg by mouth daily.    [provider]  Chromium 1000 MCG TABS Take 1,000 mcg by mouth daily.    [provider]  clopidogrel (PLAVIX) 75 MG tablet Take 1 tablet (75 mg total) by mouth daily. 05/03/20   Barrett, Rae RoamErin R, PA-C  dextromethorphan-guaiFENesin (MUCINEX DM) 30-600 MG 12hr tablet Take 1 tablet by mouth 2 (two) times daily as needed for cough. 05/02/20   Barrett, Erin R, PA-C  docusate sodium (COLACE) 100 MG capsule Take 1 capsule (100 mg total) by mouth 2 (two) times daily as needed for mild constipation. Patient not taking: Reported on 05/07/2020 05/03/17   Jene EveryBeane, Jeffrey, MD  Garlic 1000 MG CAPS Take 1,000 mg by mouth daily.     [provider]  Chilton SiGreen Tea 250 MG CAPS Take 250 mg by mouth at bedtime.     [provider]  Boris LownKrill Oil 1000 MG CAPS Take 1,000 mg by mouth daily.    [provider]  L-Arginine 500 MG CAPS Take 500 mg by mouth daily.    [provider]  Melatonin 5 MG TABS Take 5 mg by mouth at bedtime.    [provider]  metoprolol tartrate (LOPRESSOR) 25 MG tablet Take 1 tablet (25 mg total) by mouth 2 (two) times daily. 05/03/20   Barrett, Rae RoamErin R, PA-C  Misc Natural Products (OSTEO BI-FLEX JOINT SHIELD) TABS Take 1 tablet by mouth daily.    [provider]  Misc Natural Products [facility](URINOZINC PROSTATE) CAPS Take 1 capsule by mouth daily.    [provider]  Multiple Vitamins-Minerals (CENTRUM SILVER 50+MEN) TABS Take 1 tablet by mouth daily with breakfast.    [provider]  polyethylene glycol (MIRALAX / GLYCOLAX) packet Take 17 g by mouth daily. 05/03/17   Jene EveryBeane, Jeffrey, MD  potassium chloride SA (KLOR-CON) 20 MEQ tablet Take 1 tablet (20 mEq total) by mouth daily. 05/04/20   Cathren LaineSteinl, Kevin, MD  PRESCRIPTION MEDICATION CPAP- At bedtime and during times of rest    [provider]  Resveratrol 250 MG CAPS  Take 250 mg by mouth daily.    [provider]  traMADol (ULTRAM) 50 MG tablet Take 1 tablet (50 mg total) by mouth every 4 (four) hours as needed for moderate pain. 05/03/20   Barrett, Rae RoamErin R, PA-C  Turmeric Curcumin 500 MG CAPS Take 500 mg by mouth daily.     [provider]    Allergies    Clemastine, Fentanyl, Midazolam, Phenylpropanolamine, Amoxicillin, Other, and Tavist nd [loratadine]  Review of Systems   Review of Systems  Constitutional: Negative for fatigue and fever.  HENT: Negative for congestion, ear pain, rhinorrhea and sore throat.   Eyes: Negative for visual disturbance.  Respiratory: Negative for cough, shortness of breath and wheezing.   Cardiovascular: Negative for chest pain.  Gastrointestinal: Negative for abdominal pain, diarrhea, nausea and vomiting.  Genitourinary: Negative for difficulty  urinating.  Musculoskeletal: Negative for myalgias.  Skin: Positive for wound. Negative for rash.  Neurological: Negative for loss of consciousness and headaches.  Psychiatric/Behavioral: Negative for agitation.  All other systems reviewed and are negative.   Physical Exam Updated Vital Signs BP 133/77   Pulse 98   Temp 99.7 F (37.6 C) (Oral)   Resp 19   Ht 5\' 10"  (1.778 m)   Wt 105 kg   SpO2 92%   BMI 33.21 kg/m   Physical Exam Vitals and nursing note reviewed.  Constitutional:      General: He is not in acute distress.    Appearance: Normal appearance.  HENT:     Head: Normocephalic and atraumatic.     Nose: Nose normal.  Eyes:     Extraocular Movements: Extraocular movements intact.     Pupils: Pupils are equal, round, and reactive to light.  Cardiovascular:     Rate and Rhythm: Normal rate and regular rhythm.     Pulses: Normal pulses.     Heart sounds: Normal heart sounds.  Pulmonary:     Effort: Pulmonary effort is normal.     Breath sounds: Normal breath sounds.  Chest:    Abdominal:     General: Abdomen is flat. Bowel sounds  are normal.     Tenderness: There is no abdominal tenderness.  Musculoskeletal:        General: Normal range of motion.     Cervical back: Normal range of motion and neck supple.  Skin:    General: Skin is warm and dry.     Capillary Refill: Capillary refill takes less than 2 seconds.  Neurological:     General: No focal deficit present.     Mental Status: He is alert and oriented to person, place, and time.     Deep Tendon Reflexes: Reflexes normal.     ED Results / Procedures / Treatments   Labs (all labs ordered are listed, but only abnormal results are displayed) Results for orders placed or performed during the hospital encounter of 05/08/20  CBC with Differential  Result Value Ref Range   WBC 13.9 (H) 4.0 - 10.5 K/uL   RBC 3.29 (L) 4.22 - 5.81 MIL/uL   Hemoglobin 9.8 (L) 13.0 - 17.0 g/dL   HCT 05/10/20 (L) 11.9 - 14.7 %   MCV 94.8 80.0 - 100.0 fL   MCH 29.8 26.0 - 34.0 pg   MCHC 31.4 30.0 - 36.0 g/dL   RDW 82.9 56.2 - 13.0 %   Platelets 873 (H) 150 - 400 K/uL   nRBC 0.1 0.0 - 0.2 %   Neutrophils Relative % 76 %   Neutro Abs 10.7 (H) 1.7 - 7.7 K/uL   Lymphocytes Relative 11 %   Lymphs Abs 1.5 0.7 - 4.0 K/uL   Monocytes Relative 8 %   Monocytes Absolute 1.1 (H) 0.1 - 1.0 K/uL   Eosinophils Relative 3 %   Eosinophils Absolute 0.4 0.0 - 0.5 K/uL   Basophils Relative 1 %   Basophils Absolute 0.1 0.0 - 0.1 K/uL   Immature Granulocytes 1 %   Abs Immature Granulocytes 0.16 (H) 0.00 - 0.07 K/uL  Basic metabolic panel  Result Value Ref Range   Sodium 136 135 - 145 mmol/L   Potassium 3.9 3.5 - 5.1 mmol/L   Chloride 100 98 - 111 mmol/L   CO2 23 22 - 32 mmol/L   Glucose, Bld 113 (H) 70 - 99 mg/dL   BUN 12 8 -  23 mg/dL   Creatinine, Ser 1.61 0.61 - 1.24 mg/dL   Calcium 8.3 (L) 8.9 - 10.3 mg/dL   GFR calc non Af Amer >60 >60 mL/min   GFR calc Af Amer >60 >60 mL/min   Anion gap 13 5 - 15   DG Chest 2 View  Result Date: 05/08/2020 CLINICAL DATA:  Drainage at CABG incision  site. EXAM: CHEST - 2 VIEW COMPARISON:  May 02, 2020 FINDINGS: Multiple sternal wires are seen. Moderate to marked severity left basilar atelectasis and/or infiltrate is seen. There is a very small right pleural effusion with a small to moderate size left pleural effusion. The cardiac silhouette is mildly enlarged and unchanged in size. Multilevel degenerative changes are noted throughout the thoracic spine. IMPRESSION: 1. Evidence of prior median sternotomy. 2. Moderate to marked severity left basilar atelectasis and/or infiltrate. 3. Very small right pleural effusion with a small to moderate size left pleural effusion. Electronically Signed   By: Aram Candela M.D.   On: 05/08/2020 22:51   DG Chest 2 View  Result Date: 05/02/2020 CLINICAL DATA:  Shortness of breath. Postop. EXAM: CHEST - 2 VIEW COMPARISON:  Chest x-ray dated 04/30/2020. FINDINGS: Heart size and mediastinal contours are stable. Median sternotomy wires appear intact and stable in alignment. Probable mild bibasilar atelectasis. Probable small bilateral pleural effusions. Lungs otherwise clear. No pneumothorax is seen. RIGHT IJ sheath has been removed in the interval. IMPRESSION: Probable mild bibasilar atelectasis and small bilateral pleural effusions. Electronically Signed   By: Bary Richard M.D.   On: 05/02/2020 10:31   CT Angio Chest PE W and/or Wo Contrast  Result Date: 05/09/2020 CLINICAL DATA:  Drainage from CABG site. EXAM: CT ANGIOGRAPHY CHEST WITH CONTRAST TECHNIQUE: Multidetector CT imaging of the chest was performed using the standard protocol during bolus administration of intravenous contrast. Multiplanar CT image reconstructions and MIPs were obtained to evaluate the vascular anatomy. CONTRAST:  80mL OMNIPAQUE IOHEXOL 350 MG/ML SOLN COMPARISON:  May 06, 2017 FINDINGS: Cardiovascular: Contrast injection is sufficient to demonstrate satisfactory opacification of the pulmonary arteries to the segmental level. There is no  pulmonary embolus. The main pulmonary artery is within normal limits for size. There is no CT evidence of acute right heart strain. There are atherosclerotic changes of the thoracic aorta without evidence for dissection or aneurysm. Heart size is mildly enlarged. The patient is status post prior median sternotomy. There is a trace pericardial effusion. Mediastinum/Nodes: --there are mildly enlarged mediastinal and hilar lymph nodes that are likely reactive. There is some retrosternal free fluid which is felt to be postsurgical in etiology. There is no evidence for a well-formed drainable fluid collection or abscess. --No axillary lymphadenopathy. --No supraclavicular lymphadenopathy. --Normal thyroid gland. --The esophagus is unremarkable Lungs/Pleura: There are small to moderate-sized bilateral pleural effusions, left greater than right. There is compressive atelectasis at the left lung base. Upper Abdomen: No acute abnormality. Musculoskeletal: No chest wall abnormality. No acute or significant osseous findings. There is no fluid collection in the anterior chest wall to explain the patient's serous sanguinous drainage. Review of the MIP images confirms the above findings. IMPRESSION: 1. No evidence for pulmonary embolus. 2. Small to moderate-sized bilateral pleural effusions, left greater than right, with compressive atelectasis at the left lung base. 3. There is retrosternal free fluid which is felt to be postsurgical in etiology and may represent a postoperative seroma or hematoma. There is no CT evidence to suggest that this is a forming abscess at this time. 4.  Mildly enlarged mediastinal and hilar lymph nodes that are likely reactive in etiology. Aortic Atherosclerosis (ICD10-I70.0). Electronically Signed   By: Katherine Mantle M.D.   On: 05/09/2020 00:30   CARDIAC CATHETERIZATION  Result Date: 04/27/2020  Prox RCA lesion is 100% stenosed.  Prox Cx to Mid Cx lesion is 100% stenosed.  Ramus lesion is  90% stenosed.  Mid LAD lesion is 100% stenosed.  LV end diastolic pressure is moderately elevated.  1. 3 vessel occlusive CAD.    - 100% mid LAD after a large diagonal branch. Faint right to left collaterals.    - 90% ramus intermediate    - 100% CTO of the mid LCx with good left to left collaterals    - 100% CTO of the proximal RCA. Good right to right and left to right collaterals. 2. Difficult to assess LV function due to underfilling. Will assess with Echo 3. Moderately elevated LV filling pressures. Improved with IV lasix and IABP 4. Normal right heart pressures 5. Preserved cardiac output 4.4 liters/min Plan: transfer to ICU. Heparinize. IABP support. CT surgery consultation for CABG. Assess LV function with Echo. Continue diuresis. Trend troponin levels.   DG Chest Port 1 View  Result Date: 04/30/2020 CLINICAL DATA:  Recent CABG. EXAM: PORTABLE CHEST 1 VIEW COMPARISON:  Chest x-ray from yesterday. FINDINGS: Interval removal of the endotracheal tube, enteric tube, and Swan-Ganz catheter. The right internal jugular sheath remains in place. Bilateral chest tubes and mediastinal drain are unchanged. Stable cardiomediastinal silhouette status post CABG. Persistent low lung volumes with coarsened interstitial markings. No focal consolidation, pleural effusion, or pneumothorax. No acute osseous abnormality. IMPRESSION: Interval extubation.  Persistent low lung volumes. Electronically Signed   By: Obie Dredge M.D.   On: 04/30/2020 08:33   DG Chest Port 1 View  Result Date: 04/29/2020 CLINICAL DATA:  79 year old male with a history CABG yesterday EXAM: PORTABLE CHEST 1 VIEW COMPARISON:  04/28/2020, 04/27/2020 FINDINGS: Cardiomediastinal silhouette unchanged in size and contour, accentuated by low lung volumes. Surgical changes of median sternotomy and CABG. Endotracheal tube terminates 5.4 cm above the carina. Gastric tube terminates within the left upper quadrant with the side port at the GE junction.  Right IJ sheath transmits a Swan-Ganz catheter which terminates in the proximal pulmonary artery. Mediastinal/pleural drains are unchanged. Epicardial pacing leads are not visualized. Low lung volumes with coarsened interstitial markings. No pneumothorax. No pleural effusion. IMPRESSION: Early surgical changes of CABG, with low lung volumes and atelectasis. No visualized pneumothorax with unchanged mediastinal/pleural drains. Unchanged position of endotracheal tube, gastric tube, and right IJ sheath which transmits Swan-Ganz catheter. Electronically Signed   By: Gilmer Mor D.O.   On: 04/29/2020 07:52   DG Chest Port 1 View  Result Date: 04/28/2020 CLINICAL DATA:  Post CABG EXAM: PORTABLE CHEST 1 VIEW COMPARISON:  04/27/2020 FINDINGS: Changes of CABG. NG tube tip is in the distal esophagus near the GE junction. Endotracheal tube is 5 cm above the carina. Bilateral chest tubes. No pneumothorax. Swan-Ganz catheter tip is in the main pulmonary artery. Bibasilar atelectasis. IMPRESSION: Postoperative changes.  Bibasilar atelectasis.  No pneumothorax. Electronically Signed   By: Charlett Nose M.D.   On: 04/28/2020 18:20   DG Chest Portable 1 View  Result Date: 04/27/2020 CLINICAL DATA:  Shortness of breath. EXAM: PORTABLE CHEST 1 VIEW COMPARISON:  May 31, 2017 FINDINGS: Moderate severity diffusely increased interstitial lung markings are seen, bilaterally. This represents a new finding when compared to the prior study. There is no  evidence of a pleural effusion or pneumothorax. The heart size and mediastinal contours are within normal limits. Degenerative changes seen throughout the thoracic spine. 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 A superimposed component of acute interstitial edema cannot be excluded. Electronically Signed   By: Aram Candela M.D.   On: 04/27/2020 21:07   ECHOCARDIOGRAM COMPLETE  Result Date: 04/28/2020    ECHOCARDIOGRAM REPORT   Patient Name:    Joshua Mack Date of Exam: 04/28/2020 Medical Rec #:  [id number]      Height:       70.0 in Accession #:    [id number]     Weight:       206.6 lb Date of Birth:  January 26, 1941      BSA:          2.116 m Patient Age:    79 years       BP:           164/105 mmHg Patient Gender: M              HR:           92 bpm. Exam Location:  Inpatient Procedure: 2D Echo Indications:    I21.3 STEMI  History:        Patient has prior history of Echocardiogram examinations, most                 recent 05/07/2017. Risk Factors:Hypertension and Dyslipidemia.  Sonographer:    Celene Skeen RDCS (AE) Referring Phys: [phone] AMANDA C CONIGLIO IMPRESSIONS  1. No intracavitary thrombus is seen. The dyskinetic anteroapical area is not thinned or hyperechoic, suggesting recent ischemia/infarction. Left ventricular ejection fraction, by estimation, is 30 to 35%. The left ventricle has moderate to severely decreased function. The left ventricle demonstrates regional wall motion abnormalities (see scoring diagram/findings for description). Left ventricular diastolic parameters are consistent with Grade II diastolic dysfunction (pseudonormalization). Elevated left atrial pressure.  2. Right ventricular systolic function is normal. The right ventricular size is normal. A catheter or pacemaker wire is seen in the right ventricle. is visualized.  3. The mitral valve is normal in structure. No evidence of mitral valve regurgitation.  4. The aortic valve is normal in structure. Aortic valve regurgitation is not visualized. Comparison(s): Changes from prior study are noted. The left ventricular function is significantly worse. The left ventricular wall motion abnormalities are new. FINDINGS  Left Ventricle: No intracavitary thrombus is seen. The dyskinetic anteroapical area is not thinned or hyperechoic, suggesting recent ischemia/infarction. Left ventricular ejection fraction, by estimation, is 30 to 35%. The left ventricle has moderate to  severely  decreased function. The left ventricle demonstrates regional wall motion abnormalities. The left ventricular internal cavity size was normal in size. There is no left ventricular hypertrophy. Left ventricular diastolic parameters are consistent  with Grade II diastolic dysfunction (pseudonormalization). Elevated left atrial pressure.  LV Wall Scoring: The apical lateral segment, apical septal segment, apical anterior segment, and apex are dyskinetic. The mid anteroseptal segment, mid anterior segment, and apical inferior segment are akinetic. The antero-lateral wall, inferior wall, posterior wall, basal anteroseptal segment, mid inferoseptal segment, basal anterior segment, and basal inferoseptal segment are normal. Right Ventricle: The right ventricular size is normal. No increase in right ventricular wall thickness. Right ventricular systolic function is normal. Left Atrium: Left atrial size was normal in size. Right Atrium: Right atrial size was normal in size. Pericardium: There is no evidence of pericardial effusion. Mitral Valve: The mitral valve  is normal in structure. No evidence of mitral valve regurgitation. Tricuspid Valve: The tricuspid valve is normal in structure. Tricuspid valve regurgitation is not demonstrated. Aortic Valve: The aortic valve is normal in structure. Aortic valve regurgitation is not visualized. Pulmonic Valve: The pulmonic valve was grossly normal. Pulmonic valve regurgitation is not visualized. Aorta: The aortic root is normal in size and structure. IAS/Shunts: No atrial level shunt detected by color flow Doppler. Additional Comments: A catheter or pacemaker wire is seen in the right ventricle. is visualized.  LEFT VENTRICLE PLAX 2D LVIDd:         4.40 cm  Diastology LVIDs:         3.40 cm  LV e' lateral:   11.10 cm/s LV PW:         1.20 cm  LV E/e' lateral: 8.1 LV IVS:        0.90 cm LVOT diam:     2.20 cm LV SV:         47 LV SV Index:   22 LVOT Area:     3.80 cm  LEFT ATRIUM            Index LA diam:      3.40 cm 1.61 cm/m LA Vol (A2C): 58.6 ml 27.69 ml/m  AORTIC VALVE LVOT Vmax:   69.10 cm/s LVOT Vmean:  51.200 cm/s LVOT VTI:    0.124 m  AORTA Ao Root diam: 3.10 cm MITRAL VALVE MV Area (PHT): 6.27 cm    SHUNTS MV Decel Time: 121 msec    Systemic VTI:  0.12 m MV E velocity: 89.70 cm/s  Systemic Diam: 2.20 cm MV A velocity: 68.90 cm/s MV E/A ratio:  1.30 Mihai Croitoru MD Electronically signed by Thurmon Fair MD Signature Date/Time: 04/28/2020/10:53:22 AM    Final    ECHO INTRAOPERATIVE TEE  Result Date: 04/28/2020  *INTRAOPERATIVE TRANSESOPHAGEAL REPORT *  Patient Name:   Joshua Mack Date of Exam: 04/28/2020 Medical Rec #:  [id number]      Height:       70.0 in Accession #:    [id number]     Weight:       206.6 lb Date of Birth:  [birth year]      BSA:          2.12 m Patient Age:    79 years       BP:           165/105 mmHg Patient Gender: M              HR:           75 bpm. Exam Location:  Anesthesiology Transesophogeal exam was perform intraoperatively during surgical procedure. Patient was closely monitored under general anesthesia during the entirety of examination. Indications:     Coronary artery disease Sonographer:     Renella Cunas RDCS Performing Phys: [id number] Eliezer Lofts LIGHTFOOT Diagnosing Phys: Lewie Loron MD Complications: No known complications during this procedure. POST-OP IMPRESSIONS - Right Ventricle: The right ventricle appears unchanged from pre-bypass. - Aorta: The aorta appears unchanged from pre-bypass. - Left Atrium: The left atrium appears unchanged from pre-bypass. - Left Atrial Appendage: The left atrial appendage appears unchanged from pre-bypass. - Aortic Valve: The aortic valve appears unchanged from pre-bypass. - Mitral Valve: The mitral valve appears unchanged from pre-bypass. - Tricuspid Valve: The tricuspid valve appears unchanged from pre-bypass. - Interatrial Septum: The interatrial septum appears unchanged from pre-bypass. - Interventricular  Septum: The interventricular septum appears unchanged  from pre-bypass. - Pericardium: The pericardium appears unchanged from pre-bypass. PRE-OP FINDINGS  Left Ventricle: The left ventricle has mildly reduced systolic function, with an ejection fraction of 45-50%. The cavity size was moderately dilated. There is moderate concentric left ventricular hypertrophy. Right Ventricle: The right ventricle has normal systolic function. The cavity was mildly enlarged. There is no increase in right ventricular wall thickness. Left Atrium: Left atrial size was normal in size. The left atrial appendage is well visualized and there is no evidence of thrombus present. Right Atrium: Right atrial size was normal in size. Interatrial Septum: No atrial level shunt detected by color flow Doppler. Pericardium: There is no evidence of pericardial effusion. There is a small pleural effusion in the right lateral region. Mitral Valve: The mitral valve is normal in structure. Mild thickening of the mitral valve leaflet. Mitral valve regurgitation is mild by color flow Doppler. There is no evidence of mitral valve vegetation. Tricuspid Valve: The tricuspid valve was normal in structure. Tricuspid valve regurgitation is mild by color flow Doppler. There is no evidence of tricuspid valve vegetation. Aortic Valve: The aortic valve is tricuspid There is mild thickening of the aortic valve Aortic valve regurgitation is trivial by color flow Doppler. There is no evidence of aortic valve stenosis. There is no evidence of a vegetation on the aortic valve. Pulmonic Valve: The pulmonic valve was normal in structure. Pulmonic valve regurgitation is trivial by color flow Doppler. Aorta: There is evidence of atheroma immobile plaque in the descending aorta; Grade III, measuring 3-33mm in size. +-------------+--------++ AORTIC VALVE          +-------------+--------++ AV Mean Grad:3.0 mmHg +-------------+--------++  Nolon Nations MD Electronically  signed by Nolon Nations MD Signature Date/Time: 04/28/2020/4:53:34 PM    Final     EKG None  Radiology DG Chest 2 View  Result Date: 05/08/2020 CLINICAL DATA:  Drainage at CABG incision site. EXAM: CHEST - 2 VIEW COMPARISON:  May 02, 2020 FINDINGS: Multiple sternal wires are seen. Moderate to marked severity left basilar atelectasis and/or infiltrate is seen. There is a very small right pleural effusion with a small to moderate size left pleural effusion. The cardiac silhouette is mildly enlarged and unchanged in size. Multilevel degenerative changes are noted throughout the thoracic spine. IMPRESSION: 1. Evidence of prior median sternotomy. 2. Moderate to marked severity left basilar atelectasis and/or infiltrate. 3. Very small right pleural effusion with a small to moderate size left pleural effusion. Electronically Signed   By: Virgina Norfolk M.D.   On: 05/08/2020 22:51   CT Angio Chest PE W and/or Wo Contrast  Result Date: 05/09/2020 CLINICAL DATA:  Drainage from CABG site. EXAM: CT ANGIOGRAPHY CHEST WITH CONTRAST TECHNIQUE: Multidetector CT imaging of the chest was performed using the standard protocol during bolus administration of intravenous contrast. Multiplanar CT image reconstructions and MIPs were obtained to evaluate the vascular anatomy. CONTRAST:  23mL OMNIPAQUE IOHEXOL 350 MG/ML SOLN COMPARISON:  May 06, 2017 FINDINGS: Cardiovascular: Contrast injection is sufficient to demonstrate satisfactory opacification of the pulmonary arteries to the segmental level. There is no pulmonary embolus. The main pulmonary artery is within normal limits for size. There is no CT evidence of acute right heart strain. There are atherosclerotic changes of the thoracic aorta without evidence for dissection or aneurysm. Heart size is mildly enlarged. The patient is status post prior median sternotomy. There is a trace pericardial effusion. Mediastinum/Nodes: --there are mildly enlarged mediastinal and hilar  lymph nodes that are likely reactive. There  is some retrosternal free fluid which is felt to be postsurgical in etiology. There is no evidence for a well-formed drainable fluid collection or abscess. --No axillary lymphadenopathy. --No supraclavicular lymphadenopathy. --Normal thyroid gland. --The esophagus is unremarkable Lungs/Pleura: There are small to moderate-sized bilateral pleural effusions, left greater than right. There is compressive atelectasis at the left lung base. Upper Abdomen: No acute abnormality. Musculoskeletal: No chest wall abnormality. No acute or significant osseous findings. There is no fluid collection in the anterior chest wall to explain the patient's serous sanguinous drainage. Review of the MIP images confirms the above findings. IMPRESSION: 1. No evidence for pulmonary embolus. 2. Small to moderate-sized bilateral pleural effusions, left greater than right, with compressive atelectasis at the left lung base. 3. There is retrosternal free fluid which is felt to be postsurgical in etiology and may represent a postoperative seroma or hematoma. There is no CT evidence to suggest that this is a forming abscess at this time. 4. Mildly enlarged mediastinal and hilar lymph nodes that are likely reactive in etiology. Aortic Atherosclerosis (ICD10-I70.0). Electronically Signed   By: Katherine Mantle M.D.   On: 05/09/2020 00:30    Procedures Procedures (including critical care time)  Medications Ordered in ED Medications  iohexol (OMNIPAQUE) 350 MG/ML injection 80 mL (80 mLs Intravenous Contrast Given 05/09/20 0018)    ED Course  I have reviewed the triage vital signs and the nursing notes.  Pertinent labs & imaging results that were available during my care of the patient were reviewed by me and considered in my medical decision making (see chart for details).   Drainage had stopped by the time of my evaluation.    Seen in the ED by Dr. Vickey Sages of [facility].  Consult to IR for  drainage of pleural effusion placed by me.  This procedure will be done in the am as patient is hemodynamically stable at this time.    Final Clinical Impression(s) / ED Diagnoses Final diagnoses:  Pleural effusion  Wound dehiscence   Admit to [facility]    Ajani Rineer, MD 05/09/20 1610

## 2020-05-09 NOTE — ED Notes (Signed)
Lunch tray ordered 

## 2020-05-09 NOTE — H&P (Signed)
[address]       [place] [id number]             [phone]        Marca AnconaRobert L Sorey [facility] Medical Record #[id number] Date of Birth: 1941-05-29  Referring: No ref. provider found Primary Care: [name], [name], MD Primary Cardiologist:No primary care provider on file.  Chief Complaint:    Chief Complaint  Patient presents with  . Post-op Problem    CABG    History of Present Illness:      79 yo man is status post coronary bypass grafting by Dr. Cliffton AstersLightfoot on 04/28/2020.  He was discharged in the hospital in good condition several days later.  He was last seen 3 days ago in the office by Dr. Cliffton AstersLightfoot.  The patient was brought to the emergency department late last night by EMS due to sudden expression of fluid from his sternal incision.  He denies fevers or chills.  He has reported a cough over the last couple of days.  He denies sternal instability.  Furthermore, the patient denies angina or shortness of breath.  Current Activity/ Functional Status: Patient will be independent with mobility/ambulation, transfers, ADL's, IADL's.   Zubrod Score: At the time of surgery this patient's most appropriate activity status/level should be described as: []     0    Normal activity, no symptoms []     1    Restricted in physical strenuous activity but ambulatory, able to do out light work []     2    Ambulatory and capable of self care, unable to do work activities, up and about                 more than 50%  Of the time                            []     3    Only limited self care, in bed greater than 50% of waking hours []     4    Completely disabled, no self care, confined to bed or chair []     5    Moribund  Past Medical History:  Diagnosis Date  . Coronary artery disease   . High cholesterol   . Hypertension   . Sleep apnea     Past Surgical History:  Procedure Laterality Date  . CARDIAC SURGERY    . CORONARY ARTERY BYPASS GRAFT N/A 04/28/2020   Procedure:  CORONARY ARTERY BYPASS GRAFTING (CABG) times four using LIMA to LAD; bilateral endoscopic greater saphenous vein harvest: SVG to Circ; SVG to RAMUS; and SVG to PDA.;  Surgeon: Corliss SkainsLightfoot, Harrell O, MD;  [facility];  Service: Open Heart Surgery;  Laterality: N/A;  . ENDOVEIN HARVEST OF GREATER SAPHENOUS VEIN Bilateral 04/28/2020   Procedure: Mack GuiseEndovein Harvest Of Greater Saphenous Vein;  Surgeon: Corliss SkainsLightfoot, Harrell O, MD;  [facility];  Service: Open Heart Surgery;  Laterality: Bilateral;  . IABP INSERTION N/A 04/27/2020   Procedure: IABP Insertion;  Surgeon: SwazilandJordan, Peter M, MD;  [facility];  Service: Cardiovascular;  Laterality: N/A;  . LUMBAR LAMINECTOMY/DECOMPRESSION MICRODISCECTOMY N/A 05/03/2017   Procedure: Microlumbar decompression L4-5, L3-4,  L5-S1 WITH LATERAL AUTOGRAFT FUSION;  Surgeon: Jene EveryBeane, Jeffrey, MD;  Location: [facility] ORS;  Service: Orthopedics;  Laterality: N/A;  . RIGHT/LEFT HEART CATH AND CORONARY ANGIOGRAPHY N/A 04/27/2020   Procedure: RIGHT/LEFT HEART CATH AND  CORONARY ANGIOGRAPHY;  Surgeon: Swaziland, Peter M, MD;  [facility] INVASIVE CV LAB;  Service: Cardiovascular;  Laterality: N/A;    Social History   Tobacco Use  Smoking Status Never Smoker  Smokeless Tobacco Never Used    Social History   Substance and Sexual Activity  Alcohol Use No     Allergies  Allergen Reactions  . Clemastine   . Fentanyl Other (See Comments)    Had a reaction during a colonoscopy in the past. He was told it required Benadryl.  . Midazolam Other (See Comments)    Had a reaction during a colonoscopy in the past. He was told it required Benadryl.  Marland Kitchen Phenylpropanolamine   . Amoxicillin Rash    Has patient had a PCN reaction causing immediate rash, facial/tongue/throat swelling, SOB or lightheadedness with hypotension: No Has patient had a PCN reaction causing severe rash involving mucus membranes or skin necrosis: No Has patient had a PCN reaction that required  hospitalization: No Has patient had a PCN reaction occurring within the last 10 years: No If all of the above answers are "NO", then may proceed with Cephalosporin use.   . Other Itching, Rash and Other (See Comments)    Strong, perfume-y detergents   . Tavist Nd [Loratadine] Rash    Current Facility-Administered Medications  Medication Dose Route Frequency Provider Last Rate Last Admin  . 0.9 %  sodium chloride infusion  250 mL Intravenous PRN Tierre Netto, Merri Brunette, MD      . acetaminophen (TYLENOL) tablet 650 mg  650 mg Oral Q4H PRN Kamylah Manzo, Merri Brunette, MD      . aspirin EC tablet 81 mg  81 mg Oral Daily Lujean Ebright Z, MD      . heparin injection 5,000 Units  5,000 Units Subcutaneous Q8H Rhema Boyett Z, MD      . ondansetron (ZOFRAN) injection 4 mg  4 mg Intravenous Q6H PRN Khayri Kargbo Z, MD      . sodium chloride flush (NS) 0.9 % injection 3 mL  3 mL Intravenous Q12H Myda Detwiler Z, MD      . sodium chloride flush (NS) 0.9 % injection 3 mL  3 mL Intravenous PRN Linden Dolin, MD       Current Outpatient Medications  Medication Sig Dispense Refill  . aspirin EC 81 MG EC tablet Take 1 tablet (81 mg total) by mouth daily.    Marland Kitchen atorvastatin (LIPITOR) 40 MG tablet Take 1 tablet (40 mg total) by mouth daily. 30 tablet 3  . Bioflavonoid Products (ESTER C PO) Take 500 mg by mouth daily.    . Chromium 1000 MCG TABS Take 1,000 mcg by mouth daily.    . clopidogrel (PLAVIX) 75 MG tablet Take 1 tablet (75 mg total) by mouth daily. 30 tablet 3  . Garlic 1000 MG CAPS Take 1,000 mg by mouth daily.     Chilton Si Tea 250 MG CAPS Take 250 mg by mouth at bedtime.     Boris Lown Oil 1000 MG CAPS Take 1,000 mg by mouth daily.    Marland Kitchen L-Arginine 500 MG CAPS Take 500 mg by mouth daily.    . metoprolol tartrate (LOPRESSOR) 25 MG tablet Take 1 tablet (25 mg total) by mouth 2 (two) times daily. 60 tablet 3  . Misc Natural Products (OSTEO BI-FLEX JOINT SHIELD) TABS Take 1 tablet by mouth daily.    . Misc  Natural Products (URINOZINC PROSTATE) CAPS Take 1 capsule by mouth daily.    Marland Kitchen  Multiple Vitamins-Minerals (CENTRUM SILVER 50+MEN) TABS Take 1 tablet by mouth daily with breakfast.    . polyethylene glycol (MIRALAX / GLYCOLAX) packet Take 17 g by mouth daily. 14 each 0  . Probiotic Product (PROBIOTIC PO) Take 1 capsule by mouth daily as needed (Constipation).    . Resveratrol 250 MG CAPS Take 250 mg by mouth daily.    . shark liver oil-cocoa butter (PREPARATION H) 0.25-88.44 % suppository Place 1 suppository rectally as needed for hemorrhoids.    . Turmeric Curcumin 500 MG CAPS Take 500 mg by mouth daily.     Marland Kitchen acetaminophen (TYLENOL) 500 MG tablet Take 500-1,000 mg by mouth 2 (two) times daily as needed for mild pain or headache.     . dextromethorphan-guaiFENesin (MUCINEX DM) 30-600 MG 12hr tablet Take 1 tablet by mouth 2 (two) times daily as needed for cough.    . docusate sodium (COLACE) 100 MG capsule Take 1 capsule (100 mg total) by mouth 2 (two) times daily as needed for mild constipation. (Patient not taking: Reported on 05/07/2020) 30 capsule 1  . Melatonin 5 MG TABS Take 5 mg by mouth at bedtime.    . potassium chloride SA (KLOR-CON) 20 MEQ tablet Take 1 tablet (20 mEq total) by mouth daily. 15 tablet 0  . PRESCRIPTION MEDICATION CPAP- At bedtime and during times of rest    . traMADol (ULTRAM) 50 MG tablet Take 1 tablet (50 mg total) by mouth every 4 (four) hours as needed for moderate pain. 30 tablet 0    (Not in a hospital admission)   History reviewed. No pertinent family history.   Review of Systems:   ROS A comprehensive review of systems was negative.     Cardiac Review of Systems: Y or  [    ]= no  Chest Pain [    ]  Resting SOB [   ] Exertional SOB  [  ]  Orthopnea [  ]   Pedal Edema [   ]    Palpitations [  ] Syncope  [  ]   Presyncope [   ]  General Review of Systems: [Y] = yes [  ]=no Constitional: recent weight change [  ]; anorexia [  ]; fatigue [  ]; nausea [  ];  night sweats [  ]; fever [  ]; or chills [  ]                                                               Dental: Last Dentist visit:   Eye : blurred vision [  ]; diplopia [   ]; vision changes [  ];  Amaurosis fugax[  ]; Resp: cough [  ];  wheezing[  ];  hemoptysis[  ]; shortness of breath[  ]; paroxysmal nocturnal dyspnea[  ]; dyspnea on exertion[  ]; or orthopnea[  ];  GI:  gallstones[  ], vomiting[  ];  dysphagia[  ]; melena[  ];  hematochezia [  ]; heartburn[  ];   Hx of  Colonoscopy[  ]; GU: kidney stones [  ]; hematuria[  ];   dysuria [  ];  nocturia[  ];  history of     obstruction [  ]; urinary frequency [  ]  Skin: rash, swelling[  ];, hair loss[  ];  peripheral edema[  ];  or itching[  ]; Musculosketetal: myalgias[  ];  joint swelling[  ];  joint erythema[  ];  joint pain[  ];  back pain[  ];  Heme/Lymph: bruising[  ];  bleeding[  ];  anemia[  ];  Neuro: TIA[  ];  headaches[  ];  stroke[  ];  vertigo[  ];  seizures[  ];   paresthesias[  ];  difficulty walking[  ];  Psych:depression[  ]; anxiety[  ];  Endocrine: diabetes[  ];  thyroid dysfunction[  ];         Physical Exam: BP (!) 153/92   Pulse (!) 106   Temp 99.7 F (37.6 C) (Oral)   Resp (!) 23   Ht 5\' 10"  (1.778 m)   Wt 105 kg   SpO2 91%   BMI 33.21 kg/m    General appearance: alert and cooperative Head: Normocephalic, without obvious abnormality, atraumatic Neck: no adenopathy, no carotid bruit, no JVD, supple, symmetrical, trachea midline and thyroid not enlarged, symmetric, no tenderness/mass/nodules Resp: diminished breath sounds LLL Cardio: regular rate and rhythm, S1, S2 normal, no murmur, click, rub or gallop GI: soft, non-tender; bowel sounds normal; no masses,  no organomegaly Extremities: edema Mild Neurologic: Alert and oriented X 3, normal strength and tone. Normal symmetric reflexes. Normal coordination and gait  Diagnostic Studies & Laboratory data:     Recent Radiology Findings:   DG  Chest 2 View  Result Date: 05/08/2020 CLINICAL DATA:  Drainage at CABG incision site. EXAM: CHEST - 2 VIEW COMPARISON:  May 02, 2020 FINDINGS: Multiple sternal wires are seen. Moderate to marked severity left basilar atelectasis and/or infiltrate is seen. There is a very small right pleural effusion with a small to moderate size left pleural effusion. The cardiac silhouette is mildly enlarged and unchanged in size. Multilevel degenerative changes are noted throughout the thoracic spine. IMPRESSION: 1. Evidence of prior median sternotomy. 2. Moderate to marked severity left basilar atelectasis and/or infiltrate. 3. Very small right pleural effusion with a small to moderate size left pleural effusion. Electronically Signed   By: May 04, 2020 M.D.   On: 05/08/2020 22:51   CT Angio Chest PE W and/or Wo Contrast  Result Date: 05/09/2020 CLINICAL DATA:  Drainage from CABG site. EXAM: CT ANGIOGRAPHY CHEST WITH CONTRAST TECHNIQUE: Multidetector CT imaging of the chest was performed using the standard protocol during bolus administration of intravenous contrast. Multiplanar CT image reconstructions and MIPs were obtained to evaluate the vascular anatomy. CONTRAST:  27mL OMNIPAQUE IOHEXOL 350 MG/ML SOLN COMPARISON:  May 06, 2017 FINDINGS: Cardiovascular: Contrast injection is sufficient to demonstrate satisfactory opacification of the pulmonary arteries to the segmental level. There is no pulmonary embolus. The main pulmonary artery is within normal limits for size. There is no CT evidence of acute right heart strain. There are atherosclerotic changes of the thoracic aorta without evidence for dissection or aneurysm. Heart size is mildly enlarged. The patient is status post prior median sternotomy. There is a trace pericardial effusion. Mediastinum/Nodes: --there are mildly enlarged mediastinal and hilar lymph nodes that are likely reactive. There is some retrosternal free fluid which is felt to be postsurgical in  etiology. There is no evidence for a well-formed drainable fluid collection or abscess. --No axillary lymphadenopathy. --No supraclavicular lymphadenopathy. --Normal thyroid gland. --The esophagus is unremarkable Lungs/Pleura: There are small to moderate-sized bilateral pleural effusions, left greater than right. There is compressive atelectasis at  the left lung base. Upper Abdomen: No acute abnormality. Musculoskeletal: No chest wall abnormality. No acute or significant osseous findings. There is no fluid collection in the anterior chest wall to explain the patient's serous sanguinous drainage. Review of the MIP images confirms the above findings. IMPRESSION: 1. No evidence for pulmonary embolus. 2. Small to moderate-sized bilateral pleural effusions, left greater than right, with compressive atelectasis at the left lung base. 3. There is retrosternal free fluid which is felt to be postsurgical in etiology and may represent a postoperative seroma or hematoma. There is no CT evidence to suggest that this is a forming abscess at this time. 4. Mildly enlarged mediastinal and hilar lymph nodes that are likely reactive in etiology. Aortic Atherosclerosis (ICD10-I70.0). Electronically Signed   By: Katherine Mantle M.D.   On: 05/09/2020 00:30     I have independently reviewed the above radiologic studies and discussed with the patient   Recent Lab Findings: Lab Results  Component Value Date   WBC 13.9 (H) 05/08/2020   HGB 9.8 (L) 05/08/2020   HCT 31.2 (L) 05/08/2020   PLT 873 (H) 05/08/2020   GLUCOSE 113 (H) 05/08/2020   CHOL 280 (H) 04/27/2020   TRIG 149 04/27/2020   HDL 57 04/27/2020   LDLCALC NOT CALCULATED 04/27/2020   ALT 30 04/27/2020   AST 53 (H) 04/27/2020   NA 136 05/08/2020   K 3.9 05/08/2020   CL 100 05/08/2020   CREATININE 1.13 05/08/2020   BUN 12 05/08/2020   CO2 23 05/08/2020   INR 1.5 (H) 04/28/2020   HGBA1C 5.7 (H) 04/27/2020      Assessment / Plan:      79 year old  gentleman with radiographic evidence of a moderate to large left pleural effusion.  This is likely popping off the lower aspect of his sternal incision especially when he is sitting upright.  Suggest draining the left pleural effusion with a pigtail catheter.  He would likely benefit from couple of days admission for heart failure assessment and management.    I  spent 20 minutes counseling the patient face to face.  Aijalon Demuro Z. Vickey Sages, MD Cardiothoracic Surgeyr 05/09/2020 10:23 AM

## 2020-05-09 NOTE — ED Notes (Signed)
Lunch Tray Ordered @ 1033. 

## 2020-05-10 ENCOUNTER — Inpatient Hospital Stay (HOSPITAL_COMMUNITY): Payer: Commercial Managed Care - PPO

## 2020-05-10 LAB — BASIC METABOLIC PANEL
Anion gap: 9 (ref 5–15)
BUN: 13 mg/dL (ref 8–23)
CO2: 22 mmol/L (ref 22–32)
Calcium: 8.1 mg/dL — ABNORMAL LOW (ref 8.9–10.3)
Chloride: 105 mmol/L (ref 98–111)
Creatinine, Ser: 1.54 mg/dL — ABNORMAL HIGH (ref 0.61–1.24)
GFR calc Af Amer: 49 mL/min — ABNORMAL LOW (ref >60)
GFR calc non Af Amer: 43 mL/min — ABNORMAL LOW (ref >60)
Glucose, Bld: 146 mg/dL — ABNORMAL HIGH (ref 70–99)
Potassium: 4.2 mmol/L (ref 3.5–5.1)
Sodium: 136 mmol/L (ref 135–145)

## 2020-05-10 MED ORDER — LISINOPRIL 10 MG PO TABS
10.0000 mg | ORAL_TABLET | Freq: Every day | ORAL | Status: DC
  Administered 2020-05-11 – 2020-05-13 (×3): 10 mg via ORAL
  Filled 2020-05-10 (×3): qty 1

## 2020-05-10 MED ORDER — IPRATROPIUM-ALBUTEROL 0.5-2.5 (3) MG/3ML IN SOLN
3.0000 mL | Freq: Four times a day (QID) | RESPIRATORY_TRACT | Status: DC
  Administered 2020-05-10 – 2020-05-11 (×4): 3 mL via RESPIRATORY_TRACT
  Filled 2020-05-10 (×4): qty 3

## 2020-05-10 MED ORDER — COLCHICINE 0.6 MG PO TABS
0.6000 mg | ORAL_TABLET | Freq: Two times a day (BID) | ORAL | Status: DC
  Administered 2020-05-10 – 2020-05-13 (×6): 0.6 mg via ORAL
  Filled 2020-05-10 (×6): qty 1

## 2020-05-10 NOTE — Progress Notes (Signed)
      [address]       [place] [address]             [phone]      Subjective: Says he feels much better after chest tube placement. Had a lot of pain initially after placement but this has improved with the PCA.   Objective: Vital signs in last 24 hours: Temp:  [97.6 F (36.4 C)-99.3 F (37.4 C)] 98.1 F (36.7 C) (05/23 1120) Pulse Rate:  [80-114] 80 (05/23 1120) Cardiac Rhythm: Normal sinus rhythm;Bundle branch block (05/23 0736) Resp:  [12-28] 14 (05/23 1210) BP: (93-143)/(62-95) 111/88 (05/23 1120) SpO2:  [90 %-100 %] 97 % (05/23 1210) Weight:  [89.1 kg] 89.1 kg (05/23 0333)   Intake/Output from previous day: 05/22 0701 - 05/23 0700 In: 360 [P.O.:360] Out: 3310 [Urine:1850; Chest Tube:1460] Intake/Output this shift: No intake/output data recorded.  General appearance: alert, cooperative and mild distress Neurologic: intact Heart: regular rate and rhythm Lungs: Still has some tachypnea and exp wheezes from the left side. The left CT has drained since placement yesterday.  Abdomen: Firm but non-tender Extremities: Warm and well perfused, minimal edema Wound: The sternotomy incision is healing well and is dry.   Lab Results: Recent Labs    05/08/20 2227 05/09/20 1439  WBC 13.9* 16.8*  HGB 9.8* 10.9*  HCT 31.2* 34.7*  PLT 873* 1,008*   BMET:  Recent Labs    05/08/20 2227 05/08/20 2227 05/09/20 1439 05/10/20 0428  NA 136  --   --  136  K 3.9  --   --  4.2  CL 100  --   --  105  CO2 23  --   --  22  GLUCOSE 113*  --   --  146*  BUN 12  --   --  13  CREATININE 1.13   < > 1.06 1.54*  CALCIUM 8.3*  --   --  8.1*   < > = values in this interval not displayed.    PT/INR: No results for input(s): LABPROT, INR in the last 72 hours. ABG    Component Value Date/Time   PHART 7.459 (H) 04/30/2020 0255   HCO3 20.3 04/30/2020 0255   TCO2 21 (L) 04/30/2020 0255   ACIDBASEDEF 3.0 (H) 04/30/2020 0255   O2SAT 86.0 04/30/2020  0255   CBG (last 3)  No results for input(s): GLUCAP in the last 72 hours.  Assessment/Plan:  -Readmission for symptomatic left pleural effusion. Respiratory status improved after drainage with a left pleural tube. CXR following drainage shows residual density left LL c/w ATX vs infiltrate. Concerned this may be pneumonia given increasing WBC and exp wheezing. Will encourage pulmonary hygiene, add DuoNeb HHN, flutter valve, repeat lab and CXR in AM. Low threshold for starting ABX.    LOS: 1 day    Leary Roca, PA-C [phone] 05/10/2020

## 2020-05-11 ENCOUNTER — Inpatient Hospital Stay (HOSPITAL_COMMUNITY): Payer: Commercial Managed Care - PPO

## 2020-05-11 ENCOUNTER — Encounter (HOSPITAL_COMMUNITY): Payer: Self-pay | Admitting: Cardiothoracic Surgery

## 2020-05-11 LAB — CBC WITH DIFFERENTIAL/PLATELET
Abs Immature Granulocytes: 0.08 10*3/uL — ABNORMAL HIGH (ref 0.00–0.07)
Basophils Absolute: 0.1 10*3/uL (ref 0.0–0.1)
Basophils Relative: 1 %
Eosinophils Absolute: 0.8 10*3/uL — ABNORMAL HIGH (ref 0.0–0.5)
Eosinophils Relative: 6 %
HCT: 30 % — ABNORMAL LOW (ref 39.0–52.0)
Hemoglobin: 9.3 g/dL — ABNORMAL LOW (ref 13.0–17.0)
Immature Granulocytes: 1 %
Lymphocytes Relative: 11 %
Lymphs Abs: 1.6 10*3/uL (ref 0.7–4.0)
MCH: 29.8 pg (ref 26.0–34.0)
MCHC: 31 g/dL (ref 30.0–36.0)
MCV: 96.2 fL (ref 80.0–100.0)
Monocytes Absolute: 1.1 10*3/uL — ABNORMAL HIGH (ref 0.1–1.0)
Monocytes Relative: 8 %
Neutro Abs: 11 10*3/uL — ABNORMAL HIGH (ref 1.7–7.7)
Neutrophils Relative %: 73 %
Platelets: 857 10*3/uL — ABNORMAL HIGH (ref 150–400)
RBC: 3.12 MIL/uL — ABNORMAL LOW (ref 4.22–5.81)
RDW: 13.7 % (ref 11.5–15.5)
WBC: 14.6 10*3/uL — ABNORMAL HIGH (ref 4.0–10.5)
nRBC: 0 % (ref 0.0–0.2)

## 2020-05-11 LAB — BASIC METABOLIC PANEL
Anion gap: 12 (ref 5–15)
BUN: 18 mg/dL (ref 8–23)
CO2: 23 mmol/L (ref 22–32)
Calcium: 8.1 mg/dL — ABNORMAL LOW (ref 8.9–10.3)
Chloride: 101 mmol/L (ref 98–111)
Creatinine, Ser: 1.58 mg/dL — ABNORMAL HIGH (ref 0.61–1.24)
GFR calc Af Amer: 48 mL/min — ABNORMAL LOW (ref >60)
GFR calc non Af Amer: 41 mL/min — ABNORMAL LOW (ref >60)
Glucose, Bld: 117 mg/dL — ABNORMAL HIGH (ref 70–99)
Potassium: 4.2 mmol/L (ref 3.5–5.1)
Sodium: 136 mmol/L (ref 135–145)

## 2020-05-11 LAB — PATHOLOGIST SMEAR REVIEW

## 2020-05-11 MED ORDER — IPRATROPIUM-ALBUTEROL 0.5-2.5 (3) MG/3ML IN SOLN: 3.0000 mL | Freq: Four times a day (QID) | RESPIRATORY_TRACT | Status: DC | PRN

## 2020-05-11 NOTE — Progress Notes (Signed)
Plan of care reviewed. Pt's progressing. Appeared alert and oriented x 4. He's hemodynamically stable. EKG- sinus rhythm with BBB on monitor, HR 70s - 90s, BP within normal limits, SPO2 95-99% on 2 LPM of O2 NCL. He refused CPAP tonight.   Auscultated lung sound had wheezing left upper and diminished left lower. Encouraged pulmonary hygiene. Able to use incentive spirometer and cough effectively. Breathing treatment given by RT per protocol.  Chest tube continuously suctioning with - 20 cmH2o. No air leak, negative for subcutaneous emphysema.   Sternal wound dry and clean, no drainage. Pain tolerated well with Morphine on PCA pump. No incidents of immediate distress noted tonight. We continue to monitor.  Filiberto Pinks, RN

## 2020-05-11 NOTE — Discharge Summary (Signed)
Physician Discharge Summary  Patient ID: Joshua Mack MRN: [id number] DOB/AGE: [birth year] 79 y.o.  Admit date: 05/08/2020 Discharge date: 05/13/2020  Admission Diagnoses:sternal drainage  Discharge Diagnoses:  Active Problems:   Pleural effusion   S/P CABG (coronary artery bypass graft)   Patient Active Problem List   Diagnosis Date Noted  . Pleural effusion 05/09/2020  . S/P CABG (coronary artery bypass graft) 05/09/2020  . Multiple vessel coronary artery disease: Acute mid LAD 100% occlusion, 90% RI and CTO of LCx and RCA 04/28/2020  . Acute combined systolic and diastolic heart failure (HCC) 04/28/2020  . 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 Acute ST elevation myocardial infarction (STEMI) of anterolateral wall (HCC)-likely delayed presentation 04/27/2020  . Essential hypertension 04/27/2020  . DVT (deep venous thrombosis) (HCC) 05/08/2017  . OSA (obstructive sleep apnea) 05/08/2017  . Hyponatremia 05/07/2017  . Respiratory failure with hypoxia (HCC) 05/07/2017  . Spinal stenosis at L4-L5 level 05/03/2017    History of Present Illness:      79 yo man is status post coronary bypass grafting by Dr. Cliffton Asters on 04/28/2020.  He was discharged in the hospital in good condition several days later.  He was last seen 3 days ago in the office by Dr. Cliffton Asters.  The patient was brought to the emergency department late last night by EMS due to sudden expression of fluid from his sternal incision.  He denies fevers or chills.  He has reported a cough over the last couple of days.  He denies sternal instability.  Furthermore, the patient denies angina or shortness of breath.   Discharged Condition: good  Hospital Course: Patient presented to the emergency department and was seen by Dr. Vickey Sages who placed a chest tube.  Initial evacuation of 1300 mL of serosanguineous fluid was obtained and the chest tube was kept in place.  A series of daily chest x-rays were obtained and drainage was monitored  closely.  He otherwise remained quite stable.  The chest tube was removed on 05/12/2020.  His creatinine is stable and slightly elevated from previous hospitalization.  He has been put on a course of IV Lasix.  His sternal incision appears to be quite stable in appearance.  At the time of discharge the patient is felt to be quite stable  Significant Diagnostic Studies: chest XRAY's  Treatments: chest tube placement  Discharge Exam: Blood pressure 105/70, pulse 92, temperature 98.7 F (37.1 C), temperature source Oral, resp. rate 17, height 5\' 10"  (1.778 m), weight 87.5 kg, SpO2 95 %.    General appearance: alert, cooperative and no distress Heart: regular rate and rhythm Lungs: clear to auscultation bilaterally Abdomen: soft, non-tender; bowel sounds normal; no masses,  no organomegaly Extremities: extremities normal, atraumatic, no cyanosis or edema Wound: clean and dry    Allergies as of 05/13/2020      Reactions   Clemastine    Fentanyl Other (See Comments)   Had a reaction during a colonoscopy in the past. He was told it required Benadryl.   Midazolam Other (See Comments)   Had a reaction during a colonoscopy in the past. He was told it required Benadryl.   Phenylpropanolamine    Amoxicillin Rash   Has patient had a PCN reaction causing immediate rash, facial/tongue/throat swelling, SOB or lightheadedness with hypotension: No Has patient had a PCN reaction causing severe rash involving mucus membranes or skin necrosis: No Has patient had a PCN reaction that required hospitalization: No Has patient had a PCN reaction occurring  within the last 10 years: No If all of the above answers are "NO", then may proceed with Cephalosporin use.   Other Itching, Rash, Other (See Comments)   Strong, perfume-y detergents    Tavist Nd [loratadine] Rash      Medication List    TAKE these medications   acetaminophen 500 MG tablet Commonly known as: TYLENOL Take 500-1,000 mg by mouth 2  (two) times daily as needed for mild pain or headache.   aspirin 81 MG EC tablet Take 1 tablet (81 mg total) by mouth daily.   atorvastatin 40 MG tablet Commonly known as: LIPITOR Take 1 tablet (40 mg total) by mouth daily.   Centrum Silver 50+Men Tabs Take 1 tablet by mouth daily with breakfast.   Chromium 1000 MCG Tabs Take 1,000 mcg by mouth daily.   clopidogrel 75 MG tablet Commonly known as: PLAVIX Take 1 tablet (75 mg total) by mouth daily.   colchicine 0.6 MG tablet Take 1 tablet (0.6 mg total) by mouth 2 (two) times daily.   dextromethorphan-guaiFENesin 30-600 MG 12hr tablet Commonly known as: MUCINEX DM Take 1 tablet by mouth 2 (two) times daily as needed for cough.   docusate sodium 100 MG capsule Commonly known as: Colace Take 1 capsule (100 mg total) by mouth 2 (two) times daily as needed for mild constipation.   ESTER C PO Take 500 mg by mouth daily.   furosemide 40 MG tablet Commonly known as: LASIX Take 1 tablet (40 mg total) by mouth daily.   Garlic 2440 MG Caps Take 1,000 mg by mouth daily.   Green Tea 250 MG Caps Take 250 mg by mouth at bedtime.   Krill Oil 1000 MG Caps Take 1,000 mg by mouth daily.   L-Arginine 500 MG Caps Take 500 mg by mouth daily.   lisinopril 10 MG tablet Commonly known as: ZESTRIL Take 1 tablet (10 mg total) by mouth daily.   melatonin 5 MG Tabs Take 5 mg by mouth at bedtime.   metoprolol tartrate 25 MG tablet Commonly known as: LOPRESSOR Take 1 tablet (25 mg total) by mouth 2 (two) times daily.   Osteo Bi-Flex Joint Shield Tabs Take 1 tablet by mouth daily.   Urinozinc Prostate Caps Take 1 capsule by mouth daily.   polyethylene glycol 17 g packet Commonly known as: MIRALAX / GLYCOLAX Take 17 g by mouth daily.   potassium chloride SA 20 MEQ tablet Commonly known as: KLOR-CON Take 1 tablet (20 mEq total) by mouth daily.   Preparation H 0.25-88.44 % suppository Generic drug: shark liver oil-cocoa  butter Place 1 suppository rectally as needed for hemorrhoids.   PRESCRIPTION MEDICATION CPAP- At bedtime and during times of rest   PROBIOTIC PO Take 1 capsule by mouth daily as needed (Constipation).   Resveratrol 250 MG Caps Take 250 mg by mouth daily.   traMADol 50 MG tablet Commonly known as: ULTRAM Take 1 tablet (50 mg total) by mouth every 4 (four) hours as needed for moderate pain.   Turmeric Curcumin 500 MG Caps Take 500 mg by mouth daily.      Follow-up Information    Lajuana Matte, MD Follow up on 05/22/2020.   Specialty: Cardiothoracic Surgery Why: Appointment is at 1:15 Contact information: [address] [place] [address] [phone]           Signed: Ellwood Handler PA-C 05/13/2020, 9:29 AM

## 2020-05-11 NOTE — Progress Notes (Signed)
      [address]       [place] [address]             [phone]      Subjective:  No new complaints.  Feels like he is breathing better after his chest tube has been placed.  Pain is controlled.  No BM   Objective: Vital signs in last 24 hours: Temp:  [97.6 F (36.4 C)-98.4 F (36.9 C)] 98.4 F (36.9 C) (05/24 0414) Pulse Rate:  [79-100] 89 (05/24 0425) Cardiac Rhythm: Normal sinus rhythm;Bundle branch block (05/24 0414) Resp:  [11-22] 22 (05/24 0425) BP: (102-124)/(72-88) 124/72 (05/24 0414) SpO2:  [91 %-100 %] 100 % (05/24 0425) FiO2 (%):  [28 %] 28 % (05/23 1513) Weight:  [89.7 kg] 89.7 kg (05/24 0425)  Intake/Output from previous day: 05/23 0701 - 05/24 0700 In: 570 [P.O.:500; I.V.:70] Out: 980 [Urine:900; Chest Tube:80]  General appearance: alert, cooperative and no distress Heart: regular rate and rhythm Lungs: diminished breath sounds bibasilar Abdomen: soft, non-tender; bowel sounds normal; no masses,  no organomegaly Extremities: trace edema, ecchymosis bilaterally Wound: clean and dry  Lab Results: Recent Labs    05/09/20 1439 05/11/20 0315  WBC 16.8* 14.6*  HGB 10.9* 9.3*  HCT 34.7* 30.0*  PLT 1,008* 857*   BMET:  Recent Labs    05/10/20 0428 05/11/20 0315  NA 136 136  K 4.2 4.2  CL 105 101  CO2 22 23  GLUCOSE 146* 117*  BUN 13 18  CREATININE 1.54* 1.58*  CALCIUM 8.1* 8.1*    PT/INR: No results for input(s): LABPROT, INR in the last 72 hours. ABG    Component Value Date/Time   PHART 7.459 (H) 04/30/2020 0255   HCO3 20.3 04/30/2020 0255   TCO2 21 (L) 04/30/2020 0255   ACIDBASEDEF 3.0 (H) 04/30/2020 0255   O2SAT 86.0 04/30/2020 0255   CBG (last 3)  No results for input(s): GLUCAP in the last 72 hours.  Assessment/Plan:  1. CV- hemodynamically stable in NSR on Lopressor, Lisinopril 2. Pulm- improvement in left pleural effusion, no air leak present, CT output 80 cc yesterday, will leave in place today if output  remains low can hopefully d/c tomorrow 3. Renal-creatinine elevated at 1.58 which is stable from yesterday but up since previous admission, currently on IV lasix, will continue  4. GI- constipation, lactulose prn 5. ID- afebrile, mild leukocytosis which is improving, no need for ABX at this time 6. Dispo- patient stable, improvement in CXR, continue diuretics, repeat BMET In AM.. may need to adjust lisinopril if creatinine rises, no signs of pneumonia   LOS: 2 days    Lowella Dandy, PA-C 05/11/2020

## 2020-05-11 NOTE — Discharge Instructions (Signed)
Pleural Effusion Pleural effusion is an abnormal buildup of fluid in the layers of tissue between the lungs and the inside of the chest (pleural space) The two layers of tissue that line the lungs and the inside of the chest are called pleura. Usually, there is no air in the space between the pleura, only a thin layer of fluid. Some conditions can cause a large amount of fluid to build up, which can cause the lung to collapse if untreated. A pleural effusion is usually caused by another disease that requires treatment. What are the causes? Pleural effusion can be caused by:  Heart failure.  Certain infections, such as pneumonia or tuberculosis.  Cancer.  A blood clot in the lung (pulmonary embolism).  Complications from surgery, such as from open heart surgery.  Liver disease (cirrhosis).  Kidney disease. What are the signs or symptoms? In some cases, pleural effusion may cause no symptoms. If symptoms are present, they may include:  Shortness of breath, especially when lying down.  Chest pain. This may get worse when taking a deep breath.  Fever.  Dry, long-lasting (chronic) cough.  Hiccups.  Rapid breathing. An underlying condition that is causing the pleural effusion (such as heart failure, pneumonia, blood clots, tuberculosis, or cancer) may also cause other symptoms. How is this diagnosed? This condition may be diagnosed based on:  Your symptoms and medical history.  A physical exam.  A chest X-ray.  A procedure to use a needle to remove fluid from the pleural space (thoracentesis). This fluid is tested.  Other imaging studies of the chest, such as ultrasound or CT scan. How is this treated? Depending on the cause of your condition, treatment may include:  Treating the underlying condition that is causing the effusion. When that condition improves, the effusion will also improve. Examples of treatment for underlying conditions include: ? Antibiotic medicines to  treat an infection. ? Diuretics or other heart medicines to treat heart failure.  Thoracentesis.  Placing a thin flexible tube under your skin and into your chest to continuously drain the effusion (indwelling pleural catheter).  Surgery to remove the outer layer of tissue from the pleural space (decortication).  A procedure to put medicine into the chest cavity to seal the pleural space and prevent fluid buildup (pleurodesis).  Chemotherapy and radiation therapy, if you have cancerous (malignant) pleural effusion. These treatments are typically used to treat cancer. They kill certain cells in the body. Follow these instructions at home:  Take over-the-counter and prescription medicines only as told by your health care provider.  Ask your health care provider what activities are safe for you.  Keep track of how long you are able to do mild exercise (such as walking) before you get short of breath. Write down this information to share with your health care provider. Your ability to exercise should improve over time.  Do not use any products that contain nicotine or tobacco, such as cigarettes and e-cigarettes. If you need help quitting, ask your health care provider.  Keep all follow-up visits as told by your health care provider. This is important. Contact a health care provider if:  The amount of time that you are able to do mild exercise: ? Decreases. ? Does not improve with time.  You have a fever. Get help right away if:  You are short of breath.  You develop chest pain.  You develop a new cough. Summary  Pleural effusion is an abnormal buildup of fluid in the layers   of tissue between the lungs and the inside of the chest.  Pleural effusion can have many causes, including heart failure, pulmonary embolism, infections, or cancer.  Symptoms of pleural effusion can include shortness of breath, chest pain, fever, long-lasting (chronic) cough, hiccups, or rapid  breathing.  Diagnosis often involves making images of the chest (such as with ultrasound or X-ray) and removing fluid (thoracentesis) to send for testing.  Treatment for pleural effusion depends on what underlying condition is causing it. This information is not intended to replace advice given to you by your health care provider. Make sure you discuss any questions you have with your health care provider. Document Revised: 11/17/2017 Document Reviewed: 08/10/2017 Elsevier Patient Education  2020 [employer]  

## 2020-05-12 ENCOUNTER — Inpatient Hospital Stay (HOSPITAL_COMMUNITY): Payer: Commercial Managed Care - PPO

## 2020-05-12 ENCOUNTER — Telehealth: Payer: Self-pay | Admitting: Physician Assistant

## 2020-05-12 LAB — BASIC METABOLIC PANEL
Anion gap: 10 (ref 5–15)
BUN: 20 mg/dL (ref 8–23)
CO2: 24 mmol/L (ref 22–32)
Calcium: 7.9 mg/dL — ABNORMAL LOW (ref 8.9–10.3)
Chloride: 103 mmol/L (ref 98–111)
Creatinine, Ser: 1.51 mg/dL — ABNORMAL HIGH (ref 0.61–1.24)
GFR calc Af Amer: 51 mL/min — ABNORMAL LOW (ref >60)
GFR calc non Af Amer: 44 mL/min — ABNORMAL LOW (ref >60)
Glucose, Bld: 104 mg/dL — ABNORMAL HIGH (ref 70–99)
Potassium: 4.1 mmol/L (ref 3.5–5.1)
Sodium: 137 mmol/L (ref 135–145)

## 2020-05-12 MED ORDER — POTASSIUM CHLORIDE CRYS ER 20 MEQ PO TBCR
20.0000 meq | EXTENDED_RELEASE_TABLET | Freq: Every day | ORAL | Status: DC
  Administered 2020-05-12 – 2020-05-13 (×2): 20 meq via ORAL
  Filled 2020-05-12 (×2): qty 1

## 2020-05-12 MED ORDER — FUROSEMIDE 40 MG PO TABS: 40.0000 mg | ORAL_TABLET | Freq: Every day | ORAL | 1 refills | Status: DC

## 2020-05-12 MED ORDER — FUROSEMIDE 40 MG PO TABS
40.0000 mg | ORAL_TABLET | Freq: Every day | ORAL | Status: DC
  Administered 2020-05-12 – 2020-05-13 (×2): 40 mg via ORAL
  Filled 2020-05-12 (×2): qty 1

## 2020-05-12 MED ORDER — COLCHICINE 0.6 MG PO TABS: 0.6000 mg | ORAL_TABLET | Freq: Two times a day (BID) | ORAL | 0 refills | Status: DC

## 2020-05-12 MED ORDER — LISINOPRIL 10 MG PO TABS: 10.0000 mg | ORAL_TABLET | Freq: Every day | ORAL | 3 refills | Status: DC

## 2020-05-12 NOTE — Progress Notes (Signed)
Chest tube removed per order. Pt tolerated well. VSS. Will continue to monitor.  Hazle Nordmann, RN

## 2020-05-12 NOTE — Telephone Encounter (Signed)
Patient's wife calling requesting she come with the patient to his appointment 05/15/2020, because she states he is still in the hospital and has not walked in days.

## 2020-05-12 NOTE — Progress Notes (Signed)
PCA pump discontinued. This RN wasted 15mL Morphine in steri cycle with Ginette Otto RN.  Hazle Nordmann, RN

## 2020-05-12 NOTE — Telephone Encounter (Signed)
Returned call to patient's wife left message on personal voice mail ok for you to come back at husband's appointment with Azalee Course PA 5/28 at 9:15 am.

## 2020-05-12 NOTE — Progress Notes (Signed)
Mobility Specialist: Progress Note   Pre-Mobility: 92 HR, 98% SpO2 Post-Mobility: 93 HR, 122/77 BP, 100% SpO2  Pt tolerated ambulation well. Pt started off a little wobbly but became more steady throughout ambulation. Pt started out moving a little fast. We slowed down which made him more steady. Pt had no c/o SOB or dizziness. Pt was positioned back in bed with family member in room.   [facility] Rosey Eide Mobility Specialist

## 2020-05-12 NOTE — Progress Notes (Addendum)
      [address]       [place] [address]             [phone]      Subjective:  No complaints.  States he feels good, doesn't hurt anywhere.  Feels like his breathing is much better.  Objective: Vital signs in last 24 hours: Temp:  [98.2 F (36.8 C)-99.1 F (37.3 C)] 98.7 F (37.1 C) (05/25 0307) Pulse Rate:  [77-102] 85 (05/25 0307) Cardiac Rhythm: Normal sinus rhythm (05/25 0514) Resp:  [11-24] 16 (05/25 0517) BP: (98-117)/(62-78) 112/75 (05/25 0307) SpO2:  [91 %-99 %] 94 % (05/25 0517) Weight:  [87.9 kg] 87.9 kg (05/25 0251)  Intake/Output from previous day: 05/24 0701 - 05/25 0700 In: 982.3 [P.O.:730; I.V.:252.3] Out: 20 [Chest Tube:20]  General appearance: alert, cooperative and no distress Heart: regular rate and rhythm Lungs: clear to auscultation bilaterally Abdomen: soft, non-tender; bowel sounds normal; no masses,  no organomegaly Extremities: extremities normal, atraumatic, no cyanosis or edema Wound: clean and dry  Lab Results: Recent Labs    05/09/20 1439 05/11/20 0315  WBC 16.8* 14.6*  HGB 10.9* 9.3*  HCT 34.7* 30.0*  PLT 1,008* 857*   BMET:  Recent Labs    05/11/20 0315 05/12/20 0325  NA 136 137  K 4.2 4.1  CL 101 103  CO2 23 24  GLUCOSE 117* 104*  BUN 18 20  CREATININE 1.58* 1.51*  CALCIUM 8.1* 7.9*    PT/INR: No results for input(s): LABPROT, INR in the last 72 hours. ABG    Component Value Date/Time   PHART 7.459 (H) 04/30/2020 0255   HCO3 20.3 04/30/2020 0255   TCO2 21 (L) 04/30/2020 0255   ACIDBASEDEF 3.0 (H) 04/30/2020 0255   O2SAT 86.0 04/30/2020 0255   CBG (last 3)  No results for input(s): GLUCAP in the last 72 hours.  Assessment/Plan:  1. CV- NSR with occasional PACs- continue Lisinopril, Lopressor 2. Pulm- CT output 20 cc yesterday, CXR ordered if okay will remove chest tube today 3. Renal- creatinine stable, weight is below admission, will transition to oral Lasix today 4. GI- constipation  resolved 5. ID- remains afebrile, leukocytosis was improving, no need for ABX 6. Dispo- patient stable, minimal CT output yesterday, will await CXR today if no issues, will d/c chest tube, possibly d/c home this afternoon vs tomorrow.    LOS: 3 days    Joshua Dandy, PA-C  05/12/2020  Doing better Minimal CT output Will remove CT Home today Will f/u with cardiology this week  Joshua Mack

## 2020-05-12 NOTE — Progress Notes (Signed)
Plan of care reviewed. Pt's progressing. He's hemodynamically stable. Remained afebrile. Sinus rhythm on monitor with occationaly PAC. BP within normal limits. SPO2 94-97% on room air and CPAP at night. No respiratory distress.   Auscultated breath sound diminished with rhonchi lower lobe bilaterally. Encouraged pulmonary hygiene and deep breathing exercise with incentive spirometer got 1250 ml effectively. Chest tube with -20 cmH2o suction, total drainage marked output at 20 ml in the past 24 hours.  No air leak, no subcutaneous emphysema.  Denied pain, Pt barely used PCA pump in the past 24 hours. No incidents on immediate distress noted tonight. We will continue to monitor.   Filiberto Pinks, RN

## 2020-05-13 LAB — BASIC METABOLIC PANEL
Anion gap: 12 (ref 5–15)
BUN: 20 mg/dL (ref 8–23)
CO2: 24 mmol/L (ref 22–32)
Calcium: 8.1 mg/dL — ABNORMAL LOW (ref 8.9–10.3)
Chloride: 101 mmol/L (ref 98–111)
Creatinine, Ser: 1.4 mg/dL — ABNORMAL HIGH (ref 0.61–1.24)
GFR calc Af Amer: 55 mL/min — ABNORMAL LOW (ref >60)
GFR calc non Af Amer: 48 mL/min — ABNORMAL LOW (ref >60)
Glucose, Bld: 98 mg/dL (ref 70–99)
Potassium: 3.7 mmol/L (ref 3.5–5.1)
Sodium: 137 mmol/L (ref 135–145)

## 2020-05-13 NOTE — Progress Notes (Signed)
      [address]       [place] [address]             [phone]    Subjective:  No new complaints.  Thought he was going home yesterday.  Objective: Vital signs in last 24 hours: Temp:  [97.7 F (36.5 C)-98.8 F (37.1 C)] 98.3 F (36.8 C) (05/26 0434) Pulse Rate:  [75-94] 75 (05/26 0434) Cardiac Rhythm: Normal sinus rhythm (05/26 0445) Resp:  [15-18] 18 (05/26 0434) BP: (86-115)/(52-79) 86/52 (05/26 0434) SpO2:  [95 %-99 %] 97 % (05/26 0434) Weight:  [87.5 kg] 87.5 kg (05/26 0435)  Intake/Output from previous day: 05/25 0701 - 05/26 0700 In: 97.7 [I.V.:97.7] Out: 950 [Urine:950]  General appearance: alert, cooperative and no distress Heart: regular rate and rhythm Lungs: clear to auscultation bilaterally Abdomen: soft, non-tender; bowel sounds normal; no masses,  no organomegaly Extremities: extremities normal, atraumatic, no cyanosis or edema Wound: clean and dry  Lab Results: Recent Labs    05/11/20 0315  WBC 14.6*  HGB 9.3*  HCT 30.0*  PLT 857*   BMET:  Recent Labs    05/12/20 0325 05/13/20 0320  NA 137 137  K 4.1 3.7  CL 103 101  CO2 24 24  GLUCOSE 104* 98  BUN 20 20  CREATININE 1.51* 1.40*  CALCIUM 7.9* 8.1*    PT/INR: No results for input(s): LABPROT, INR in the last 72 hours. ABG    Component Value Date/Time   PHART 7.459 (H) 04/30/2020 0255   HCO3 20.3 04/30/2020 0255   TCO2 21 (L) 04/30/2020 0255   ACIDBASEDEF 3.0 (H) 04/30/2020 0255   O2SAT 86.0 04/30/2020 0255   CBG (last 3)  No results for input(s): GLUCAP in the last 72 hours.  Assessment/Plan:  1.CV- NSR, + hypotension on Lopressor, will decrease Zestril to 5 mg daily 2. Pulm- chest tube removed, no pneumothorax 3. Renal- creatinine stable, weight is trending,  Continue Lasix, potassium 4. Dispo- patient stable, will d/c home today   LOS: 4 days    Lowella Dandy, PA-C  05/13/2020

## 2020-05-13 NOTE — Progress Notes (Signed)
Discharge AVS meds take and those due reviewed with pt. Follow up appointments and when to call MD reviewed. All questions and concerns addressed. No further questions at this time. D/c IV and TELE, CCMD notified. D/C home per orders. Melayah Skorupski N Tyresa Prindiville, RN  

## 2020-05-14 LAB — CULTURE, BLOOD (ROUTINE X 2)
Culture: NO GROWTH
Culture: NO GROWTH
Special Requests: ADEQUATE

## 2020-05-14 NOTE — Discharge Summary (Signed)
Physician Discharge Summary  Patient ID: Joshua Mack MRN: [id number] DOB/AGE: Apr 29, 1941 79 y.o.  Admit date: 04/27/2020 Discharge date: 05/14/2020  Admission Diagnoses:sternal drainage  Discharge Diagnoses:  Principal Problem:   Acute ST elevation myocardial infarction (STEMI) of anterolateral wall (HCC)-likely delayed presentation Active Problems:   Spinal stenosis at L4-L5 level   Respiratory failure with hypoxia (HCC)   OSA (obstructive sleep apnea)   Essential hypertension   Multiple vessel coronary artery disease: Acute mid LAD 100% occlusion, 90% RI and CTO of LCx and RCA   Acute combined systolic and diastolic heart failure (HCC)   S/P CABG x 4   Patient Active Problem List   Diagnosis Date Noted  . Pleural effusion 05/09/2020  . S/P CABG (coronary artery bypass graft) 05/09/2020  . Multiple vessel coronary artery disease: Acute mid LAD 100% occlusion, 90% RI and CTO of LCx and RCA 04/28/2020  . Acute combined systolic and diastolic heart failure (HCC) 04/28/2020  . 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 Acute ST elevation myocardial infarction (STEMI) of anterolateral wall (HCC)-likely delayed presentation 04/27/2020  . Essential hypertension 04/27/2020  . DVT (deep venous thrombosis) (HCC) 05/08/2017  . OSA (obstructive sleep apnea) 05/08/2017  . Hyponatremia 05/07/2017  . Respiratory failure with hypoxia (HCC) 05/07/2017  . Spinal stenosis at L4-L5 level 05/03/2017    History of Present Illness:      79 yo man is status post coronary bypass grafting by Dr. Cliffton Asters on 04/28/2020.  He was discharged in the hospital in good condition several days later.  He was last seen 3 days ago in the office by Dr. Cliffton Asters.  The patient was brought to the emergency department late last night by EMS due to sudden expression of fluid from his sternal incision.  He denies fevers or chills.  He has reported a cough over the last couple of days.  He denies sternal instability.  Furthermore,  the patient denies angina or shortness of breath.   Discharged Condition: good  Hospital Course: Patient presented to the emergency department and was seen by Dr. Vickey Sages who placed a chest tube.  Initial evacuation of 1300 mL of serosanguineous fluid was obtained and the chest tube was kept in place.  A series of daily chest x-rays were obtained and drainage was monitored closely.  He otherwise remained quite stable.  The chest tube was removed on 05/12/2020.  His creatinine is stable and slightly elevated from previous hospitalization.  He has been put on a course of IV Lasix.  His sternal incision appears to be quite stable in appearance.  At the time of discharge the patient is felt to be quite stable  Significant Diagnostic Studies: chest XRAY's  Treatments: chest tube placement  Discharge Exam: Blood pressure 108/69, pulse 93, temperature 98.6 F (37 C), temperature source Oral, resp. rate 20, height 5\' 10"  (1.778 m), weight 94.2 kg, SpO2 94 %.    General appearance: alert, cooperative and no distress Heart: regular rate and rhythm Lungs: clear to auscultation bilaterally Abdomen: soft, non-tender; bowel sounds normal; no masses,  no organomegaly Extremities: extremities normal, atraumatic, no cyanosis or edema Wound: clean and dry   Discharge Instructions    Amb Referral to Cardiac Rehabilitation   Complete by: As directed    Diagnosis:  CABG STEMI     CABG X ___: 4   After initial evaluation and assessments completed: Virtual Based Care may be provided alone or in conjunction with Phase 2 Cardiac Rehab based on patient  barriers.: Yes     Allergies as of 05/03/2020      Reactions   Fentanyl Other (See Comments)   Had a reaction during a colonoscopy in the past. He was told it required Benadryl.   Midazolam Other (See Comments)   Had a reaction during a colonoscopy in the past. He was told it required Benadryl.   Amoxicillin Rash   Has patient had a PCN reaction causing  immediate rash, facial/tongue/throat swelling, SOB or lightheadedness with hypotension: No Has patient had a PCN reaction causing severe rash involving mucus membranes or skin necrosis: No Has patient had a PCN reaction that required hospitalization: No Has patient had a PCN reaction occurring within the last 10 years: No If all of the above answers are "NO", then may proceed with Cephalosporin use.   Other Itching, Rash, Other (See Comments)   Strong, perfume-y detergents    Tavist Nd [loratadine] Rash      Medication List    STOP taking these medications   amLODipine 10 MG tablet Commonly known as: NORVASC   ibuprofen 200 MG tablet Commonly known as: ADVIL   naproxen sodium 220 MG tablet Commonly known as: ALEVE     TAKE these medications   acetaminophen 500 MG tablet Commonly known as: TYLENOL Take 500-1,000 mg by mouth 2 (two) times daily as needed for mild pain or headache.   aspirin 81 MG EC tablet Take 1 tablet (81 mg total) by mouth daily. What changed:   medication strength  how much to take  when to take this  reasons to take this   atorvastatin 40 MG tablet Commonly known as: LIPITOR Take 1 tablet (40 mg total) by mouth daily.   Centrum Silver 50+Men Tabs Take 1 tablet by mouth daily with breakfast. Notes to patient: Take as you were at home.   Chromium 1000 MCG Tabs Take 1,000 mcg by mouth daily. Notes to patient: Take as you were at home.   clopidogrel 75 MG tablet Commonly known as: PLAVIX Take 1 tablet (75 mg total) by mouth daily.   dextromethorphan-guaiFENesin 30-600 MG 12hr tablet Commonly known as: MUCINEX DM Take 1 tablet by mouth 2 (two) times daily as needed for cough.   docusate sodium 100 MG capsule Commonly known as: Colace Take 1 capsule (100 mg total) by mouth 2 (two) times daily as needed for mild constipation.   Garlic 7169 MG Caps Take 1,000 mg by mouth daily.   Green Tea 250 MG Caps Take 250 mg by mouth at bedtime.    Krill Oil 1000 MG Caps Take 1,000 mg by mouth daily. Notes to patient: Take as you were at home.   L-Arginine 500 MG Caps Take 500 mg by mouth daily. Notes to patient: Take as you were at home.   melatonin 5 MG Tabs Take 5 mg by mouth at bedtime. Notes to patient: Take as you were at home.   metoprolol tartrate 25 MG tablet Commonly known as: LOPRESSOR Take 1 tablet (25 mg total) by mouth 2 (two) times daily.   Osteo Bi-Flex Joint Shield Tabs Take 1 tablet by mouth daily. Notes to patient: Take as you were at home.   Urinozinc Prostate Caps Take 1 capsule by mouth daily. Notes to patient: Take as you were at home.   polyethylene glycol 17 g packet Commonly known as: MIRALAX / GLYCOLAX Take 17 g by mouth daily. Notes to patient: Take as you were at home.   PRESCRIPTION MEDICATION CPAP- At  bedtime and during times of rest Notes to patient: Take as you were at home.   Resveratrol 250 MG Caps Take 250 mg by mouth daily. Notes to patient: Take as you were at home.   traMADol 50 MG tablet Commonly known as: ULTRAM Take 1 tablet (50 mg total) by mouth Mack 4 (four) hours as needed for moderate pain.   Turmeric Curcumin 500 MG Caps Take 500 mg by mouth daily.      Follow-up Information    Coniglio, Harlen Labs, MD Follow up.   Specialty: Internal Medicine Why: Please see discharge paperwork for 2-week follow-up with cardiology. Contact information: [address] [place] [address] [phone]        Corliss Skains, MD Follow up.   Specialty: Cardiothoracic Surgery Why: You have an appointment to see Dr. Cliffton Asters on 05/07/2020 at 10:15 AM.  Please obtain a chest x-ray at 9:45 AM at [facility] imaging located in the same office complex on the first floor. Contact information: [address] [place] [address] [phone]           Signed: Corliss Skains PA-C 05/14/2020, 4:47 PM

## 2020-05-15 ENCOUNTER — Telehealth: Payer: Self-pay | Admitting: Physician Assistant

## 2020-05-15 ENCOUNTER — Ambulatory Visit (INDEPENDENT_AMBULATORY_CARE_PROVIDER_SITE_OTHER): Payer: Commercial Managed Care - PPO | Admitting: Physician Assistant

## 2020-05-15 ENCOUNTER — Encounter: Payer: Self-pay | Admitting: Physician Assistant

## 2020-05-15 ENCOUNTER — Other Ambulatory Visit: Payer: Self-pay

## 2020-05-15 VITALS — BP 102/66 | HR 72 | Temp 97.0°F | Ht 70.0 in | Wt 192.0 lb

## 2020-05-15 DIAGNOSIS — I959 Hypotension, unspecified: Secondary | ICD-10-CM | POA: Diagnosis not present

## 2020-05-15 DIAGNOSIS — Z79899 Other long term (current) drug therapy: Secondary | ICD-10-CM | POA: Diagnosis not present

## 2020-05-15 DIAGNOSIS — I5042 Chronic combined systolic (congestive) and diastolic (congestive) heart failure: Secondary | ICD-10-CM

## 2020-05-15 DIAGNOSIS — I25810 Atherosclerosis of coronary artery bypass graft(s) without angina pectoris: Secondary | ICD-10-CM

## 2020-05-15 DIAGNOSIS — E785 Hyperlipidemia, unspecified: Secondary | ICD-10-CM

## 2020-05-15 DIAGNOSIS — I10 Essential (primary) hypertension: Secondary | ICD-10-CM

## 2020-05-15 MED ORDER — METOPROLOL SUCCINATE ER 25 MG PO TB24
25.0000 mg | ORAL_TABLET | Freq: Every day | ORAL | 3 refills | Status: DC
Start: 2020-05-15 — End: 2020-05-21

## 2020-05-15 MED FILL — Morphine Sulfate Inj PF 10 MG/ML: INTRAMUSCULAR | Qty: 5 | Status: AC

## 2020-05-15 NOTE — Telephone Encounter (Signed)
Transferred call to Azalee Course, PA-C to speak with patient's wife.

## 2020-05-15 NOTE — Telephone Encounter (Signed)
Pt c/o BP issue: STAT if pt c/o blurred vision, one-sided weakness or slurred speech  1. What are your last 5 BP readings? 88/55 HR 86  2. Are you having any other symptoms (ex. Dizziness, headache, blurred vision, passed out)? Cold hands  3. What is your BP issue? Patient's wife states the patient saw Wynema Birch today and his BP was low. She states they were told to call if the patient's BP got low again.

## 2020-05-15 NOTE — Patient Instructions (Addendum)
Medication Instructions:   STOP METOPROLOL TARTARTE   START METOPROLOL SUCCINATE(TOPROL-XL), TAKE ONLY IF BLOOD PRESSURE IS GREATER THAN 105 ON THE TOP   HOLD LASIX, LISINOPRIL, POTASSIUM UNTIL NEXT APPOINTMENT  *If you need a refill on your cardiac medications before your next appointment, please call your pharmacy*  Lab Work: Your physician recommends that you return for lab work TODAY:   BMET   CBC  If you have labs (blood work) drawn today and your tests are completely normal, you will receive your results only by:  [employer] (if you have MyChart) OR A paper copy in the mail.  If you have any lab test that is abnormal or we need to change your treatment, we will call you to review the results. You may go to any [facility] that is convenient for you however, we do have a lab in our office that is able to assist you. You DO NOT need an appointment for our lab. The lab is open 8:00am and closes at 4:00pm. Lunch 12:45 - 1:45pm.  Special Instructions TAKE AND LOG YOUR BLOOD PRESSURE BEFORE TAKING YOUR METOPROLOL SUCCINATE   Follow-Up: Your next appointment:  1 week(s)- Thursday 05-21-20  The format for your next appointment:   In Person   Provider: [name], PA-C  At [facility], you and your health needs are our priority.  As part of our continuing mission to provide you with exceptional heart care, we have created designated Provider Care Teams.  These Care Teams include your primary Cardiologist (physician) and Advanced Practice Providers (APPs -  Physician Assistants and Nurse Practitioners) who all work together to provide you with the care you need, when you need it.

## 2020-05-15 NOTE — Telephone Encounter (Signed)
Spoke with patient's wife. BP dipped again at home, after oral hydration, SBP now in the 90s. Instructed the patient to lay still and continue oral hydration, if SBP drop again or if he is feeling worse, he is to immediately call 911 and have EMS bring him to the ED. Otherwise, he does not have any significant dizziness or presyncope at this time

## 2020-05-15 NOTE — Progress Notes (Signed)
Cardiology Office Note:    Date:  05/15/2020   ID:  Joshua Mack, DOB 10/20/1941, MRN [id number]  PCP:  Joshua Frees, MD  Cardiologist:  Joshua Martinique, MD  Electrophysiologist:  None   Referring MD: Joshua Frees, MD   Chief Complaint  Patient presents with  . Hospitalization Follow-up    recent CABG. Seen for Dr. Martinique.    History of Present Illness:    Joshua Mack is a 79 y.Mack. male with a hx of HTN, HLD, spinal stenosis, prior DVT and recently diagnosed CAD.  Patient presented to the hospital on 04/27/2020 for chest pain and shortness of breath and found to have a STEMI. Urgent cardiac catheterization performed on 04/24/2020 showed 100% mid LAD occlusion after a large D1 with faint right to left collateral, 90% ramus intermediate, 100% CTO of mid left circumflex with good left to left collaterals, 100% CTO of proximal RCA with right to right and left to right collaterals, LV gram was unable to assess LV function due to underfilling.  There was also moderately elevated LV filling pressure with preserved cardiac output of 4.4 L/min.  Patient was placed on IVBP support.  Echocardiogram obtained on the following day showed EF 30 to 35%, grade 2 DD.  Patient eventually underwent urgent CABG x4 with LIMA to LAD, SVG to PDA, SVG to D1 and SVG to OM 3 by Dr. Kipp Mack.  Intra-aortic balloon pump was removed during the procedure.  He did have postoperative ileus however bowel function resumed over time.  He did not have significant cardiac arrhythmia in the postoperative course.  He did have appropriate postsurgical anemia.  Since discharge, patient returned to the ED on 5/17 after a brief unresponsive episode.  He returned to the hospital again on 5/22 due to sudden expression of fluid from his sternal incision.  He was seen by Dr. Julien Mack who placed the chest tube and they removed 1300 mL of serosanguineous fluid.  Chest tube was later removed on 5/25.  Repeat limited echocardiogram obtained on  05/09/2020 showed EF 30 to 35%, trivial mitral regurgitation, mild AI.  Patient presents today for follow-up along with his wife.  He denies any chest pain and he says his shortness of breath has significantly improved after recent chest tube placement.  Blood pressure on arrival was 88/56 by manual recheck, initial blood pressure by digital cuff was 90/62.  O2 saturation was 95%.  Overall he has been feeling well despite a low blood pressure.  While talking to the patient, he became diaphoretic and weak.  We laid him down.  Due to persistently low blood pressure, we started IV fluid in the cardiology office.  I will hold his lisinopril, Lasix and potassium supplement at this time.  We may restart this 3 medications in the future at a lower dose.  I discontinued his metoprolol tartrate and recommended switch to metoprolol succinate 25 mg daily.  He is only to take metoprolol succinate tomorrow if his systolic blood pressures > 105 mmHg.  I discussed the case with DOD Dr. Claiborne Mack.  His EKG still shows minimal ST elevation in the anterior leads are likely related to the recent STEMI.  We will obtain CBC and BMP today and I want the patient to be seen within 7 days back in the clinic.  Note, for some reason he was discharged on colchicine 0.6 mg twice daily.  However recent office note did not mention anything such as pericarditis or gout.  I am not  entirely sure why this medication was prescribed but I suspect this is related to the pleural effusion and colchicine's anti-inflammatory properties.  I recommend the patient discuss this with CT surgery regarding whether he needs to take this medication and for how long.  Usually for pericarditis we recommend the colchicine for only 3 months, however again I do not see a diagnosis of pericarditis recently.   Note, patient was kept in the clinic for longer than 2 hours due to significant hypotension requiring IV fluid.  Systolic blood pressure improved to 106 prior to  discharge.  Past Medical History:  Diagnosis Date  . Complication of anesthesia    " I had a reaction to versed "  . Coronary artery disease   . High cholesterol   . Hypertension   . Sleep apnea     Past Surgical History:  Procedure Laterality Date  . CARDIAC SURGERY    . CHEST TUBE INSERTION Left 05/09/2020  . CORONARY ARTERY BYPASS GRAFT N/A 04/28/2020   Procedure: CORONARY ARTERY BYPASS GRAFTING (CABG) times four using LIMA to LAD; bilateral endoscopic greater saphenous vein harvest: SVG to Circ; SVG to RAMUS; and SVG to PDA.;  Surgeon: Joshua Mack, Joshua O, MD;  [facility];  Service: Open Heart Surgery;  Laterality: N/A;  . ENDOVEIN HARVEST OF GREATER SAPHENOUS VEIN Bilateral 04/28/2020   Procedure: Mack GuiseEndovein Harvest Of Greater Saphenous Vein;  Surgeon: Joshua Mack, Joshua O, MD;  [facility];  Service: Open Heart Surgery;  Laterality: Bilateral;  . IABP INSERTION N/A 04/27/2020   Procedure: IABP Insertion;  Surgeon: SwazilandJordan, Joshua M, MD;  [facility];  Service: Cardiovascular;  Laterality: N/A;  . LUMBAR LAMINECTOMY/DECOMPRESSION MICRODISCECTOMY N/A 05/03/2017   Procedure: Microlumbar decompression L4-5, L3-4,  L5-S1 WITH LATERAL AUTOGRAFT FUSION;  Surgeon: Joshua EveryBeane, Jeffrey, MD;  Location: [facility] ORS;  Service: Orthopedics;  Laterality: N/A;  . RIGHT/LEFT HEART CATH AND CORONARY ANGIOGRAPHY N/A 04/27/2020   Procedure: RIGHT/LEFT HEART CATH AND CORONARY ANGIOGRAPHY;  Surgeon: SwazilandJordan, Joshua M, MD;  [facility];  Service: Cardiovascular;  Laterality: N/A;    Current Medications: No outpatient medications have been marked as taking for the 05/15/20 encounter (Office Visit) with Azalee CourseMeng, Dany Harten, PA.     Allergies:   Clemastine, Fentanyl, Midazolam, Phenylpropanolamine, Amoxicillin, Other, and Tavist nd [loratadine]   Social History   Socioeconomic History  . Marital status: Married    Spouse name: Not on file  . Number of children: Not on file  . Years of  education: Not on file  . Highest education level: Not on file  Occupational History  . Not on file  Tobacco Use  . Smoking status: Never Smoker  . Smokeless tobacco: Never Used  Substance and Sexual Activity  . Alcohol use: No  . Drug use: No  . Sexual activity: Not on file  Other Topics Concern  . Not on file  Social History Narrative  . Not on file   Social Determinants of Health   Financial Resource Strain:   . Difficulty of Paying Living Expenses:   Food Insecurity:   . Worried About Programme researcher, broadcasting/film/videounning Out of Food in the Last Year:   . Baristaan Out of Food in the Last Year:   Transportation Needs:   . Freight forwarderLack of Transportation (Medical):   Marland Kitchen. Lack of Transportation (Non-Medical):   Physical Activity:   . Days of Exercise per Week:   . Minutes of Exercise per Session:   Stress:   . Feeling of  Stress :   Social Connections:   . Frequency of Communication with Friends and Family:   . Frequency of Social Gatherings with Friends and Family:   . Attends Religious Services:   . Active Member of Clubs or Organizations:   . Attends Banker Meetings:   Marland Kitchen Marital Status:      Family History: The patient's family history is not on file.  ROS:   Please see the history of present illness.     All other systems reviewed and are negative.  EKGs/Labs/Other Studies Reviewed:    The following studies were reviewed today:  Cath 04/27/2020  Prox RCA lesion is 100% stenosed.  Prox Cx to Mid Cx lesion is 100% stenosed.  Ramus lesion is 90% stenosed.  Mid LAD lesion is 100% stenosed.  LV end diastolic pressure is moderately elevated.   1. 3 vessel occlusive CAD.    - 100% mid LAD after a large diagonal branch. Faint right to left collaterals.    - 90% ramus intermediate    - 100% CTO of the mid LCx with good left to left collaterals    - 100% CTO of the proximal RCA. Good right to right and left to right collaterals. 2. Difficult to assess LV function due to underfilling.  Will assess with Echo 3. Moderately elevated LV filling pressures. Improved with IV lasix and IABP 4. Normal right heart pressures 5. Preserved cardiac output 4.4 liters/min  Plan: transfer to ICU. Heparinize. IABP support. CT surgery consultation for CABG. Assess LV function with Echo. Continue diuresis. Trend troponin levels.    Echo 04/28/2020 IMPRESSIONS    1. No intracavitary thrombus is seen. The dyskinetic anteroapical area is  not thinned or hyperechoic, suggesting recent ischemia/infarction. Left  ventricular ejection fraction, by estimation, is 30 to 35%. The left  ventricle has moderate to severely  decreased function. The left ventricle demonstrates regional wall motion  abnormalities (see scoring diagram/findings for description). Left  ventricular diastolic parameters are consistent with Grade II diastolic  dysfunction (pseudonormalization).  Elevated left atrial pressure.  2. Right ventricular systolic function is normal. The right ventricular  size is normal. A catheter or pacemaker wire is seen in the right  ventricle. is visualized.  3. The mitral valve is normal in structure. No evidence of mitral valve  regurgitation.  4. The aortic valve is normal in structure. Aortic valve regurgitation is  not visualized.   EKG:  EKG is ordered today.  The ekg ordered today demonstrates normal sinus rhythm, T wave inversion in the anterior leads, poor R wave progression in the anterior leads suggestive of anterior infarct.  T wave inversion in lead I and aVL as well.  Recent Labs: 04/27/2020: ALT 30; B Natriuretic Peptide 103.3 04/29/2020: Magnesium 2.6 05/11/2020: Hemoglobin 9.3; Platelets 857 05/13/2020: BUN 20; Creatinine, Ser 1.40; Potassium 3.7; Sodium 137  Recent Lipid Panel    Component Value Date/Time   CHOL 280 (H) 04/27/2020 2052   TRIG 149 04/27/2020 2052   HDL 57 04/27/2020 2052   CHOLHDL 4.9 04/27/2020 2052   VLDL 30 04/27/2020 2052   LDLCALC NOT  CALCULATED 04/27/2020 2052    Physical Exam:    VS:  BP 102/66 (BP Location: Left Arm, Patient Position: Supine, Cuff Size: Normal) Comment: Sindhu Nguyen  Pulse 72   Temp (!) 97 F (36.1 C)   Ht 5\' 10"  (1.778 Mack)   Wt 192 lb (87.1 kg)   SpO2 91%   BMI 27.55 kg/Mack  Wt Readings from Last 3 Encounters:  05/15/20 192 lb (87.1 kg)  05/13/20 192 lb 12.8 oz (87.5 kg)  05/07/20 209 lb (94.8 kg)     GEN:  Well nourished, well developed in no acute distress HEENT: Normal NECK: No JVD; No carotid bruits LYMPHATICS: No lymphadenopathy CARDIAC: RRR, no murmurs, rubs, gallops.  Sternotomy site is clean, dry, no significant drainage.  Chest tube incision under the left breast covered with dressing. RESPIRATORY:  Clear to auscultation without rales, wheezing or rhonchi.  Decreased breath sounds in the right base, however this area seems to be very small and does not suggest anything other than mild pleural effusion. ABDOMEN: Soft, non-tender, non-distended MUSCULOSKELETAL:  No edema; No deformity  SKIN: Warm and dry NEUROLOGIC:  Alert and oriented x 3 PSYCHIATRIC:  Normal affect   ASSESSMENT:    1. Hypotension, unspecified hypotension type   2. Medication management   3. Chronic combined systolic and diastolic CHF (congestive heart failure) (HCC)   4. Coronary artery disease involving coronary bypass graft of native heart without angina pectoris   5. Essential hypertension   6. Hyperlipidemia LDL goal <70    PLAN:    In order of problems listed above:  1. Hypotension: His blood pressure is quite low today.  However according to family member, his blood pressure at home recently has been in the low 100 range.  He denies any chest pain or shortness of breath.  Halfway through the interview, he was diaphoretic and it was quite weak therefore we started him on 500 mL of IV fluid in the clinic.  He has had a significant weight loss recently, I decided to stop his Lasix, potassium, lisinopril, and  metoprolol tartrate.  I did plan to start metoprolol succinate tomorrow, however instructed the patient to hold off on the blood pressure medication if his systolic blood pressure is less than 105.  2. CAD s/p CABG: Sternotomy site is well-healed. Continue aspirin and Plavix.  3. Pleural effusion: Recently underwent chest tube placement.  On physical exam, he does not have any significant pleural effusion.  4. Chronic combined systolic and diastolic heart failure: I am concerned he might be getting too dry with the hypotension today.  We will give him some IV fluid in the clinic.  I decided to hold off on his 40 mg Lasix and his potassium supplement.  We may restart Lasix at a later time however likely added a much lower dose.  He will need to restart heart failure medication in the future, however those blood pressure medication is offering more harm than good at this time.  Therefore I stopped his lisinopril and metoprolol tartrate.  I want to start him on metoprolol succinate tomorrow however recommended the patient only take the medication if systolic blood pressures greater than 105.  In the future, we may restart ACE inhibitor or ARB once blood pressure improve.  5. Hypertension: See #1.  His primary issue today is hypotension  6. Hyperlipidemia: On Lipitor 40 mg daily.   Medication Adjustments/Labs and Tests Ordered: Current medicines are reviewed at length with the patient today.  Concerns regarding medicines are outlined above.  Orders Placed This Encounter  Procedures  . CBC  . Basic metabolic panel  . EKG 12-Lead   Meds ordered this encounter  Medications  . metoprolol succinate (TOPROL XL) 25 MG 24 hr tablet    Sig: Take 1 tablet (25 mg total) by mouth daily. ONLY TAKE IF SBP >105  Dispense:  30 tablet    Refill:  3    Patient Instructions  Medication Instructions:   STOP METOPROLOL TARTARTE   START METOPROLOL SUCCINATE(TOPROL-XL), TAKE ONLY IF BLOOD PRESSURE IS  GREATER THAN 105 ON THE TOP   HOLD LASIX, LISINOPRIL, POTASSIUM UNTIL NEXT APPOINTMENT  *If you need a refill on your cardiac medications before your next appointment, please call your pharmacy*  Lab Work: Your physician recommends that you return for lab work TODAY:   BMET   CBC  If you have labs (blood work) drawn today and your tests are completely normal, you will receive your results only by:  [employer] (if you have MyChart) OR A paper copy in the mail.  If you have any lab test that is abnormal or we need to change your treatment, we will call you to review the results. You may go to any [facility] that is convenient for you however, we do have a lab in our office that is able to assist you. You DO NOT need an appointment for our lab. The lab is open 8:00am and closes at 4:00pm. Lunch 12:45 - 1:45pm.  Special Instructions TAKE AND LOG YOUR BLOOD PRESSURE BEFORE TAKING YOUR METOPROLOL SUCCINATE   Follow-Up: Your next appointment:  1 week(s)- Thursday 05-21-20  The format for your next appointment:   In Person   Provider: [name], PA-C  At [facility], you and your health needs are our priority.  As part of our continuing mission to provide you with exceptional heart care, we have created designated Provider Care Teams.  These Care Teams include your primary Cardiologist (physician) and Advanced Practice Providers (APPs -  Physician Assistants and Nurse Practitioners) who all work together to provide you with the care you need, when you need it.     Ramond Dial, [place]  05/15/2020 5:28 PM    [facility]

## 2020-05-16 ENCOUNTER — Telehealth: Payer: Self-pay | Admitting: Physician Assistant

## 2020-05-16 LAB — CBC
Hematocrit: 31 % — ABNORMAL LOW (ref 37.5–51.0)
Hemoglobin: 10.2 g/dL — ABNORMAL LOW (ref 13.0–17.7)
MCH: 28.9 pg (ref 26.6–33.0)
MCHC: 32.9 g/dL (ref 31.5–35.7)
MCV: 88 fL (ref 79–97)
Platelets: 792 10*3/uL — ABNORMAL HIGH (ref 150–450)
RBC: 3.53 x10E6/uL — ABNORMAL LOW (ref 4.14–5.80)
RDW: 13.1 % (ref 11.6–15.4)
WBC: 9.3 10*3/uL (ref 3.4–10.8)

## 2020-05-16 LAB — BASIC METABOLIC PANEL
BUN/Creatinine Ratio: 16 (ref 10–24)
BUN: 21 mg/dL (ref 8–27)
CO2: 22 mmol/L (ref 20–29)
Calcium: 9.1 mg/dL (ref 8.6–10.2)
Chloride: 98 mmol/L (ref 96–106)
Creatinine, Ser: 1.32 mg/dL — ABNORMAL HIGH (ref 0.76–1.27)
GFR calc Af Amer: 59 mL/min/{1.73_m2} — ABNORMAL LOW (ref >59)
GFR calc non Af Amer: 51 mL/min/{1.73_m2} — ABNORMAL LOW (ref >59)
Glucose: 68 mg/dL (ref 65–99)
Potassium: 5.1 mmol/L (ref 3.5–5.2)
Sodium: 140 mmol/L (ref 134–144)

## 2020-05-16 NOTE — Telephone Encounter (Signed)
Paged by answering service and spoke with wife.  Patient had a blood pressure of 120/64 while laying.  He sit up to eat breakfast and his blood pressure was 101/68.  Concern for orthostatic hypotension.  Patient is asymptomatic.  Advised only start metoprolol succinate at 12.5 mg if blood pressure sustains above 105 systolically.  Advised to go to ER if dizziness, lethargic, shortness of breath or syncope.

## 2020-05-16 NOTE — Progress Notes (Signed)
Spoke with wife, hemoglobin stable, renal function and electrolyte ok. Platelet level still high, but coming down.

## 2020-05-16 NOTE — Telephone Encounter (Signed)
Agree with Vin that he is likely orthostatic, his discharge weight was 207 lbs. Yesterday, his weight was down to 192 lbs. Spoke with his wife, his BP is stable now, HR borderline elevated in the 90s. Discussed his lab work from yesterday. Continue with current plan to followup next Thur.

## 2020-05-18 NOTE — Progress Notes (Signed)
Cardiology Office Note:    Date:  05/21/2020   ID:  Joshua Mack, DOB June 26, 1941, MRN [id number]  PCP:  [name], [name], MD  Cardiologist:  Peter SwazilandJordan, MD  Electrophysiologist:  None   Referring MD: [name], [name], MD   Chief Complaint: follow-up of hypotension  History of Present Illness:    Joshua Mack is a 79 y.o. male with a history of CAD with recent STEMI requiring IVBP support and ultimately treated with CABG x4 complicated by postoperative ileus, ischemic cardiomyopathy, sleep apnea, hypertension, hyperlipidemia, and spinal stenosis who is a patient of Dr. SwazilandJordan and presents today for follow-up of hypotension.  Patient admitted from 04/27/2020 to 05/14/2020 for acute STEMI. He underwent urgent cardiac catheterization which showed severe multivessel disease including 100% mid LAD occlusion after a large D1 with faint right to left collateral, 90% ramus intermediate, 100% CTO of mid left circumflex with good left to left collaterals, 100% CTO of proximal RCA with right to right and left to right collaterals. LV gram was unable to to assess LV function due to underfilling.   There was also moderately elevated LV filling pressure with preserved cardiac output of 4.4 L/min.  Patient was placed on IVBP support.  Echocardiogram obtained on the following day showed EF 30 to 35%, grade 2 DD.  Patient eventually underwent urgent CABG x4 with LIMA to LAD, SVG to PDA, SVG to D1 and SVG to OM 3 by Dr. Cliffton AstersLightfoot.  Intra-aortic balloon pump was removed during the procedure. He did have postoperative ileus however bowel function resumed over time. He did have appropriate postsurgical anemia. He did not have significant cardiac arrhythmia in the postoperative course. Since discharge, patient returned to the ED on 05/04/2020 after a brief unresponsive episode. He was given IV fluids. Work-up was unremarkable in the ED so he was felt to be stable for discharge. He returned to the hospital again on  05/09/2020 due to sudden expression of fluid from his sternal incision.  He was seen by Dr. Renaldo FiddlerAdkins who placed a chest tube and removed 1300 mL of serosanguineous fluid.  Chest tube was later removed on 05/12/2020. Repeat limited echocardiogram obtained on 05/09/2020 showed EF 30 to 35%, trivial MR, and mild AI.   Patient was seen in the office for follow-up on 05/15/2020 at which time he reported significant improvement in shortness of breath after recent chest tube placement and no chest pain. However, he was hypotensive in the office with manual BP of 88/56. While in the offie, he became diaphoretic and weak. He was treated with IV fluids in the office due to persistently low BP. Systolic BP improved to 106 after fluids. His Lasix, Potassium, Lisinopril, and Lopressor were all discontinued. He was instructed to start Toprol the following day if systolic BP >105 and was advised to follow-up in 1 week.   Patient here today for follow-up. Here with wife. He states he has been feeling great since last visit. BP 119/74 in the office today; however, still soft at times with systolic BP in the 90's. Therefore, he has not started the long-acting Metoprolol because his wife is concerned about this.  He does not seem symptomatic with this. Wife states he will occasionally get hot and break out into sweat for a little while but no other cardiac symptoms. He only has chest discomfort if he coughs or sneezes. No shortness of breath, orthopnea, PND, edema, palpitations, lightheadedness, or dizziness. Weight down 3 lbs since last visit.  No abnormal bleeding  on dual antiplatelet therapy.  Past Medical History:  Diagnosis Date   Complication of anesthesia    " I had a reaction to versed "   Coronary artery disease    High cholesterol    Hypertension    Sleep apnea     Past Surgical History:  Procedure Laterality Date   CARDIAC SURGERY     CHEST TUBE INSERTION Left 05/09/2020   CORONARY ARTERY BYPASS GRAFT  N/A 04/28/2020   Procedure: CORONARY ARTERY BYPASS GRAFTING (CABG) times four using LIMA to LAD; bilateral endoscopic greater saphenous vein harvest: SVG to Circ; SVG to RAMUS; and SVG to PDA.;  Surgeon: Corliss Skains, MD;  Location: [facility] OR;  Service: Open Heart Surgery;  Laterality: N/A;   ENDOVEIN HARVEST OF GREATER SAPHENOUS VEIN Bilateral 04/28/2020   Procedure: [name];  Surgeon: Corliss Skains, MD;  Location: [facility] OR;  Service: Open Heart Surgery;  Laterality: Bilateral;   IABP INSERTION N/A 04/27/2020   Procedure: IABP Insertion;  Surgeon: Swaziland, Peter M, MD;  Location: [facility] INVASIVE CV LAB;  Service: Cardiovascular;  Laterality: N/A;   LUMBAR LAMINECTOMY/DECOMPRESSION MICRODISCECTOMY N/A 05/03/2017   Procedure: Microlumbar decompression L4-5, L3-4,  L5-S1 WITH LATERAL AUTOGRAFT FUSION;  Surgeon: Jene Every, MD;  Location: [facility] ORS;  Service: Orthopedics;  Laterality: N/A;   RIGHT/LEFT HEART CATH AND CORONARY ANGIOGRAPHY N/A 04/27/2020   Procedure: RIGHT/LEFT HEART CATH AND CORONARY ANGIOGRAPHY;  Surgeon: Swaziland, Peter M, MD;  Location: [facility] INVASIVE CV LAB;  Service: Cardiovascular;  Laterality: N/A;    Current Medications: Current Meds  Medication Sig   acetaminophen (TYLENOL) 500 MG tablet Take 500-1,000 mg by mouth 2 (two) times daily as needed for mild pain or headache.    aspirin EC 81 MG EC tablet Take 1 tablet (81 mg total) by mouth daily.   atorvastatin (LIPITOR) 40 MG tablet Take 1 tablet (40 mg total) by mouth daily.   clopidogrel (PLAVIX) 75 MG tablet Take 1 tablet (75 mg total) by mouth daily.   Melatonin 5 MG TABS Take 5 mg by mouth at bedtime.   Multiple Vitamins-Minerals (CENTRUM SILVER 50+MEN) TABS Take 1 tablet by mouth daily with breakfast.     Allergies:   Clemastine, Fentanyl, Midazolam, Phenylpropanolamine, Amoxicillin, Other, and Tavist nd [loratadine]   Social History   Socioeconomic History   Marital status:  Married    Spouse name: Not on file   Number of children: Not on file   Years of education: Not on file   Highest education level: Not on file  Occupational History   Not on file  Tobacco Use   Smoking status: Never Smoker   Smokeless tobacco: Never Used  Substance and Sexual Activity   Alcohol use: No   Drug use: No   Sexual activity: Not on file  Other Topics Concern   Not on file  Social History Narrative   Not on file   Social Determinants of Health   Financial Resource Strain:    Difficulty of Paying Living Expenses:   Food Insecurity:    Worried About Programme researcher, broadcasting/film/video in the Last Year:    Barista in the Last Year:   Transportation Needs:    Freight forwarder (Medical):    Lack of Transportation (Non-Medical):   Physical Activity:    Days of Exercise per Week:    Minutes of Exercise per Session:   Stress:    Feeling of Stress :  Social Connections:    Frequency of Communication with Friends and Family:    Frequency of Social Gatherings with Friends and Family:    Attends Religious Services:    Active Member of Clubs or Organizations:    Attends Banker Meetings:    Marital Status:      Family History: The patient's family history is not on file.  ROS:   Please see the history of present illness.    All other systems reviewed and are negative.  EKGs/Labs/Other Studies Reviewed:    The following studies were reviewed today:  Cardiac Catheterization 04/27/2020:  Prox RCA lesion is 100% stenosed.  Prox Cx to Mid Cx lesion is 100% stenosed.  Ramus lesion is 90% stenosed.  Mid LAD lesion is 100% stenosed.  LV end diastolic pressure is moderately elevated.   1. 3 vessel occlusive CAD.    - 100% mid LAD after a large diagonal branch. Faint right to left collaterals.    - 90% ramus intermediate    - 100% CTO of the mid LCx with good left to left collaterals    - 100% CTO of the proximal RCA. Good  right to right and left to right collaterals. 2. Difficult to assess LV function due to underfilling. Will assess with Echo 3. Moderately elevated LV filling pressures. Improved with IV lasix and IABP 4. Normal right heart pressures 5. Preserved cardiac output 4.4 liters/min  Plan: transfer to ICU. Heparinize. IABP support. CT surgery consultation for CABG. Assess LV function with Echo. Continue diuresis. Trend troponin levels.  _______________  Echocardiogram 04/28/2020: Impressions: 1. No intracavitary thrombus is seen. The dyskinetic anteroapical area is  not thinned or hyperechoic, suggesting recent ischemia/infarction. Left  ventricular ejection fraction, by estimation, is 30 to 35%. The left  ventricle has moderate to severely  decreased function. The left ventricle demonstrates regional wall motion  abnormalities (see scoring diagram/findings for description). Left  ventricular diastolic parameters are consistent with Grade II diastolic  dysfunction (pseudonormalization).  Elevated left atrial pressure.  2. Right ventricular systolic function is normal. The right ventricular  size is normal. A catheter or pacemaker wire is seen in the right  ventricle. is visualized.  3. The mitral valve is normal in structure. No evidence of mitral valve  regurgitation.  4. The aortic valve is normal in structure. Aortic valve regurgitation is  not visualized.   Comparison(s): Changes from prior study are noted. The left ventricular function is significantly worse. The left ventricular wall motion abnormalities are new. _______________  Echocardiogram 05/09/2020: Impressions: 1. Left ventricular ejection fraction, by estimation, is 30 to 35%. The  left ventricle has moderately decreased function. The left ventricle  demonstrates regional wall motion abnormalities (see scoring  diagram/findings for description).  2. Right ventricular systolic function is normal. The right ventricular    size is normal.  3. The mitral valve is degenerative. Trivial mitral valve regurgitation.  4. The aortic valve is tricuspid. Aortic valve regurgitation is mild. No aortic stenosis is present.   LV Wall Scoring:  The mid and distal anterior septum, apical lateral segment, and apex are akinetic. The anterior wall and basal anteroseptal segment are hypokinetic. The mid inferoseptal segment is normal.  EKG:  EKG ordered today. EKG personally reviewed and demonstrates sinus tachycardia, rate 102 bpm, with inferior and anterior Q waves and T wave inversion in anterolateral leads (I, aVL, V2-V5).  T wave inversions in V4-V5 are new compared to last tracing on 05/15/2020 but otherwise no  new changes.  Recent Labs: 04/27/2020: ALT 30; B Natriuretic Peptide 103.3 04/29/2020: Magnesium 2.6 05/15/2020: BUN 21; Creatinine, Ser 1.32; Hemoglobin 10.2; Platelets 792; Potassium 5.1; Sodium 140  Recent Lipid Panel    Component Value Date/Time   CHOL 280 (H) 04/27/2020 2052   TRIG 149 04/27/2020 2052   HDL 57 04/27/2020 2052   CHOLHDL 4.9 04/27/2020 2052   VLDL 30 04/27/2020 2052   LDLCALC NOT CALCULATED 04/27/2020 2052    Physical Exam:    Vital Signs: BP 119/74    Pulse (!) 102    Ht 5\' 10"  (1.778 m)    Wt 189 lb (85.7 kg)    SpO2 99%    BMI 27.12 kg/m     Wt Readings from Last 3 Encounters:  05/21/20 189 lb (85.7 kg)  05/15/20 192 lb (87.1 kg)  05/13/20 192 lb 12.8 oz (87.5 kg)     General: 79 y.o. male in no acute distress. HEENT: Normocephalic and atraumatic. Sclera clear.  Neck: Supple. No carotid bruits. No JVD. Heart: Mild sinus tachycardia. Distinct S1 and S2. No murmurs, gallops, or rubs. Sternotomy and chest tube under left breast incisions healing well. Radial pulses 2+ and equal bilaterally. Lungs: No increased work of breathing. Clear to ausculation bilaterally. No wheezes, rhonchi, or rales.  Abdomen: Soft, non-distended, and non-tender to palpation. Bowel sounds  present. Extremities: No lower extremity edema.    Skin: Warm and dry. Neuro: Alert and oriented x3. No focal deficits. Psych: Normal affect. Responds appropriately.   Assessment:    1. Hypotension, unspecified hypotension type   2. Coronary artery disease involving native coronary artery of native heart without angina pectoris   3. S/P CABG x 4   4. Chronic combined systolic and diastolic CHF (congestive heart failure) ([place])   5. Pleural effusion   6. Hyperlipidemia, unspecified hyperlipidemia type     Plan:    Hypotension - BP better today at 119/74. However, BP still soft at times at home with systolic BP in the 42'V. Patient mostly asymptomatic with this. Heart rates elevated in the high 90's to low 100's.  - Continue to hold Lisinopril and Lasix.  - Patient never started Toprol-XL last week. I think he will be able to tolerate low dose metoprolol. Wife had concerns about patient being on long-acting metoprolol given BP is still soft at time. Therefore, will try Lopressor 12.5mg  twice daily rather than Toprol-XL 25mg  daily. - Patient to notify us if systolic BP <95 or if he is symptomatic.  - Encouraged him  to make sure he is staying hydrated.  - Asked patient/wife to bring home BP machine to next visit so that we can ensure that it is accurate.   CAD with Recent STEMI s/p CABG - S/p CABG x4 on 04/28/2020. - No angina. - Continue aspirin, beta-blocker, and statin.  Chronic Combined CHF/ Ischemic Cardiomyopathy - Most recent Echo on 5/22/021 showed LVEF of 30-35% with multiple wall motion abnormalities as stated above.  - Appears euvolemic on exam. - No Lasix needed at this time. - Previously on Lisinopril but this was discontinued at last visit due to hypotension. BP still soft so will not add back. If BP allows, would likely try to add Losartan in the future. - Wife nervous about patient being on long-acting metoprolol given BP still soft at times. Therefore, will try  Lopressor 12.5mg  twice daily.  - Recommended daily weights and sodium/fluid restriction. Patient to notify us if he has 3lb weight gain in  1 day or 5lb weight gain in 1 week.   Pleural Effusion - S/p chest tube placement on 05/09/2020 with remove of 1,345mL of serosanguineous fluid. Chest tube removed on 05/12/2020. - Shortness of breath resolved.  - Has follow-up with CT surgery tomorrow.  Hyperlipidemia - Lipid panel from 04/27/2020: Total Cholesterol 280, Triglycerides 149, HDL 57. LDL could not be calculated. - Continue Lipitor 40mg  daily.   Disposition: Follow up in 2 weeks with APP and then Dr. in 3 weeks.    Medication Adjustments/Labs and Tests Ordered: Current medicines are reviewed at length with the patient today.  Concerns regarding medicines are outlined above.  Orders Placed This Encounter  Procedures   EKG 12-Lead   Meds ordered this encounter  Medications   metoprolol tartrate (LOPRESSOR) 25 MG tablet    Sig: Take 0.5 tablets (12.5 mg total) by mouth 2 (two) times daily.    Dispense:  90 tablet    Refill:  1    Patient Instructions  Medication Instructions:   STOP Metoprolol Succinate  START Metoprolol Tartrate 25 mg---take 1/2 tab (12.5 mg) twice daily.  *If you need a refill on your cardiac medications before your next appointment, please call your pharmacy*   Follow-Up: At [facility], you and your health needs are our priority.  As part of our continuing mission to provide you with exceptional heart care, we have created designated Provider Care Teams.  These Care Teams include your primary Cardiologist (physician) and Advanced Practice Providers (APPs -  Physician Assistants and Nurse Practitioners) who all work together to provide you with the care you need, when you need it.  We recommend signing up for the patient portal called "MyChart".  Sign up information is provided on this After Visit Summary.  MyChart is used to connect with patients  for Virtual Visits (Telemedicine).  Patients are able to view lab/test results, encounter notes, upcoming appointments, etc.  Non-urgent messages can be sent to your provider as well.   To learn more about what you can do with MyChart, go to [facility].    Your next appointment:   3 month(s)  The format for your next appointment:   In Person  Provider:   Peter [email or web address], MD   Other Instructions Please call our office if your blood pressure is dropping lower than 95 (top number).     Signed, [place], PA-C  05/21/2020 1:38 PM    [facility]

## 2020-05-21 ENCOUNTER — Ambulatory Visit (INDEPENDENT_AMBULATORY_CARE_PROVIDER_SITE_OTHER): Payer: Commercial Managed Care - PPO | Admitting: Student

## 2020-05-21 ENCOUNTER — Telehealth: Payer: Self-pay | Admitting: Cardiology

## 2020-05-21 ENCOUNTER — Other Ambulatory Visit: Payer: Self-pay

## 2020-05-21 ENCOUNTER — Other Ambulatory Visit: Payer: Self-pay | Admitting: Thoracic Surgery (Cardiothoracic Vascular Surgery)

## 2020-05-21 ENCOUNTER — Encounter: Payer: Self-pay | Admitting: Student

## 2020-05-21 VITALS — BP 119/74 | HR 102 | Ht 70.0 in | Wt 189.0 lb

## 2020-05-21 DIAGNOSIS — I5042 Chronic combined systolic (congestive) and diastolic (congestive) heart failure: Secondary | ICD-10-CM

## 2020-05-21 DIAGNOSIS — J90 Pleural effusion, not elsewhere classified: Secondary | ICD-10-CM

## 2020-05-21 DIAGNOSIS — I959 Hypotension, unspecified: Secondary | ICD-10-CM

## 2020-05-21 DIAGNOSIS — Z951 Presence of aortocoronary bypass graft: Secondary | ICD-10-CM

## 2020-05-21 DIAGNOSIS — E785 Hyperlipidemia, unspecified: Secondary | ICD-10-CM

## 2020-05-21 DIAGNOSIS — I2510 Atherosclerotic heart disease of native coronary artery without angina pectoris: Secondary | ICD-10-CM

## 2020-05-21 MED ORDER — METOPROLOL TARTRATE 25 MG PO TABS
12.5000 mg | ORAL_TABLET | Freq: Two times a day (BID) | ORAL | 1 refills | Status: DC
Start: 2020-05-21 — End: 2020-06-09

## 2020-05-21 NOTE — Telephone Encounter (Signed)
Spoke with patient's spouse. Patient unstable currently per spouse. She is afraid he will fall. Okayed spouse to accompany patient to appointment.

## 2020-05-21 NOTE — Patient Instructions (Signed)
Medication Instructions:   STOP Metoprolol Succinate  START Metoprolol Tartrate 25 mg---take 1/2 tab (12.5 mg) twice daily.  *If you need a refill on your cardiac medications before your next appointment, please call your pharmacy*   Follow-Up: At [facility], you and your health needs are our priority.  As part of our continuing mission to provide you with exceptional heart care, we have created designated Provider Care Teams.  These Care Teams include your primary Cardiologist (physician) and Advanced Practice Providers (APPs -  Physician Assistants and Nurse Practitioners) who all work together to provide you with the care you need, when you need it.  We recommend signing up for the patient portal called "MyChart".  Sign up information is provided on this After Visit Summary.  MyChart is used to connect with patients for Virtual Visits (Telemedicine).  Patients are able to view lab/test results, encounter notes, upcoming appointments, etc.  Non-urgent messages can be sent to your provider as well.   To learn more about what you can do with MyChart, go to [email or web address].    Your next appointment:   3 month(s)  The format for your next appointment:   In Person  Provider:   Peter [place]azil[place], MD   Other Instructions Please call our office if your blood pressure is dropping lower than 95 (top number).

## 2020-05-21 NOTE — Telephone Encounter (Signed)
Patient's wife, Rene Kocher, is requesting to accompany the patient during appointment scheduled for today, 05/21/20 at 10:30 AM with [name]. Please call.

## 2020-05-22 ENCOUNTER — Encounter: Payer: Self-pay | Admitting: Thoracic Surgery (Cardiothoracic Vascular Surgery)

## 2020-05-22 ENCOUNTER — Ambulatory Visit (INDEPENDENT_AMBULATORY_CARE_PROVIDER_SITE_OTHER): Payer: Self-pay | Admitting: Thoracic Surgery (Cardiothoracic Vascular Surgery)

## 2020-05-22 ENCOUNTER — Ambulatory Visit
Admission: RE | Admit: 2020-05-22 | Discharge: 2020-05-22 | Disposition: A | Discharge status: Home / Self Care | Payer: Commercial Managed Care - PPO | Source: Ambulatory Visit | Attending: Thoracic Surgery (Cardiothoracic Vascular Surgery) | Admitting: Thoracic Surgery (Cardiothoracic Vascular Surgery)

## 2020-05-22 VITALS — BP 118/71 | HR 80 | Temp 97.9°F | Resp 20 | Ht 70.0 in | Wt 190.0 lb

## 2020-05-22 DIAGNOSIS — I2510 Atherosclerotic heart disease of native coronary artery without angina pectoris: Secondary | ICD-10-CM

## 2020-05-22 DIAGNOSIS — Z951 Presence of aortocoronary bypass graft: Secondary | ICD-10-CM

## 2020-05-22 NOTE — Progress Notes (Signed)
      [address]       [place] [address]             [phone]        ALPHONZA TRAMELL [facility] Medical Record #[id number] Date of Birth: 1941/09/08  Referring: Nicoletta Ba, MD Primary Care: Johny Blamer, MD Primary Cardiologist:Peter Swazil[place], MD  Reason for visit:   follow-up  History of Present Illness:     This is a 79 year old gentleman that presents for his second follow-up appointment following emergent CABG.  He was readmitted pleural effusion but after discharge done well.  He states that he feels really good today.  He continues to have some orthostasis.  Physical Exam: BP 118/71   Pulse 80   Temp 97.9 F (36.6 C) (Temporal)   Resp 20   Ht 5\' 10"  (1.778 m)   Wt 190 lb (86.2 kg)   SpO2 96% Comment: RA  BMI 27.26 kg/m   Alert NAD Incision clean.  Sternum stable Abdomen soft, ND No peripheral edema   Diagnostic Studies & Laboratory data: CXR: Clear     Assessment / Plan:   79 year old male status post CABG.  Doing much better. Cleared for cardiac rehab. Follow-up as needed   70 05/22/2020 2:36 PM

## 2020-05-23 ENCOUNTER — Telehealth: Payer: Self-pay | Admitting: Internal Medicine

## 2020-05-23 NOTE — Telephone Encounter (Signed)
I received a call from Joshua Mack's wife, Joshua Mack, because the patient was having lower blood pressures after taking metoprolol tartrate 12.5 mg yesterday. She notes that in the evening after his PM dose, his blood pressures were in the 80s/50s and he appeared pale (although he stated he did not feel symptomatic). She also noted she felt he was orthostatic.   I advised the Allens that for now, I would hold the metoprolol given that this AM his blood pressure was now 118/60 and he looked better. I let them know that I would route a message to both his cardiology team and his PCP who would reach out with new plan. Otherwise, continue to hold metoprolol until you hear from them.   I let them know if he were to have lower Bps persistently and were to become symptomatic then to please come to the emergency dept for evaluation.   Gerre Scull, MD Cardiology moonlighter

## 2020-05-25 NOTE — Telephone Encounter (Signed)
I saw the below note from the overnight fellow over the weekend.OK to hold metoprolol for now. Let's have patient monitor his BP and heart twice a day (morning and evening) for the next 5 days. He should keep a log of this and then notify us of the readings.     Thank you!

## 2020-05-26 ENCOUNTER — Other Ambulatory Visit: Payer: Self-pay | Admitting: Student

## 2020-05-26 NOTE — Progress Notes (Unsigned)
error 

## 2020-05-26 NOTE — Telephone Encounter (Signed)
Hi Kristin,   Random question for you. This patient has had problems with low BP (with systolic as low as the 80's) even when only on Lopressor 12.5mg  twice daily. Patient said he was told that there is a 6.25mg  dose. Is that true? I don't think I have ever seen that used.   Thank you! Ziyad Dyar

## 2020-05-27 NOTE — Telephone Encounter (Signed)
Thank you so much! I had recommended metoprolol succinate at the last visit but wife was worried about trying that because it was long acting. We are seeing him back in the office in 2 weeks so I may just have him hold it until we can follow back up. Thanks again for your help.  Carmie Kanner

## 2020-05-27 NOTE — Telephone Encounter (Signed)
Joshua Mack  There is not a smaller tablet than the 25mg .  The safest way to get 6.25 mg dose is to take 1/2 of the metoprolol succinate 25 mg tablet - it's equivalent to 6.25 mg bid of the tartrate.  We don't usually think of this low a dose of metoprolol affecting blood pressure enough to make someone hypotensive.  You might want to consider stopping it completely for a few weeks and see if the hypotension continues.  It may be something that just needs to be treated with midodrine or florinef.    

## 2020-05-27 NOTE — Telephone Encounter (Signed)
Called, LVM advising of message below- advised to hold Metoprolol and call back with BP/HR readings for 5 days.

## 2020-05-29 ENCOUNTER — Telehealth (HOSPITAL_COMMUNITY): Payer: Self-pay

## 2020-05-29 ENCOUNTER — Telehealth: Payer: Self-pay | Admitting: Physician Assistant

## 2020-05-29 NOTE — Telephone Encounter (Signed)
Spoke with wife who report they have continued to hold metoprolol as instructed by Callie and BP this morning was 92/56 HR 82 while laying down. She then gave pt some fluids and had him sit up. It increased to 123/85 HR 92. BP now 103/66 HR 77. Wife state she just wanted to make [facility] aware prior to appointment on 6/17. Nurse instructed wife to continue hydration and monitor BP. Wife verbalized understanding.

## 2020-05-29 NOTE — Telephone Encounter (Signed)
Pt c/o medication issue:  1. Name of Medication: metoprolol tartrate (LOPRESSOR) 25 MG tablet  2. How are you currently taking this medication (dosage and times per day)? Pt is not taking this medicine  3. Are you having a reaction (difficulty breathing--STAT)? no  4. What is your medication issue? Pt has had a low BP and slower pulse when taking this medicine. The wife states she called the on call provider and was told to have the patient stop the medication. The wife said that they will continue to monitor his BP and HR over the weekend but she wanted to make [facility] aware before his appointment Thursday

## 2020-05-29 NOTE — Telephone Encounter (Signed)
Patient will come in for orientation on 06/16/20 @ 2pm and will attend the 3pm exercise class.

## 2020-05-29 NOTE — Telephone Encounter (Signed)
Called patient- able to reach him on the phone, he did advise he would not take it until he seen PA on 6/17- and bring BP log with him to this visit.   Patient thankful for call back.

## 2020-06-03 ENCOUNTER — Telehealth: Payer: Self-pay | Admitting: Physician Assistant

## 2020-06-03 NOTE — Telephone Encounter (Signed)
Called patient wife- gave okay to come to appointment.   May need orthostatic BP completed at appointment.

## 2020-06-03 NOTE — Telephone Encounter (Signed)
New Message:  Wife called and said she will need to come in with pt tomorrow. She says he have been blood pressure issues and she need to be with him.

## 2020-06-04 ENCOUNTER — Other Ambulatory Visit: Payer: Self-pay | Admitting: Physician Assistant

## 2020-06-04 ENCOUNTER — Ambulatory Visit (INDEPENDENT_AMBULATORY_CARE_PROVIDER_SITE_OTHER): Payer: Commercial Managed Care - PPO | Admitting: Physician Assistant

## 2020-06-04 ENCOUNTER — Encounter: Payer: Self-pay | Admitting: Physician Assistant

## 2020-06-04 ENCOUNTER — Other Ambulatory Visit: Payer: Self-pay

## 2020-06-04 VITALS — BP 90/62 | HR 95 | Temp 97.3°F | Ht 70.0 in | Wt 191.4 lb

## 2020-06-04 DIAGNOSIS — I25810 Atherosclerosis of coronary artery bypass graft(s) without angina pectoris: Secondary | ICD-10-CM

## 2020-06-04 DIAGNOSIS — I959 Hypotension, unspecified: Secondary | ICD-10-CM

## 2020-06-04 DIAGNOSIS — J90 Pleural effusion, not elsewhere classified: Secondary | ICD-10-CM

## 2020-06-04 DIAGNOSIS — I255 Ischemic cardiomyopathy: Secondary | ICD-10-CM

## 2020-06-04 DIAGNOSIS — E785 Hyperlipidemia, unspecified: Secondary | ICD-10-CM | POA: Diagnosis not present

## 2020-06-04 NOTE — Progress Notes (Signed)
Cardiology Office Note:    Date:  06/06/2020   ID:  Joshua Mack, DOB [birth year], MRN [id number]  PCP:  Johny Blamer, MD  [facility] Cardiologist:  Peter Swaziland, MD  [facility] Electrophysiologist:  None   Referring MD: Johny Blamer, MD   No chief complaint on file.   History of Present Illness:    Joshua Mack is a 79 y.o. male with a hx of HTN, HLD, spinal stenosis, prior DVT and CAD.  Patient presented to the hospital on 04/27/2020 for chest pain and shortness of breath and found to have STEMI. Urgent cardiac catheterization performed on 04/24/2020 showed 100% mid LAD occlusion after a large D1 with faint right to left collateral, 90% ramus intermediate, 100% CTO of mid left circumflex with good left to left collaterals, 100% CTO of proximal RCA with right to right and left to right collaterals, LV gram was unable to assess LV function due to underfilling.  There was also moderately elevated LV filling pressure with preserved cardiac output of 4.4 L/min.  Patient was placed on IVBP support.  Echocardiogram obtained on the following day showed EF 30 to 35%, grade 2 DD.  Patient eventually underwent urgent CABG x4 with LIMA to LAD, SVG to PDA, SVG to D1 and SVG to OM 3 by Dr. Cliffton Asters.  Intra-aortic balloon pump was removed during the procedure.  He did have postoperative ileus however bowel function resumed over time.  He did not have significant cardiac arrhythmia in the postoperative course.  He did have appropriate postsurgical anemia.  Since discharge, patient returned to the ED on 5/17 after a brief unresponsive episode.  He returned to the hospital again on 5/22 due to sudden expression of fluid from his sternal incision.  He was seen by Dr. Renaldo Fiddler who placed the chest tube and they removed 1300 mL of serosanguineous fluid.  Chest tube was later removed on 5/25.  Repeat limited echocardiogram obtained on 05/09/2020 showed EF 30 to 35%, trivial mitral regurgitation, mild  AI.  I initially saw the patient for follow-up on 05/07/2020, he was hypotensive at the time was blood pressure in the 80s.  We kept the patient for several hours in the cardiology office for IV fluid hydration.  I decided to hold his lisinopril, Lasix and the potassium supplement.  I also decided to discontinue his metoprolol tartrate and switch him to metoprolol succinate at a later time.  He was never started on metoprolol succinate due to low blood pressure.  He was seen by [name], PA-C on 06/17/2020, given his concern for low blood pressure, he was restarted on Lopressor 12.5 mg twice daily rather than Toprol-XL.  Patient presents today for cardiology office visit along with his wife.  He is interested to find out the earliest time he can go back to work, he is close to 1 month out from the surgery, however his wife is quite concerned of his previous work which involved heavy activity.  She does not feel comfortable to have the patient go back to work until he is seen by Dr. Swaziland, I will arrange a earlier visit with Dr. Swaziland.  Also, his blood pressure remain low today.  Blood pressure in the right arm was 100/86, left arm was 96/82.  I decided not to have him start on the metoprolol given the low blood pressure.  I will double check with Dr. Swaziland to see if he would recommend starting on low-dose digoxin.  Otherwise, he can have  a repeat echocardiogram sometimes in August to reevaluate LV dysfunction.  Overall, patient feels very well without any significant lower extremity edema, orthopnea or PND.  He denies any recent exertional chest discomfort.   Past Medical History:  Diagnosis Date  . Complication of anesthesia    " I had a reaction to versed "  . Coronary artery disease   . High cholesterol   . Hypertension   . Sleep apnea     Past Surgical History:  Procedure Laterality Date  . CARDIAC SURGERY    . CHEST TUBE INSERTION Left 05/09/2020  . CORONARY ARTERY BYPASS GRAFT N/A  04/28/2020   Procedure: CORONARY ARTERY BYPASS GRAFTING (CABG) times four using LIMA to LAD; bilateral endoscopic greater saphenous vein harvest: SVG to Circ; SVG to RAMUS; and SVG to PDA.;  Surgeon: Corliss SkainsLightfoot, Harrell O, MD;  [facility];  Service: Open Heart Surgery;  Laterality: N/A;  . ENDOVEIN HARVEST OF GREATER SAPHENOUS VEIN Bilateral 04/28/2020   Procedure: Mack GuiseEndovein Harvest Of Greater Saphenous Vein;  Surgeon: Corliss SkainsLightfoot, Harrell O, MD;  [facility];  Service: Open Heart Surgery;  Laterality: Bilateral;  . IABP INSERTION N/A 04/27/2020   Procedure: IABP Insertion;  Surgeon: SwazilandJordan, Peter M, MD;  [facility];  Service: Cardiovascular;  Laterality: N/A;  . LUMBAR LAMINECTOMY/DECOMPRESSION MICRODISCECTOMY N/A 05/03/2017   Procedure: Microlumbar decompression L4-5, L3-4,  L5-S1 WITH LATERAL AUTOGRAFT FUSION;  Surgeon: Jene EveryBeane, Jeffrey, MD;  Location: [facility] ORS;  Service: Orthopedics;  Laterality: N/A;  . RIGHT/LEFT HEART CATH AND CORONARY ANGIOGRAPHY N/A 04/27/2020   Procedure: RIGHT/LEFT HEART CATH AND CORONARY ANGIOGRAPHY;  Surgeon: SwazilandJordan, Peter M, MD;  [facility];  Service: Cardiovascular;  Laterality: N/A;    Current Medications: Current Meds  Medication Sig  . acetaminophen (TYLENOL) 500 MG tablet Take 500-1,000 mg by mouth 2 (two) times daily as needed for mild pain or headache.   Marland Kitchen. aspirin EC 81 MG EC tablet Take 1 tablet (81 mg total) by mouth daily.  Marland Kitchen. atorvastatin (LIPITOR) 40 MG tablet Take 1 tablet (40 mg total) by mouth daily.  . clopidogrel (PLAVIX) 75 MG tablet Take 1 tablet (75 mg total) by mouth daily.  . Melatonin 5 MG TABS Take 5 mg by mouth at bedtime.  . metoprolol tartrate (LOPRESSOR) 25 MG tablet Take 0.5 tablets (12.5 mg total) by mouth 2 (two) times daily.  . Misc Natural Products (URINOZINC PROSTATE) CAPS Take 1 capsule by mouth daily.  . Multiple Vitamins-Minerals (CENTRUM SILVER 50+MEN) TABS Take 1 tablet by mouth daily with  breakfast.  . PRESCRIPTION MEDICATION CPAP- At bedtime and during times of rest  . Probiotic Product (PROBIOTIC PO) Take 1 capsule by mouth daily as needed (Constipation).  . shark liver oil-cocoa butter (PREPARATION H) 0.25-88.44 % suppository Place 1 suppository rectally as needed for hemorrhoids.  . [DISCONTINUED] Krill Oil 1000 MG CAPS Take 1,000 mg by mouth daily.  . [DISCONTINUED] Turmeric Curcumin 500 MG CAPS Take 500 mg by mouth daily.      Allergies:   Clemastine, Fentanyl, Midazolam, Phenylpropanolamine, Amoxicillin, Other, and Tavist nd [loratadine]   Social History   Socioeconomic History  . Marital status: Married    Spouse name: Not on file  . Number of children: Not on file  . Years of education: Not on file  . Highest education level: Not on file  Occupational History  . Not on file  Tobacco Use  . Smoking status: Never Smoker  . Smokeless tobacco:  Never Used  Vaping Use  . Vaping Use: Never used  Substance and Sexual Activity  . Alcohol use: No  . Drug use: No  . Sexual activity: Not on file  Other Topics Concern  . Not on file  Social History Narrative  . Not on file   Social Determinants of Health   Financial Resource Strain:   . Difficulty of Paying Living Expenses:   Food Insecurity:   . Worried About Programme researcher, broadcasting/film/video in the Last Year:   . Barista in the Last Year:   Transportation Needs:   . Freight forwarder (Medical):   Marland Kitchen Lack of Transportation (Non-Medical):   Physical Activity:   . Days of Exercise per Week:   . Minutes of Exercise per Session:   Stress:   . Feeling of Stress :   Social Connections:   . Frequency of Communication with Friends and Family:   . Frequency of Social Gatherings with Friends and Family:   . Attends Religious Services:   . Active Member of Clubs or Organizations:   . Attends Banker Meetings:   Marland Kitchen Marital Status:      Family History: The patient's family history is not on  file.  ROS:   Please see the history of present illness.     All other systems reviewed and are negative.  EKGs/Labs/Other Studies Reviewed:    The following studies were reviewed today: Cath 04/27/2020  Prox RCA lesion is 100% stenosed.  Prox Cx to Mid Cx lesion is 100% stenosed.  Ramus lesion is 90% stenosed.  Mid LAD lesion is 100% stenosed.  LV end diastolic pressure is moderately elevated.  1. 3 vessel occlusive CAD. - 100% mid LAD after a large diagonal branch. Faint right to left collaterals. - 90% ramus intermediate - 100% CTO of the mid LCx with good left to left collaterals - 100% CTO of the proximal RCA. Good right to right and left to right collaterals. 2. Difficult to assess LV function due to underfilling. Will assess with Echo 3. Moderately elevated LV filling pressures. Improved with IV lasix and IABP 4. Normal right heart pressures 5. Preserved cardiac output 4.4 liters/min  Plan: transfer to ICU. Heparinize. IABP support. CT surgery consultation for CABG. Assess LV function with Echo. Continue diuresis. Trend troponin levels.    Echo 04/28/2020 IMPRESSIONS    1. No intracavitary thrombus is seen. The dyskinetic anteroapical area is  not thinned or hyperechoic, suggesting recent ischemia/infarction. Left  ventricular ejection fraction, by estimation, is 30 to 35%. The left  ventricle has moderate to severely  decreased function. The left ventricle demonstrates regional wall motion  abnormalities (see scoring diagram/findings for description). Left  ventricular diastolic parameters are consistent with Grade II diastolic  dysfunction (pseudonormalization).  Elevated left atrial pressure.  2. Right ventricular systolic function is normal. The right ventricular  size is normal. A catheter or pacemaker wire is seen in the right  ventricle. is visualized.  3. The mitral valve is normal in structure. No evidence of mitral valve   regurgitation.  4. The aortic valve is normal in structure. Aortic valve regurgitation is  not visualized.    EKG:  EKG is not ordered today.   Recent Labs: 04/27/2020: ALT 30; B Natriuretic Peptide 103.3 04/29/2020: Magnesium 2.6 05/15/2020: BUN 21; Creatinine, Ser 1.32; Hemoglobin 10.2; Platelets 792; Potassium 5.1; Sodium 140  Recent Lipid Panel    Component Value Date/Time   CHOL 280 (H)  04/27/2020 2052   TRIG 149 04/27/2020 2052   HDL 57 04/27/2020 2052   CHOLHDL 4.9 04/27/2020 2052   VLDL 30 04/27/2020 2052   LDLCALC NOT CALCULATED 04/27/2020 2052    Physical Exam:    VS:  BP 90/62   Pulse 95   Temp (!) 97.3 F (36.3 C)   Ht 5\' 10"  (1.778 m)   Wt 191 lb 6.4 oz (86.8 kg)   SpO2 96%   BMI 27.46 kg/m     Wt Readings from Last 3 Encounters:  06/04/20 191 lb 6.4 oz (86.8 kg)  05/22/20 190 lb (86.2 kg)  05/21/20 189 lb (85.7 kg)     GEN:  Well nourished, well developed in no acute distress HEENT: Normal NECK: No JVD; No carotid bruits LYMPHATICS: No lymphadenopathy CARDIAC: RRR, no murmurs, rubs, gallops RESPIRATORY:  Clear to auscultation without rales, wheezing or rhonchi  ABDOMEN: Soft, non-tender, non-distended MUSCULOSKELETAL:  No edema; No deformity  SKIN: Warm and dry NEUROLOGIC:  Alert and oriented x 3 PSYCHIATRIC:  Normal affect   ASSESSMENT:    1. Coronary artery disease involving coronary bypass graft of native heart without angina pectoris   2. Hypotension, unspecified hypotension type   3. Hyperlipidemia LDL goal <70   4. Pleural effusion   5. Ischemic cardiomyopathy    PLAN:    In order of problems listed above:  1. CAD s/p CABG: Continue aspirin and Plavix.  Denies any recent chest pain or shortness of breath.  He is recovering quite well   2. Hypotension: Although he carries a previous diagnosis of hypertension, his blood pressure has been quite low recently.  He has not been able to start on the metoprolol tartrate even though it is  listed on his medication list.  In fact, during the previous visit with me, we actually have to give him IV fluid during the clinic in order to raise his blood pressure.  I ended up removing majority of his heart failure medication at the time.  3. Ischemic cardiomyopathy: Unfortunately he is not on any heart failure medication at this time.  I will check with Dr. 07/21/20 to see if he would like to have him start on a low-dose digoxin as his blood pressure will not tolerate any other heart failure medications.  Will defer to Dr. Swaziland to consider the timing of repeat echocardiogram.  If ejection fraction remains lower than 35%, may need to refer him for ICD.  4. Hyperlipidemia: Continue Lipitor  5. Pleural effusion: His pleural effusion has completely resolved based on physical exam.   Medication Adjustments/Labs and Tests Ordered: Current medicines are reviewed at length with the patient today.  Concerns regarding medicines are outlined above.  No orders of the defined types were placed in this encounter.  No orders of the defined types were placed in this encounter.   Patient Instructions  Medication Instructions:  HOLD METOPROLOL FOR NOW-Faraz Ponciano WILL DISCUSS THIS WITH DR Swaziland AND LET YOU KNOW. *If you need a refill on your cardiac medications before your next appointment, please call your pharmacy*  Follow-Up: Your next appointment:  FOLLOW UP END OF JULY   In Person with Peter Swaziland, MD  At [facility], you and your health needs are our priority.  As part of our continuing mission to provide you with exceptional heart care, we have created designated Provider Care Teams.  These Care Teams include your primary Cardiologist (physician) and Advanced Practice Providers (APPs -  Physician Assistants and Nurse  Practitioners) who all work together to provide you with the care you need, when you need it.     Hilbert Corrigan, [place]  06/06/2020 11:45 PM    [facility]  HeartCare

## 2020-06-04 NOTE — Patient Instructions (Signed)
Medication Instructions:  HOLD METOPROLOL FOR NOW-HAO WILL DISCUSS THIS WITH DR Swaziland AND LET YOU KNOW. *If you need a refill on your cardiac medications before your next appointment, please call your pharmacy*  Follow-Up: Your next appointment:  FOLLOW UP END OF JULY   In Person with Peter Swaziland, MD  At [facility], you and your health needs are our priority.  As part of our continuing mission to provide you with exceptional heart care, we have created designated Provider Care Teams.  These Care Teams include your primary Cardiologist (physician) and Advanced Practice Providers (APPs -  Physician Assistants and Nurse Practitioners) who all work together to provide you with the care you need, when you need it.

## 2020-06-06 ENCOUNTER — Telehealth (HOSPITAL_COMMUNITY): Payer: Self-pay

## 2020-06-09 NOTE — Telephone Encounter (Signed)
Cardiac Rehab Medication Review by a Pharmacist  Does the patient  feel that his/her medications are working for him/her?  yes  Has the patient been experiencing any side effects to the medications prescribed?  no Pt was recently taken off of the metoprolol due to low blood pressure.   Does the patient measure his/her own blood pressure or blood glucose at home?  yes Pt check blood pressure regularly, SBP usually in low 90s to 120s.   Does the patient have any problems obtaining medications due to transportation or finances?   no  Understanding of regimen: good Understanding of indications: good Potential of compliance: excellent    Pharmacist comments: Pt's spouse assisted with providing medication history. Pt mentioned an upcoming cardiology appointment where they may add new medication to lower heart rate without dropping blood pressure. Pt is looking forward to getting some exercise at cardiac rehab.   Lulu Riding, PharmD PGY1 Pharmacy Resident  Please check AMION for all [facility] phone numbers After 10:00 PM, call Main Pharmacy [phone]  06/09/2020 2:36 PM

## 2020-06-11 ENCOUNTER — Ambulatory Visit: Payer: Commercial Managed Care - PPO | Admitting: Thoracic Surgery (Cardiothoracic Vascular Surgery)

## 2020-06-12 ENCOUNTER — Ambulatory Visit: Payer: Commercial Managed Care - PPO | Admitting: Thoracic Surgery (Cardiothoracic Vascular Surgery)

## 2020-06-15 ENCOUNTER — Encounter (HOSPITAL_COMMUNITY)
Admission: RE | Admit: 2020-06-15 | Discharge: 2020-06-15 | Disposition: A | Discharge status: Home / Self Care | Payer: Commercial Managed Care - PPO | Source: Ambulatory Visit | Attending: Cardiology | Admitting: Cardiology

## 2020-06-15 ENCOUNTER — Other Ambulatory Visit: Payer: Self-pay

## 2020-06-15 DIAGNOSIS — Z951 Presence of aortocoronary bypass graft: Secondary | ICD-10-CM | POA: Insufficient documentation

## 2020-06-15 DIAGNOSIS — I213 ST elevation (STEMI) myocardial infarction of unspecified site: Secondary | ICD-10-CM | POA: Insufficient documentation

## 2020-06-15 NOTE — Progress Notes (Signed)
Cardiac Rehab Telephone Note:  Successful telephone encounter to Joshua Mack to confirm Cardiac Rehab orientation appointment for 06/16/20 at 2:00 pm. Nursing assessment completed. Patient questions answered. Instructions for appointment provided. Patient screening for Covid-19 negative.  Amyriah Buras E. Suzie Portela RN, BSN [place]  Cardiac and Pulmonary Rehabilitation Phone: [phone] Fax: [phone]

## 2020-06-16 ENCOUNTER — Encounter (HOSPITAL_COMMUNITY): Payer: Self-pay

## 2020-06-16 ENCOUNTER — Telehealth: Payer: Self-pay | Admitting: Cardiology

## 2020-06-16 ENCOUNTER — Encounter (HOSPITAL_COMMUNITY)
Admission: RE | Admit: 2020-06-16 | Discharge: 2020-06-16 | Disposition: A | Discharge status: Home / Self Care | Payer: Commercial Managed Care - PPO | Source: Ambulatory Visit | Attending: Cardiology | Admitting: Cardiology

## 2020-06-16 VITALS — BP 110/70 | HR 97 | Ht 69.25 in | Wt 188.5 lb

## 2020-06-16 DIAGNOSIS — I213 ST elevation (STEMI) myocardial infarction of unspecified site: Secondary | ICD-10-CM

## 2020-06-16 DIAGNOSIS — Z951 Presence of aortocoronary bypass graft: Secondary | ICD-10-CM

## 2020-06-16 HISTORY — DX: Heart failure, unspecified: I50.9

## 2020-06-16 NOTE — Progress Notes (Signed)
Patient here for cardiac rehab orientation. Intermittent to frequent PVC's noted. Patient asymptomatic. Vital signs stable.Will fax  Today's walk test sheet and ECG tracings to Dr. Elvis Coil office for review.

## 2020-06-16 NOTE — Progress Notes (Signed)
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  acetaminophen (TYLENOL) 500 MG tablet, Take 500 mg by mouth daily as needed for mild pain or headache. , Disp: , Rfl:  .  aspirin EC 81 MG EC tablet, Take 1 tablet (81 mg total) by mouth daily., Disp:  , Rfl:  .  atorvastatin (LIPITOR) 40 MG tablet, Take 1 tablet (40 mg total) by mouth daily., Disp: 30 tablet, Rfl: 3 .  clopidogrel (PLAVIX) 75 MG tablet, Take 1 tablet (75 mg total) by mouth daily., Disp: 30 tablet, Rfl: 3 .  Melatonin 5 MG TABS, Take 5 mg by mouth at bedtime., Disp: , Rfl:  .  Multiple Vitamins-Minerals (CENTRUM SILVER 50+MEN) TABS, Take 1 tablet by mouth daily with breakfast., Disp: , Rfl:  .  polyethylene glycol (MIRALAX / GLYCOLAX) 17 g packet, Take 17 g by mouth daily as needed., Disp: , Rfl:  .  PRESCRIPTION MEDICATION, CPAP- At bedtime and during times of rest, Disp: , Rfl:  .  Probiotic Product (PROBIOTIC PO), Take 1 capsule by mouth daily as needed (Constipation)., Disp: , Rfl:  .  shark liver oil-cocoa butter (PREPARATION H) 0.25-88.44 % suppository, Place 1 suppository rectally as needed for hemorrhoids., Disp: , Rfl:  .  Misc Natural Products [facility](URINOZINC PROSTATE) CAPS, Take 1 capsule by mouth daily. (Patient not taking: Reported on 06/16/2020), Disp: , Rfl:   Past Medical History: Past Medical History:  Diagnosis Date  . CHF (congestive heart failure) (HCC)   . Complication of anesthesia    " I had a reaction  to versed "  . Coronary artery disease   . High cholesterol   . Hypertension   . Sleep apnea     Tobacco Use: Social History   Tobacco Use  Smoking Status Never Smoker  Smokeless Tobacco Never Used    Labs: Recent Review Flowsheet Data    Labs for ITP Cardiac and Pulmonary Rehab Latest Ref Rng & Units 04/29/2020 04/29/2020 04/29/2020 04/30/2020 04/30/2020   Cholestrol 0 - 200 mg/dL - - - - -   LDLCALC 0 - 99 mg/dL - - - - -   HDL >13>40 mg/dL - - - - -   Trlycerides <150 mg/dL - - - - -   Hemoglobin A1c 4.8 - 5.6 % - - - - -   PHART 7.35 - 7.45 7.343(L) 7.347(L) 7.368 - 7.459(H)   PCO2ART 32 - 48 mmHg 39.2 43.9 39.2 - 28.5(L)   HCO3 20.0 - 28.0 mmol/L 21.1 23.8 22.2 - 20.3   TCO2 22 - 32 mmol/L 22 25 23  - 21(L)   ACIDBASEDEF 0.0 - 2.0 mmol/L 4.0(H) 1.0 2.0 - 3.0(H)   O2SAT % 94.0 90.0 92.0 53.1 86.0      Capillary Blood Glucose: Lab Results  Component Value Date   GLUCAP 108 (H) 05/04/2020   GLUCAP 101 (H) 05/03/2020   GLUCAP 110 (H) 05/03/2020   GLUCAP 169 (H) 05/02/2020   GLUCAP 145 (H) 05/02/2020  Exercise Target Goals: Exercise Program Goal: Individual exercise prescription set using results from initial 6 min walk test and THRR while considering  patient's activity barriers and safety.   Exercise Prescription Goal: Starting with aerobic activity 30 plus minutes a day, 3 days per week for initial exercise prescription. Provide home exercise prescription and guidelines that participant acknowledges understanding prior to discharge.  Activity Barriers & Risk Stratification:  Activity Barriers & Cardiac Risk Stratification - 06/16/20 1435      Activity Barriers & Cardiac Risk Stratification   Activity Barriers Other (comment);History of Falls    Comments Prior back surgery, laminectomy and left knee discomfort after a fall years ago.    Cardiac Risk Stratification High           6 Minute Walk:  6 Minute Walk    Row Name 06/16/20 1412         6 Minute  Walk   Phase Initial     Distance 1610 feet     Walk Time 6 minutes     # of Rest Breaks 0     MPH 3.05     METS 3.44     RPE 11     Perceived Dyspnea  0     VO2 Peak 12.05     Symptoms No     Resting HR 97 bpm     Resting BP 110/70     Resting Oxygen Saturation  98 %     Exercise Oxygen Saturation  during 6 min walk 97 %     Max Ex. HR 125 bpm     Max Ex. BP 149/90     2 Minute Post BP 122/80            Oxygen Initial Assessment:   Oxygen Re-Evaluation:   Oxygen Discharge (Final Oxygen Re-Evaluation):   Initial Exercise Prescription:  Initial Exercise Prescription - 06/16/20 1500      Date of Initial Exercise RX and Referring Provider   Date 06/16/20    Referring Provider [place], Peter M, MD    Expected Discharge Date 08/14/20      Treadmill   MPH 2.8    Grade 0    Minutes 15    METs 3.14      NuStep   Level 3    SPM 85    Minutes 15    METs 2.8      Prescription Details   Frequency (times per week) 3    Duration Progress to 30 minutes of continuous aerobic without signs/symptoms of physical distress      Intensity   THRR 40-80% of Max Heartrate 57-114    Ratings of Perceived Exertion 11-13    Perceived Dyspnea 0-4      Progression   Progression Continue to progress workloads to maintain intensity without signs/symptoms of physical distress.      Resistance Training   Training Prescription Yes    Weight 4lbs.    Reps 10-15           Perform Capillary Blood Glucose checks as needed.  Exercise Prescription Changes:   Exercise Comments:   Exercise Goals and Review:   Exercise Goals    Row Name 06/16/20 1444             Exercise Goals   Increase Physical Activity Yes       Intervention Provide advice, education, support and counseling about physical activity/exercise needs.;Develop an individualized exercise prescription for aerobic and resistive training based  on initial evaluation findings, risk stratification, comorbidities  and participant's personal goals.       Expected Outcomes Short Term: Attend rehab on a regular basis to increase amount of physical activity.;Long Term: Exercising regularly at least 3-5 days a week.;Long Term: Add in home exercise to make exercise part of routine and to increase amount of physical activity.       Increase Strength and Stamina Yes       Intervention Provide advice, education, support and counseling about physical activity/exercise needs.;Develop an individualized exercise prescription for aerobic and resistive training based on initial evaluation findings, risk stratification, comorbidities and participant's personal goals.       Expected Outcomes Short Term: Increase workloads from initial exercise prescription for resistance, speed, and METs.;Short Term: Perform resistance training exercises routinely during rehab and add in resistance training at home;Long Term: Improve cardiorespiratory fitness, muscular endurance and strength as measured by increased METs and functional capacity ( )       Able to understand and use rate of perceived exertion (RPE) scale Yes       Intervention Provide education and explanation on how to use RPE scale       Expected Outcomes Short Term: Able to use RPE daily in rehab to express subjective intensity level;Long Term:  Able to use RPE to guide intensity level when exercising independently       Knowledge and understanding of Target Heart Rate Range (THRR) Yes       Intervention Provide education and explanation of THRR including how the numbers were predicted and where they are located for reference       Expected Outcomes Short Term: Able to state/look up THRR;Long Term: Able to use THRR to govern intensity when exercising independently;Short Term: Able to use daily as guideline for intensity in rehab       Able to check pulse independently Yes       Intervention Provide education and demonstration on how to check pulse in carotid and radial  arteries.;Review the importance of being able to check your own pulse for safety during independent exercise       Expected Outcomes Short Term: Able to explain why pulse checking is important during independent exercise;Long Term: Able to check pulse independently and accurately       Understanding of Exercise Prescription Yes       Intervention Provide education, explanation, and written materials on patient's individual exercise prescription       Expected Outcomes Short Term: Able to explain program exercise prescription;Long Term: Able to explain home exercise prescription to exercise independently              Exercise Goals Re-Evaluation :    Discharge Exercise Prescription (Final Exercise Prescription Changes):   Nutrition:  Target Goals: Understanding of nutrition guidelines, daily intake of sodium 1500mg , cholesterol 200mg , calories 30% from fat and 7% or less from saturated fats, daily to have 5 or more servings of fruits and vegetables.  Biometrics:  Pre Biometrics - 06/16/20 1412      Pre Biometrics   Height 5' 9.25" (1.759 m)    Weight 85.5 kg    Waist Circumference 40.5 inches    Hip Circumference 39.5 inches    Waist to Hip Ratio 1.03 %    BMI (Calculated) 27.63    Triceps Skinfold 13.5 mm    % Body Fat 27.5 %    Grip Strength 46 kg    Flexibility --   Hx of back  surgery   Single Leg Stand 13.12 seconds            Nutrition Therapy Plan and Nutrition Goals:   Nutrition Assessments:   Nutrition Goals Re-Evaluation:   Nutrition Goals Discharge (Final Nutrition Goals Re-Evaluation):   Psychosocial: Target Goals: Acknowledge presence or absence of significant depression and/or stress, maximize coping skills, provide positive support system. Participant is able to verbalize types and ability to use techniques and skills needed for reducing stress and depression.  Initial Review & Psychosocial Screening:  Initial Psych Review & Screening - 06/16/20  1555      Initial Review   Current issues with None Identified      Family Dynamics   Good Support System? Yes   Peyton Najjar has his wife for support     Barriers   Psychosocial barriers to participate in program There are no identifiable barriers or psychosocial needs.      Screening Interventions   Interventions Encouraged to exercise           Quality of Life Scores:  Quality of Life - 06/16/20 1542      Quality of Life   Select Quality of Life      Quality of Life Scores   Health/Function Pre 27.6 %    Socioeconomic Pre 30 %    Psych/Spiritual Pre 30 %    Family Pre 30 %    GLOBAL Pre 28.94 %          Scores of 19 and below usually indicate a poorer quality of life in these areas.  A difference of  2-3 points is a clinically meaningful difference.  A difference of 2-3 points in the total score of the Quality of Life Index has been associated with significant improvement in overall quality of life, self-image, physical symptoms, and general health in studies assessing change in quality of life.  PHQ-9: Recent Review Flowsheet Data    Depression screen [facility] 2/9 06/16/2020   Decreased Interest 0   Down, Depressed, Hopeless 0   PHQ - 2 Score 0     Interpretation of Total Score  Total Score Depression Severity:  1-4 = Minimal depression, 5-9 = Mild depression, 10-14 = Moderate depression, 15-19 = Moderately severe depression, 20-27 = Severe depression   Psychosocial Evaluation and Intervention:   Psychosocial Re-Evaluation:   Psychosocial Discharge (Final Psychosocial Re-Evaluation):   Vocational Rehabilitation: Provide vocational rehab assistance to qualifying candidates.   Vocational Rehab Evaluation & Intervention:  Vocational Rehab - 06/16/20 1600      Initial Vocational Rehab Evaluation & Intervention   Assessment shows need for Vocational Rehabilitation No           Education: Education Goals: Education classes will be provided on a weekly basis,  covering required topics. Participant will state understanding/return demonstration of topics presented.  Learning Barriers/Preferences:  Learning Barriers/Preferences - 06/16/20 1600      Learning Barriers/Preferences   Learning Barriers None    Learning Preferences Pictoral;Skilled Demonstration;Video;Written Material           Education Topics: Hypertension, Hypertension Reduction -Define heart disease and high blood pressure. Discus how high blood pressure affects the body and ways to reduce high blood pressure.   Exercise and Your Heart -Discuss why it is important to exercise, the FITT principles of exercise, normal and abnormal responses to exercise, and how to exercise safely.   Angina -Discuss definition of angina, causes of angina, treatment of angina, and how to decrease risk of having  angina.   Cardiac Medications -Review what the following cardiac medications are used for, how they affect the body, and side effects that may occur when taking the medications.  Medications include Aspirin, Beta blockers, calcium channel blockers, ACE Inhibitors, angiotensin receptor blockers, diuretics, digoxin, and antihyperlipidemics.   Congestive Heart Failure -Discuss the definition of CHF, how to live with CHF, the signs and symptoms of CHF, and how keep track of weight and sodium intake.   Heart Disease and Intimacy -Discus the effect sexual activity has on the heart, how changes occur during intimacy as we age, and safety during sexual activity.   Smoking Cessation / COPD -Discuss different methods to quit smoking, the health benefits of quitting smoking, and the definition of COPD.   Nutrition I: Fats -Discuss the types of cholesterol, what cholesterol does to the heart, and how cholesterol levels can be controlled.   Nutrition II: Labels -Discuss the different components of food labels and how to read food label   Heart Parts/Heart Disease and PAD -Discuss the  anatomy of the heart, the pathway of blood circulation through the heart, and these are affected by heart disease.   Stress I: Signs and Symptoms -Discuss the causes of stress, how stress may lead to anxiety and depression, and ways to limit stress.   Stress II: Relaxation -Discuss different types of relaxation techniques to limit stress.   Warning Signs of Stroke / TIA -Discuss definition of a stroke, what the signs and symptoms are of a stroke, and how to identify when someone is having stroke.   Knowledge Questionnaire Score:  Knowledge Questionnaire Score - 06/16/20 1534      Knowledge Questionnaire Score   Pre Score 20/24           Core Components/Risk Factors/Patient Goals at Admission:  Personal Goals and Risk Factors at Admission - 06/16/20 1603      Core Components/Risk Factors/Patient Goals on Admission    Weight Management Yes;Weight Maintenance    Intervention Weight Management: Develop a combined nutrition and exercise program designed to reach desired caloric intake, while maintaining appropriate intake of nutrient and fiber, sodium and fats, and appropriate energy expenditure required for the weight goal.;Weight Management: Provide education and appropriate resources to help participant work on and attain dietary goals.    Expected Outcomes Short Term: Continue to assess and modify interventions until short term weight is achieved;Long Term: Adherence to nutrition and physical activity/exercise program aimed toward attainment of established weight goal;Weight Maintenance: Understanding of the daily nutrition guidelines, which includes 25-35% calories from fat, 7% or less cal from saturated fats, less than  cholesterol, less than 1.5gm of sodium, & 5 or more servings of fruits and vegetables daily;Understanding recommendations for meals to include 15-35% energy as protein, 25-35% energy from fat, 35-60% energy from carbohydrates, less than  of dietary  cholesterol, 20-35 gm of total fiber daily;Understanding of distribution of calorie intake throughout the day with the consumption of 4-5 meals/snacks    Hypertension Yes    Intervention Provide education on lifestyle modifcations including regular physical activity/exercise, weight management, moderate sodium restriction and increased consumption of fresh fruit, vegetables, and low fat dairy, alcohol moderation, and smoking cessation.;Monitor prescription use compliance.    Expected Outcomes Short Term: Continued assessment and intervention until BP is < 140/44mm HG in hypertensive participants. < 130/73mm HG in hypertensive participants with diabetes, heart failure or chronic kidney disease.;Long Term: Maintenance of blood pressure at goal levels.    Lipids Yes  Intervention Provide education and support for participant on nutrition & aerobic/resistive exercise along with prescribed medications to achieve LDL 70mg , HDL >40mg .    Expected Outcomes Short Term: Participant states understanding of desired cholesterol values and is compliant with medications prescribed. Participant is following exercise prescription and nutrition guidelines.;Long Term: Cholesterol controlled with medications as prescribed, with individualized exercise RX and with personalized nutrition plan. Value goals: LDL < 70mg , HDL > 40 mg.           Core Components/Risk Factors/Patient Goals Review:    Core Components/Risk Factors/Patient Goals at Discharge (Final Review):    ITP Comments:  ITP Comments    Row Name 06/16/20 1528           ITP Comments Medical Director- Dr. 06/18/20, MD              Comments: Armanda Magic attended orientation on 06/16/2020 to review rules and guidelines for program.  Completed 6 minute walk test, Intitial ITP, and exercise prescription.  VSS. Telemetry-Sinus Rhythm with intermittent PVC's .  Asymptomatic.  Per previous note today's ECG tracings were faxed to Dr 06/18/2020 for review.  Safety measures and social distancing in place per CDC guidelines.Elvis Coil, RN,BSN 06/16/2020 4:10 PM

## 2020-06-16 NOTE — Telephone Encounter (Signed)
Spoke to [place] at cardiac rehab.She stated patient had frequent PVC's today.She will fax over strips for Dr.Jordan to review.

## 2020-06-16 NOTE — Telephone Encounter (Signed)
Maria from Cardiac Rehab called and asked to speak to Elnita Maxwell about this patient

## 2020-06-16 NOTE — Telephone Encounter (Signed)
Dr.Jordan reviewed EKG strips from cardiac rehab.No change.Continue same medications.

## 2020-06-19 ENCOUNTER — Telehealth: Payer: Self-pay | Admitting: Cardiology

## 2020-06-19 NOTE — Telephone Encounter (Signed)
fyi  Will route to primary nurse to make her aware.  Thank you

## 2020-06-19 NOTE — Telephone Encounter (Signed)
Maralyn Sago called and wanted to let Dr. [place] and Azalee Course know that she has been assigned to this patient in partnership with is [employer] She is an advocate as part of their benefits program. She just wanted to leave her information in case Dr. [place] or Azalee Course needed to get in touch with her.

## 2020-06-24 ENCOUNTER — Encounter (HOSPITAL_COMMUNITY)
Admission: RE | Admit: 2020-06-24 | Discharge: 2020-06-24 | Disposition: A | Discharge status: Home / Self Care | Payer: Commercial Managed Care - PPO | Source: Ambulatory Visit | Attending: Cardiology | Admitting: Cardiology

## 2020-06-24 ENCOUNTER — Other Ambulatory Visit: Payer: Self-pay

## 2020-06-24 VITALS — Wt 188.0 lb

## 2020-06-24 DIAGNOSIS — I213 ST elevation (STEMI) myocardial infarction of unspecified site: Secondary | ICD-10-CM | POA: Diagnosis not present

## 2020-06-24 DIAGNOSIS — Z951 Presence of aortocoronary bypass graft: Secondary | ICD-10-CM

## 2020-06-24 NOTE — Progress Notes (Signed)
Daily Session Note  Patient Details  Name: BUCK MCAFFEE MRN: [id number] Date of Birth: [birth year] Referring Provider:     CARDIAC REHAB PHASE II ORIENTATION from 06/16/2020 in [place]  Referring Provider Martinique, Peter M, MD      Encounter Date: 06/24/2020  Check In:  Session Check In - 06/24/20 1511      Check-In   Supervising physician immediately available to respond to emergencies [facility] immediately available    Physician(s) Dr. Avon Gully    Location [facility]    Staff Present Barnet Pall, RN, BSN;Portia Rollene Rotunda, RN, Milus Glazier, MS, EP-C, CCRP;Tyara Nevels, MS,ACSM CEP, Exercise Physiologist    Virtual Visit No    Medication changes reported     No    Fall or balance concerns reported    No    Tobacco Cessation No Change    Warm-up and Cool-down Performed on first and last piece of equipment    Resistance Training Performed No    VAD Patient? No    PAD/SET Patient? No      Pain Assessment   Currently in Pain? No/denies    Multiple Pain Sites No           Capillary Blood Glucose: No results found for this or any previous visit (from the past 24 hour(s)).   Exercise Prescription Changes - 06/24/20 1600      Response to Exercise   Blood Pressure (Admit) 114/62    Blood Pressure (Exercise) 150/80    Blood Pressure (Exit) 114/78    Heart Rate (Admit) 93 bpm    Heart Rate (Exercise) 128 bpm    Heart Rate (Exit) 93 bpm    Rating of Perceived Exertion (Exercise) 11    Symptoms None    Comments Pts first day of exercise. Pt over THR with inital TM setting of 2.8, reduced to 2.4 mph.     Duration Progress to 30 minutes of  aerobic without signs/symptoms of physical distress    Intensity THRR unchanged      Progression   Progression Continue to progress workloads to maintain intensity without signs/symptoms of physical distress.    Average METs 2.67      Resistance Training   Training  Prescription No    Weight --   Weights not done on Wednesday, Will do 4 lbs on 06/26/20     Treadmill   MPH 2.4    Grade 0    Minutes 15    METs 3.14      NuStep   Level 3    SPM 80    Minutes 15    METs 2.2           Social History   Tobacco Use  Smoking Status Never Smoker  Smokeless Tobacco Never Used    Goals Met:  No report of cardiac concerns or symptoms  Goals Unmet:  Not Applicable  Comments: [name] started cardiac rehab today.  Pt tolerated light exercise without difficulty. VSS,Larry did exceed his target heart rate as he was talking to the dietitian while on the treadmill. Larry's heart rate came down when his speed on the treadmill was reduced. telemetry-Sinus Rhythm with PVC's, asymptomatic.  Medication list reconciled. Pt denies barriers to medicaiton compliance.  PSYCHOSOCIAL ASSESSMENT:  PHQ-0. Pt exhibits positive coping skills, hopeful outlook with supportive family. No psychosocial needs identified at this time, no psychosocial interventions necessary.    Pt oriented to exercise equipment and routine.  Understanding verbalized. [name] hopes to complete exercise at cardiac rehab by the end of July.Barnet Pall, RN,BSN 06/25/2020 8:33 AM   Dr. Fransico Him is Medical Director for Cardiac Rehab at [facility].

## 2020-06-24 NOTE — Progress Notes (Signed)
Joshua Mack 79 y.o. male Nutrition Note  Visit Diagnosis: 04/27/20 STEMI  04/28/20 CABG x4  Past Medical History:  Diagnosis Date  . CHF (congestive heart failure) (HCC)   . Complication of anesthesia    " I had a reaction to versed "  . Coronary artery disease   . High cholesterol   . Hypertension   . Sleep apnea      Medications reviewed.   Current Outpatient Medications:  .  acetaminophen (TYLENOL) 500 MG tablet, Take 500 mg by mouth daily as needed for mild pain or headache. , Disp: , Rfl:  .  aspirin EC 81 MG EC tablet, Take 1 tablet (81 mg total) by mouth daily., Disp:  , Rfl:  .  atorvastatin (LIPITOR) 40 MG tablet, Take 1 tablet (40 mg total) by mouth daily., Disp: 30 tablet, Rfl: 3 .  clopidogrel (PLAVIX) 75 MG tablet, Take 1 tablet (75 mg total) by mouth daily., Disp: 30 tablet, Rfl: 3 .  Melatonin 5 MG TABS, Take 5 mg by mouth at bedtime., Disp: , Rfl:  .  Misc Natural Products [facility] PROSTATE) CAPS, Take 1 capsule by mouth daily. (Patient not taking: Reported on 06/16/2020), Disp: , Rfl:  .  Multiple Vitamins-Minerals (CENTRUM SILVER 50+MEN) TABS, Take 1 tablet by mouth daily with breakfast., Disp: , Rfl:  .  polyethylene glycol (MIRALAX / GLYCOLAX) 17 g packet, Take 17 g by mouth daily as needed., Disp: , Rfl:  .  PRESCRIPTION MEDICATION, CPAP- At bedtime and during times of rest, Disp: , Rfl:  .  Probiotic Product (PROBIOTIC PO), Take 1 capsule by mouth daily as needed (Constipation)., Disp: , Rfl:  .  shark liver oil-cocoa butter (PREPARATION H) 0.25-88.44 % suppository, Place 1 suppository rectally as needed for hemorrhoids., Disp: , Rfl:    Ht Readings from Last 1 Encounters:  06/16/20 5' 9.25" (1.759 m)     Wt Readings from Last 3 Encounters:  06/16/20 188 lb 7.9 oz (85.5 kg)  06/04/20 191 lb 6.4 oz (86.8 kg)  05/22/20 190 lb (86.2 kg)     There is no height or weight on file to calculate BMI.   Social History   Tobacco Use  Smoking Status  Never Smoker  Smokeless Tobacco Never Used     Lab Results  Component Value Date   CHOL 280 (H) 04/27/2020   Lab Results  Component Value Date   HDL 57 04/27/2020   Lab Results  Component Value Date   LDLCALC NOT CALCULATED 04/27/2020   Lab Results  Component Value Date   TRIG 149 04/27/2020     Lab Results  Component Value Date   HGBA1C 5.7 (H) 04/27/2020     CBG (last 3)  No results for input(s): GLUCAP in the last 72 hours.   Nutrition Note  Spoke with pt. Nutrition Plan and Nutrition Survey goals reviewed with pt. Pt is following a Heart Healthy diet. Pt wants to lose wt. Pt lost 35 lbs when he made heart healthy diet changes (reduced sodium to 1500 mg/day, less saturated fats, eliminated eating out). He was not specifically trying to lose weight but is happy with how he feels. He states wanting to lose another 10-15 lbs.  Pt has Pre-diabetes. Last A1c indicates blood glucose well-controlled.   Pt with dx of CHF. Per discussion, pt does not use canned/convenience foods often. Pt does not add salt to food. Pt does not eat out frequently.   Pt expressed understanding of the  information reviewed..   Nutrition Diagnosis ? Food-and nutrition-related knowledge deficit related to lack of exposure to information as related to diagnosis of: ? CVD ?   Nutrition Intervention ? Pt's individual nutrition plan reviewed with pt. ? Benefits of adopting Heart Healthy diet discussed when Medficts reviewed.   ? Pt given handouts for: ? Nutrition I class ? Nutrition II class ?  ? Continue client-centered nutrition education by RD, as part of interdisciplinary care.  Goal(s) ? Pt to build a healthy plate including vegetables, fruits, whole grains, and low-fat dairy products in a heart healthy meal plan. ? Pt to achieve flexibility with diet to maintain diet over lifespan. Plan:   Will provide client-centered nutrition education as part of interdisciplinary care  Monitor and  evaluate progress toward nutrition goal with team.   Andrey Campanile, MS, RDN, LDN

## 2020-06-25 ENCOUNTER — Ambulatory Visit (HOSPITAL_COMMUNITY): Payer: Commercial Managed Care - PPO

## 2020-06-26 ENCOUNTER — Encounter (HOSPITAL_COMMUNITY)
Admission: RE | Admit: 2020-06-26 | Discharge: 2020-06-26 | Disposition: A | Discharge status: Home / Self Care | Payer: Commercial Managed Care - PPO | Source: Ambulatory Visit | Attending: Cardiology | Admitting: Cardiology

## 2020-06-26 ENCOUNTER — Other Ambulatory Visit: Payer: Self-pay

## 2020-06-26 DIAGNOSIS — I213 ST elevation (STEMI) myocardial infarction of unspecified site: Secondary | ICD-10-CM | POA: Diagnosis not present

## 2020-06-26 DIAGNOSIS — Z951 Presence of aortocoronary bypass graft: Secondary | ICD-10-CM

## 2020-06-29 ENCOUNTER — Encounter (HOSPITAL_COMMUNITY)
Admission: RE | Admit: 2020-06-29 | Discharge: 2020-06-29 | Disposition: A | Discharge status: Home / Self Care | Payer: Commercial Managed Care - PPO | Source: Ambulatory Visit | Attending: Cardiology | Admitting: Cardiology

## 2020-06-29 ENCOUNTER — Other Ambulatory Visit: Payer: Self-pay

## 2020-06-29 ENCOUNTER — Ambulatory Visit (HOSPITAL_COMMUNITY): Payer: Commercial Managed Care - PPO

## 2020-06-29 DIAGNOSIS — I5041 Acute combined systolic (congestive) and diastolic (congestive) heart failure: Secondary | ICD-10-CM

## 2020-06-29 DIAGNOSIS — I2109 ST elevation (STEMI) myocardial infarction involving other coronary artery of anterior wall: Secondary | ICD-10-CM

## 2020-06-29 DIAGNOSIS — I213 ST elevation (STEMI) myocardial infarction of unspecified site: Secondary | ICD-10-CM | POA: Diagnosis not present

## 2020-06-29 DIAGNOSIS — I2510 Atherosclerotic heart disease of native coronary artery without angina pectoris: Secondary | ICD-10-CM

## 2020-07-01 ENCOUNTER — Encounter (HOSPITAL_COMMUNITY)
Admission: RE | Admit: 2020-07-01 | Discharge: 2020-07-01 | Disposition: A | Discharge status: Home / Self Care | Payer: Commercial Managed Care - PPO | Source: Ambulatory Visit | Attending: Cardiology | Admitting: Cardiology

## 2020-07-01 ENCOUNTER — Other Ambulatory Visit: Payer: Self-pay

## 2020-07-01 ENCOUNTER — Ambulatory Visit (HOSPITAL_COMMUNITY): Payer: Commercial Managed Care - PPO

## 2020-07-01 DIAGNOSIS — Z951 Presence of aortocoronary bypass graft: Secondary | ICD-10-CM

## 2020-07-01 DIAGNOSIS — I213 ST elevation (STEMI) myocardial infarction of unspecified site: Secondary | ICD-10-CM | POA: Diagnosis not present

## 2020-07-03 ENCOUNTER — Ambulatory Visit (HOSPITAL_COMMUNITY): Payer: Commercial Managed Care - PPO

## 2020-07-03 ENCOUNTER — Telehealth (HOSPITAL_COMMUNITY): Payer: Self-pay | Admitting: Family Medicine

## 2020-07-03 ENCOUNTER — Telehealth (HOSPITAL_COMMUNITY): Payer: Self-pay | Admitting: *Deleted

## 2020-07-03 ENCOUNTER — Encounter (HOSPITAL_COMMUNITY): Payer: Commercial Managed Care - PPO

## 2020-07-03 NOTE — Telephone Encounter (Signed)
Spoke to Joshua Mack he says that the back of his neck and right arm have been bothering him. Peyton Najjar says she feels it may be related to using weights post exercise this past Monday and Friday of last week. Advise patient to follow up with his primary care physician Dr Tiburcio Pea and to hold of on returning to exercise until he is evaluated and feels better. Patient states understanding and states he feels good otherwise. Peyton Najjar said that he walked 30 minutes earlier today.Gladstone Lighter, RN,BSN 07/03/2020 1:40 PM

## 2020-07-06 ENCOUNTER — Ambulatory Visit (HOSPITAL_COMMUNITY): Payer: Commercial Managed Care - PPO

## 2020-07-06 ENCOUNTER — Telehealth: Payer: Self-pay | Admitting: Cardiology

## 2020-07-06 ENCOUNTER — Encounter (HOSPITAL_COMMUNITY): Payer: Commercial Managed Care - PPO

## 2020-07-06 NOTE — Telephone Encounter (Signed)
Patient started cardiac rehab 2 weeks ago. He started coming home hurting form using the weights. He started using a stationary bike that involves using your arms when you ride. He started having neck, shoulder and arm pain last Friday due to this. Cardiac Rehab told him that he needed to follow up with his doctor before coming back - specifically his PCP. Patient's wife doesn't think his PCP can do anything about this and would rather him come see Dr. Swaziland or Azalee Course. Please advise what would be best for him to do.

## 2020-07-06 NOTE — Telephone Encounter (Signed)
Spoke with pt and wife, he has right side neck, shoulder and arm pain that hurts all the time. The pain is bad first thing in the morning and then tylenol will help decrease the pain. The discomfort started after lifting weights and using arms at rehab. Aware does sound like a medical doctor issue. They will contact their medical doctor regarding symptoms. Cautioned patient regarding using ibuprofen for an extended period of time. Pt agreed with this plan.

## 2020-07-07 ENCOUNTER — Telehealth (HOSPITAL_COMMUNITY): Payer: Self-pay | Admitting: *Deleted

## 2020-07-07 NOTE — Telephone Encounter (Signed)
Spoke with Joshua Mack. Joshua Mack said that his primary care physician Dr Katrinka Blazing said  that he likely had a muscle strain. Joshua Mack has been advised to hold off on doing any upper arm exercise and use the treadmill only. Joshua Mack said he would like to discontinue phase 2 cardiac rehab at this time. Will discharge from the program. Joshua Mack mentioned possibly participating in virtual cardiac rehab. I asked Joshua Mack to hold off from participating in virtual cardiac rehab until he is feeling better. Joshua Mack attended 4 exercise sessions between 06/24/20-07/01/20.Gladstone Lighter, RN,BSN 07/07/2020 3:07 PM

## 2020-07-08 ENCOUNTER — Encounter (HOSPITAL_COMMUNITY): Payer: Commercial Managed Care - PPO

## 2020-07-08 ENCOUNTER — Ambulatory Visit (HOSPITAL_COMMUNITY): Payer: Commercial Managed Care - PPO

## 2020-07-10 ENCOUNTER — Ambulatory Visit (HOSPITAL_COMMUNITY): Payer: Commercial Managed Care - PPO

## 2020-07-10 ENCOUNTER — Encounter (HOSPITAL_COMMUNITY): Payer: Commercial Managed Care - PPO

## 2020-07-11 NOTE — Progress Notes (Signed)
Cardiology Office Note:    Date:  07/14/2020   ID:  [name], DOB [birth year], MRN [id number]  PCP:  Johny Blamer, MD  [facility] Cardiologist:  Anastasios Melander [place], MD  [facility] Electrophysiologist:  None   Referring MD: Johny Blamer, MD   Chief Complaint  Patient presents with  . Coronary Artery Disease  . Congestive Heart Failure    History of Present Illness:    Joshua Mack is a 79 y.o. male with a hx of HTN, HLD, spinal stenosis, prior DVT and CAD.  Patient presented to the hospital on 04/27/2020 for chest pain and shortness of breath and found to have STEMI. Urgent cardiac catheterization performed on 04/24/2020 showed 100% mid LAD occlusion after a large D1 with faint right to left collateral, 90% ramus intermediate, 100% CTO of mid left circumflex with good left to left collaterals, 100% CTO of proximal RCA with right to right and left to right collaterals, LV gram was unable to assess LV function due to underfilling.  There was also moderately elevated LV filling pressure with preserved cardiac output of 4.4 L/min.  Patient was placed on IVBP support.  Echocardiogram obtained on the following day showed EF 30 to 35%, grade 2 DD.  Patient eventually underwent urgent CABG x4 with LIMA to LAD, SVG to PDA, SVG to D1 and SVG to OM 3 by Dr. Cliffton Asters.  Intra-aortic balloon pump was removed during the procedure.   Since discharge, patient returned to the ED on 5/17 after a brief unresponsive episode.  He returned to the hospital again on 5/22 due to sudden expression of fluid from his sternal incision.  He was seen by Dr. Vickey Sages who placed the chest tube and they removed 1300 mL of serosanguineous fluid.  Chest tube was later removed on 5/25.  Repeat limited echocardiogram obtained on 05/09/2020 showed EF 30 to 35%, trivial mitral regurgitation, mild AI.  He was seen by Azalee Course PA-C for follow-up on 05/07/2020, he was hypotensive at the time was blood pressure in the 80s.  He was  hydrated and all CHF meds were held including lasix, metoprolol, and lisinopril.   On follow up today he is seen with his wife. He is doing very well. He has significantly changed his diet and has lost 45 lbs. He is walking 30 minutes a day. BP has been normal. He denies any dizziness, palpitations, chest pain or dyspnea. He is anxious to return to work. He works as a Banker.    Past Medical History:  Diagnosis Date  . CHF (congestive heart failure) (HCC)   . Complication of anesthesia    " I had a reaction to versed "  . Coronary artery disease   . High cholesterol   . Hypertension   . Sleep apnea     Past Surgical History:  Procedure Laterality Date  . CARDIAC CATHETERIZATION    . CARDIAC SURGERY    . CHEST TUBE INSERTION Left 05/09/2020  . CORONARY ARTERY BYPASS GRAFT N/A 04/28/2020   Procedure: CORONARY ARTERY BYPASS GRAFTING (CABG) times four using LIMA to LAD; bilateral endoscopic greater saphenous vein harvest: SVG to Circ; SVG to RAMUS; and SVG to PDA.;  Surgeon: Corliss Skains, MD;  Location: [facility] OR;  Service: Open Heart Surgery;  Laterality: N/A;  . ENDOVEIN HARVEST OF GREATER SAPHENOUS VEIN Bilateral 04/28/2020   Procedure: [name];  Surgeon: Corliss Skains, MD;  Location: [facility] OR;  Service: Open Heart  Surgery;  Laterality: Bilateral;  . IABP INSERTION N/A 04/27/2020   Procedure: IABP Insertion;  Surgeon: SwazilandJordan, Yoshiko Keleher M, MD;  [facility];  Service: Cardiovascular;  Laterality: N/A;  . LUMBAR LAMINECTOMY/DECOMPRESSION MICRODISCECTOMY N/A 05/03/2017   Procedure: Microlumbar decompression L4-5, L3-4,  L5-S1 WITH LATERAL AUTOGRAFT FUSION;  Surgeon: Jene EveryBeane, Jeffrey, MD;  Location: [facility] ORS;  Service: Orthopedics;  Laterality: N/A;  . RIGHT/LEFT HEART CATH AND CORONARY ANGIOGRAPHY N/A 04/27/2020   Procedure: RIGHT/LEFT HEART CATH AND CORONARY ANGIOGRAPHY;  Surgeon: SwazilandJordan, Crimson Dubberly M, MD;  [facility];   Service: Cardiovascular;  Laterality: N/A;    Current Medications: Current Meds  Medication Sig  . acetaminophen (TYLENOL) 500 MG tablet Take 500 mg by mouth daily as needed for mild pain or headache.   Marland Kitchen. aspirin EC 81 MG EC tablet Take 1 tablet (81 mg total) by mouth daily.  . clopidogrel (PLAVIX) 75 MG tablet Take 1 tablet (75 mg total) by mouth daily.  . Melatonin 5 MG TABS Take 5 mg by mouth at bedtime.  . Multiple Vitamins-Minerals (CENTRUM SILVER 50+MEN) TABS Take 1 tablet by mouth daily with breakfast.  . polyethylene glycol (MIRALAX / GLYCOLAX) 17 g packet Take 17 g by mouth daily as needed.  Marland Kitchen. PRESCRIPTION MEDICATION CPAP- At bedtime and during times of rest  . Probiotic Product (PROBIOTIC PO) Take 1 capsule by mouth daily as needed (Constipation).  . shark liver oil-cocoa butter (PREPARATION H) 0.25-88.44 % suppository Place 1 suppository rectally as needed for hemorrhoids.  . [DISCONTINUED] atorvastatin (LIPITOR) 40 MG tablet Take 1 tablet (40 mg total) by mouth daily.     Allergies:   Clemastine, Fentanyl, Midazolam, Amoxicillin, Other, and Tavist nd [loratadine]   Social History   Socioeconomic History  . Marital status: Married    Spouse name: Not on file  . Number of children: Not on file  . Years of education: 5916  . Highest education level: Not on file  Occupational History  . Not on file  Tobacco Use  . Smoking status: Never Smoker  . Smokeless tobacco: Never Used  Vaping Use  . Vaping Use: Never used  Substance and Sexual Activity  . Alcohol use: No  . Drug use: No  . Sexual activity: Not on file  Other Topics Concern  . Not on file  Social History Narrative  . Not on file   Social Determinants of Health   Financial Resource Strain:   . Difficulty of Paying Living Expenses:   Food Insecurity:   . Worried About Programme researcher, broadcasting/film/videounning Out of Food in the Last Year:   . Baristaan Out of Food in the Last Year:   Transportation Needs:   . Freight forwarderLack of Transportation (Medical):    Marland Kitchen. Lack of Transportation (Non-Medical):   Physical Activity:   . Days of Exercise per Week:   . Minutes of Exercise per Session:   Stress:   . Feeling of Stress :   Social Connections:   . Frequency of Communication with Friends and Family:   . Frequency of Social Gatherings with Friends and Family:   . Attends Religious Services:   . Active Member of Clubs or Organizations:   . Attends BankerClub or Organization Meetings:   Marland Kitchen. Marital Status:      Family History: The patient's family history is not on file.  ROS:   Please see the history of present illness.     All other systems reviewed and are negative.  EKGs/Labs/Other Studies  Reviewed:    The following studies were reviewed today: Cath 04/27/2020  Prox RCA lesion is 100% stenosed.  Prox Cx to Mid Cx lesion is 100% stenosed.  Ramus lesion is 90% stenosed.  Mid LAD lesion is 100% stenosed.  LV end diastolic pressure is moderately elevated.  1. 3 vessel occlusive CAD. - 100% mid LAD after a large diagonal branch. Faint right to left collaterals. - 90% ramus intermediate - 100% CTO of the mid LCx with good left to left collaterals - 100% CTO of the proximal RCA. Good right to right and left to right collaterals. 2. Difficult to assess LV function due to underfilling. Will assess with Echo 3. Moderately elevated LV filling pressures. Improved with IV lasix and IABP 4. Normal right heart pressures 5. Preserved cardiac output 4.4 liters/min  Plan: transfer to ICU. Heparinize. IABP support. CT surgery consultation for CABG. Assess LV function with Echo. Continue diuresis. Trend troponin levels.    Echo 04/28/2020 IMPRESSIONS    1. No intracavitary thrombus is seen. The dyskinetic anteroapical area is  not thinned or hyperechoic, suggesting recent ischemia/infarction. Left  ventricular ejection fraction, by estimation, is 30 to 35%. The left  ventricle has moderate to severely  decreased function. The  left ventricle demonstrates regional wall motion  abnormalities (see scoring diagram/findings for description). Left  ventricular diastolic parameters are consistent with Grade II diastolic  dysfunction (pseudonormalization).  Elevated left atrial pressure.  2. Right ventricular systolic function is normal. The right ventricular  size is normal. A catheter or pacemaker wire is seen in the right  ventricle. is visualized.  3. The mitral valve is normal in structure. No evidence of mitral valve  regurgitation.  4. The aortic valve is normal in structure. Aortic valve regurgitation is  not visualized.    EKG:  EKG is not ordered today.   Recent Labs: 04/27/2020: ALT 30; B Natriuretic Peptide 103.3 04/29/2020: Magnesium 2.6 05/15/2020: BUN 21; Creatinine, Ser 1.32; Hemoglobin 10.2; Platelets 792; Potassium 5.1; Sodium 140  Recent Lipid Panel    Component Value Date/Time   CHOL 280 (H) 04/27/2020 2052   TRIG 149 04/27/2020 2052   HDL 57 04/27/2020 2052   CHOLHDL 4.9 04/27/2020 2052   VLDL 30 04/27/2020 2052   LDLCALC NOT CALCULATED 04/27/2020 2052    Physical Exam:    VS:  BP 128/70   Pulse 94   Ht 5\' 9"  (1.753 m)   Wt 184 lb (83.5 kg)   SpO2 97%   BMI 27.17 kg/m     Wt Readings from Last 3 Encounters:  07/14/20 184 lb (83.5 kg)  06/25/20 188 lb (85.3 kg)  06/16/20 188 lb 7.9 oz (85.5 kg)     GEN:  Well nourished, well developed in no acute distress HEENT: Normal NECK: No JVD; No carotid bruits LYMPHATICS: No lymphadenopathy CARDIAC: RRR, no murmurs, rubs, gallops. Chest incision has healed well.  RESPIRATORY:  Clear to auscultation without rales, wheezing or rhonchi  ABDOMEN: Soft, non-tender, non-distended MUSCULOSKELETAL:  No edema; No deformity  SKIN: Warm and dry NEUROLOGIC:  Alert and oriented x 3 PSYCHIATRIC:  Normal affect   ASSESSMENT:    1. Coronary artery disease involving coronary bypass graft of native heart without angina pectoris   2. S/P CABG  x 4   3. Hypercholesteremia   4. Essential hypertension    PLAN:    In order of problems listed above:  1. CAD s/p emergent CABG in setting of STEMI in May 2021- found  to have severe 3 vessel occlusive disease: Continue aspirin and Plavix. Plan to stop Plavix after a year.  He is asymptomatic. He may drive. OK to return after August 11.   2. 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 Need to update Echo post revascularization. If EF remains low will need to consider resuming Coreg and/or Entresto at low doses. If EF is normal we can forgo this. If EF < 35% may need to consider for ICd.   3. Hypercholesterolemia: Patient is concerned that lipitor is affecting his memory. Will try switching to Crestor 20 mg daily. Will update lab work now  4. Pleural effusion: His pleural effusion has  resolved based on physical exam.   Medication Adjustments/Labs and Tests Ordered: Current medicines are reviewed at length with the patient today.  Concerns regarding medicines are outlined above.  No orders of the defined types were placed in this encounter.  No orders of the defined types were placed in this encounter.   Patient Instructions  You may drive  You may return to work after August 11 without restriction  We will switch lipitor to Crestor 20 mg daily  We will check an Echocardiogram and lab work     Signed, Marianna Cid Swaziland, MD  07/14/2020 2:28 PM    [facility]

## 2020-07-13 ENCOUNTER — Encounter (HOSPITAL_COMMUNITY): Payer: Commercial Managed Care - PPO

## 2020-07-13 ENCOUNTER — Ambulatory Visit (HOSPITAL_COMMUNITY): Payer: Commercial Managed Care - PPO

## 2020-07-14 ENCOUNTER — Encounter: Payer: Self-pay | Admitting: Cardiology

## 2020-07-14 ENCOUNTER — Ambulatory Visit (INDEPENDENT_AMBULATORY_CARE_PROVIDER_SITE_OTHER): Payer: Commercial Managed Care - PPO | Admitting: Cardiology

## 2020-07-14 ENCOUNTER — Other Ambulatory Visit: Payer: Self-pay

## 2020-07-14 VITALS — BP 128/70 | HR 94 | Ht 69.0 in | Wt 184.0 lb

## 2020-07-14 DIAGNOSIS — I5041 Acute combined systolic (congestive) and diastolic (congestive) heart failure: Secondary | ICD-10-CM

## 2020-07-14 DIAGNOSIS — I10 Essential (primary) hypertension: Secondary | ICD-10-CM | POA: Diagnosis not present

## 2020-07-14 DIAGNOSIS — E7800 Pure hypercholesterolemia, unspecified: Secondary | ICD-10-CM

## 2020-07-14 DIAGNOSIS — Z951 Presence of aortocoronary bypass graft: Secondary | ICD-10-CM | POA: Diagnosis not present

## 2020-07-14 DIAGNOSIS — I25810 Atherosclerosis of coronary artery bypass graft(s) without angina pectoris: Secondary | ICD-10-CM | POA: Diagnosis not present

## 2020-07-14 MED ORDER — ROSUVASTATIN CALCIUM 20 MG PO TABS
20.0000 mg | ORAL_TABLET | Freq: Every day | ORAL | 3 refills | Status: DC
Start: 2020-07-14 — End: 2021-07-23

## 2020-07-14 NOTE — Patient Instructions (Signed)
You may drive  You may return to work after August 11 without restriction  We will switch lipitor to Crestor 20 mg daily  We will check an Echocardiogram and lab work

## 2020-07-15 ENCOUNTER — Encounter (HOSPITAL_COMMUNITY): Payer: Commercial Managed Care - PPO

## 2020-07-15 ENCOUNTER — Ambulatory Visit (HOSPITAL_COMMUNITY): Payer: Commercial Managed Care - PPO

## 2020-07-15 LAB — HEPATIC FUNCTION PANEL
ALT: 15 IU/L (ref 0–44)
AST: 20 IU/L (ref 0–40)
Albumin: 4.5 g/dL (ref 3.7–4.7)
Alkaline Phosphatase: 106 IU/L (ref 48–121)
Bilirubin Total: 0.4 mg/dL (ref 0.0–1.2)
Bilirubin, Direct: 0.16 mg/dL (ref 0.00–0.40)
Total Protein: 7.5 g/dL (ref 6.0–8.5)

## 2020-07-15 LAB — LIPID PANEL
Chol/HDL Ratio: 2.1 ratio (ref 0.0–5.0)
Cholesterol, Total: 115 mg/dL (ref 100–199)
HDL: 54 mg/dL (ref >39)
LDL Chol Calc (NIH): 45 mg/dL (ref 0–99)
Triglycerides: 81 mg/dL (ref 0–149)
VLDL Cholesterol Cal: 16 mg/dL (ref 5–40)

## 2020-07-15 LAB — BASIC METABOLIC PANEL
BUN/Creatinine Ratio: 10 (ref 10–24)
BUN: 10 mg/dL (ref 8–27)
CO2: 23 mmol/L (ref 20–29)
Calcium: 9.6 mg/dL (ref 8.6–10.2)
Chloride: 99 mmol/L (ref 96–106)
Creatinine, Ser: 0.98 mg/dL (ref 0.76–1.27)
GFR calc Af Amer: 85 mL/min/{1.73_m2} (ref >59)
GFR calc non Af Amer: 74 mL/min/{1.73_m2} (ref >59)
Glucose: 89 mg/dL (ref 65–99)
Potassium: 4.5 mmol/L (ref 3.5–5.2)
Sodium: 139 mmol/L (ref 134–144)

## 2020-07-16 ENCOUNTER — Ambulatory Visit (HOSPITAL_COMMUNITY): Payer: Commercial Managed Care - PPO | Attending: Cardiovascular Disease

## 2020-07-16 ENCOUNTER — Other Ambulatory Visit: Payer: Self-pay

## 2020-07-16 DIAGNOSIS — I10 Essential (primary) hypertension: Secondary | ICD-10-CM | POA: Diagnosis not present

## 2020-07-16 DIAGNOSIS — I5041 Acute combined systolic (congestive) and diastolic (congestive) heart failure: Secondary | ICD-10-CM | POA: Diagnosis present

## 2020-07-16 DIAGNOSIS — Z951 Presence of aortocoronary bypass graft: Secondary | ICD-10-CM

## 2020-07-16 DIAGNOSIS — I25810 Atherosclerosis of coronary artery bypass graft(s) without angina pectoris: Secondary | ICD-10-CM | POA: Diagnosis not present

## 2020-07-16 DIAGNOSIS — E7800 Pure hypercholesterolemia, unspecified: Secondary | ICD-10-CM | POA: Insufficient documentation

## 2020-07-16 LAB — ECHOCARDIOGRAM COMPLETE
Area-P 1/2: 5.54 cm2
S' Lateral: 3.6 cm

## 2020-07-16 MED ORDER — PERFLUTREN LIPID MICROSPHERE
1.0000 mL | INTRAVENOUS | Status: AC | PRN
  Administered 2020-07-16: 1 mL via INTRAVENOUS

## 2020-07-17 ENCOUNTER — Encounter (HOSPITAL_COMMUNITY): Payer: Commercial Managed Care - PPO

## 2020-07-17 ENCOUNTER — Ambulatory Visit (HOSPITAL_COMMUNITY): Payer: Commercial Managed Care - PPO

## 2020-07-20 ENCOUNTER — Ambulatory Visit (HOSPITAL_COMMUNITY): Payer: Commercial Managed Care - PPO

## 2020-07-20 ENCOUNTER — Encounter (HOSPITAL_COMMUNITY): Payer: Commercial Managed Care - PPO

## 2020-07-21 ENCOUNTER — Other Ambulatory Visit: Payer: Self-pay

## 2020-07-21 MED ORDER — ENTRESTO 24-26 MG PO TABS: 1.0000 | ORAL_TABLET | Freq: Two times a day (BID) | ORAL | 6 refills | Status: DC

## 2020-07-21 NOTE — Progress Notes (Signed)
entresto

## 2020-07-22 ENCOUNTER — Ambulatory Visit (HOSPITAL_COMMUNITY): Payer: Commercial Managed Care - PPO

## 2020-07-22 ENCOUNTER — Encounter (HOSPITAL_COMMUNITY): Payer: Commercial Managed Care - PPO

## 2020-07-24 ENCOUNTER — Ambulatory Visit (HOSPITAL_COMMUNITY): Payer: Commercial Managed Care - PPO

## 2020-07-24 ENCOUNTER — Encounter (HOSPITAL_COMMUNITY): Payer: Commercial Managed Care - PPO

## 2020-07-24 ENCOUNTER — Other Ambulatory Visit: Payer: Self-pay | Admitting: Physician Assistant

## 2020-07-27 ENCOUNTER — Ambulatory Visit (HOSPITAL_COMMUNITY): Payer: Commercial Managed Care - PPO

## 2020-07-27 ENCOUNTER — Encounter (HOSPITAL_COMMUNITY): Payer: Commercial Managed Care - PPO

## 2020-07-29 ENCOUNTER — Encounter (HOSPITAL_COMMUNITY): Payer: Commercial Managed Care - PPO

## 2020-07-29 ENCOUNTER — Ambulatory Visit (HOSPITAL_COMMUNITY): Payer: Commercial Managed Care - PPO

## 2020-07-31 ENCOUNTER — Encounter (HOSPITAL_COMMUNITY): Payer: Commercial Managed Care - PPO

## 2020-07-31 ENCOUNTER — Ambulatory Visit (HOSPITAL_COMMUNITY): Payer: Commercial Managed Care - PPO

## 2020-08-01 ENCOUNTER — Other Ambulatory Visit: Payer: Self-pay | Admitting: Physician Assistant

## 2020-08-03 ENCOUNTER — Encounter (HOSPITAL_COMMUNITY): Payer: Commercial Managed Care - PPO

## 2020-08-03 ENCOUNTER — Ambulatory Visit (HOSPITAL_COMMUNITY): Payer: Commercial Managed Care - PPO

## 2020-08-04 ENCOUNTER — Ambulatory Visit: Payer: Commercial Managed Care - PPO

## 2020-08-05 ENCOUNTER — Ambulatory Visit (HOSPITAL_COMMUNITY): Payer: Commercial Managed Care - PPO

## 2020-08-05 ENCOUNTER — Encounter (HOSPITAL_COMMUNITY): Payer: Commercial Managed Care - PPO

## 2020-08-07 ENCOUNTER — Other Ambulatory Visit: Payer: Self-pay | Admitting: Physician Assistant

## 2020-08-07 ENCOUNTER — Encounter (HOSPITAL_COMMUNITY): Payer: Commercial Managed Care - PPO

## 2020-08-07 ENCOUNTER — Ambulatory Visit (HOSPITAL_COMMUNITY): Payer: Commercial Managed Care - PPO

## 2020-08-10 ENCOUNTER — Ambulatory Visit (HOSPITAL_COMMUNITY): Payer: Commercial Managed Care - PPO

## 2020-08-10 ENCOUNTER — Encounter (HOSPITAL_COMMUNITY): Payer: Commercial Managed Care - PPO

## 2020-08-12 ENCOUNTER — Ambulatory Visit (HOSPITAL_COMMUNITY): Payer: Commercial Managed Care - PPO

## 2020-08-12 ENCOUNTER — Encounter (HOSPITAL_COMMUNITY): Payer: Commercial Managed Care - PPO

## 2020-08-14 ENCOUNTER — Encounter (HOSPITAL_COMMUNITY): Payer: Commercial Managed Care - PPO

## 2020-08-14 ENCOUNTER — Ambulatory Visit (HOSPITAL_COMMUNITY): Payer: Commercial Managed Care - PPO

## 2020-08-17 ENCOUNTER — Ambulatory Visit (HOSPITAL_COMMUNITY): Payer: Commercial Managed Care - PPO

## 2020-08-19 ENCOUNTER — Ambulatory Visit (HOSPITAL_COMMUNITY): Payer: Commercial Managed Care - PPO

## 2020-08-21 ENCOUNTER — Ambulatory Visit: Payer: Commercial Managed Care - PPO | Admitting: Cardiology

## 2020-08-21 ENCOUNTER — Ambulatory Visit (HOSPITAL_COMMUNITY): Payer: Commercial Managed Care - PPO

## 2020-08-28 ENCOUNTER — Other Ambulatory Visit: Payer: Self-pay

## 2020-08-28 ENCOUNTER — Other Ambulatory Visit: Payer: Self-pay | Admitting: Physician Assistant

## 2020-08-28 MED ORDER — CLOPIDOGREL BISULFATE 75 MG PO TABS: 75.0000 mg | ORAL_TABLET | Freq: Every day | ORAL | 3 refills | Status: DC

## 2020-09-14 ENCOUNTER — Telehealth: Payer: Self-pay | Admitting: Cardiology

## 2020-09-14 NOTE — Telephone Encounter (Signed)
We are recommending the COVID-19 vaccine to all of our patients. Cardiac medications (including blood thinners) should not deter anyone from being vaccinated and there is no need to hold any of those medications prior to vaccine administration.     Currently, there is a hotline to call (active 12/27/19) to schedule vaccination appointments as no walk-ins will be accepted.   Number: [phone].    If an appointment is not available please go to [email or web address] to sign up for notification when additional vaccine appointments are available.   If you have further questions or concerns about the vaccine process, please visit [email or web address] or contact your primary care physician.   

## 2020-10-31 NOTE — Progress Notes (Signed)
Cardiology Office Note:    Date:  11/02/2020   ID:  Joshua Mack, DOB Jul 10, 1941, MRN [id number]  PCP:  [name], [name], MD  [facility] Cardiologist:  Erryn Dickison [place], MD  [facility] Electrophysiologist:  None   Referring MD: [name], [name], MD   Chief Complaint  Patient presents with  . Congestive Heart Failure  . Coronary Artery Disease    History of Present Illness:    Joshua Mack is a 79 y.o. male with a hx of HTN, HLD, spinal stenosis, prior DVT and CAD.  Patient presented to the hospital on 04/27/2020 for chest pain and shortness of breath and found to have STEMI. Urgent cardiac catheterization performed on 04/24/2020 showed 100% mid LAD occlusion after a large D1 with faint right to left collateral, 90% ramus intermediate, 100% CTO of mid left circumflex with good left to left collaterals, 100% CTO of proximal RCA with right to right and left to right collaterals, LV gram was unable to assess LV function due to underfilling.  There was also moderately elevated LV filling pressure with preserved cardiac output of 4.4 L/min.  Patient was placed on IVBP support.  Echocardiogram obtained on the following day showed EF 30 to 35%, grade 2 DD.  Patient eventually underwent urgent CABG x4 with LIMA to LAD, SVG to PDA, SVG to D1 and SVG to OM 3 by Dr. Cliffton AstersLightfoot.  Intra-aortic balloon pump was removed during the procedure.   Since discharge, patient returned to the ED on 5/17 after a brief unresponsive episode.  He returned to the hospital again on 5/22 due to sudden expression of fluid from his sternal incision.  He was seen by Dr. Vickey SagesAtkins who placed the chest tube and they removed 1300 mL of serosanguineous fluid.  Chest tube was later removed on 5/25.  Repeat limited echocardiogram obtained on 05/09/2020 showed EF 30 to 35%, trivial mitral regurgitation, mild AI.  He was seen by Azalee CourseHao Meng PA-C for follow-up on 05/07/2020, he was hypotensive at the time was blood pressure in the 80s.  He was  hydrated and all CHF meds were held including lasix, metoprolol, and lisinopril. Follow up Echo in July showed EF 35-40%. We recommended resuming Entresto  at 24/26 mg bid.   On follow up today he reports he feels the best he has in 10 years. He has lost another 10 lbs.  He is doing very well.  He is walking 30 minutes a day on a treadmill. BP has been normal. He denies any dizziness, palpitations, chest pain or dyspnea. He is back working full time.  He works as a Bankerbroadcast engineer. He states he never started on Entresto because he read an article that suggested a link to Alzheimer's.    Past Medical History:  Diagnosis Date  . CHF (congestive heart failure) (HCC)   . Complication of anesthesia    " I had a reaction to versed "  . Coronary artery disease   . High cholesterol   . Hypertension   . Sleep apnea     Past Surgical History:  Procedure Laterality Date  . CARDIAC CATHETERIZATION    . CARDIAC SURGERY    . CHEST TUBE INSERTION Left 05/09/2020  . CORONARY ARTERY BYPASS GRAFT N/A 04/28/2020   Procedure: CORONARY ARTERY BYPASS GRAFTING (CABG) times four using LIMA to LAD; bilateral endoscopic greater saphenous vein harvest: SVG to Circ; SVG to RAMUS; and SVG to PDA.;  Surgeon: Corliss SkainsLightfoot, Harrell O, MD;  Location: [facility] OR;  Service: Open Heart Surgery;  Laterality: N/A;  . ENDOVEIN HARVEST OF GREATER SAPHENOUS VEIN Bilateral 04/28/2020   Procedure: [name];  Surgeon: Corliss Skains, MD;  Location: [facility] OR;  Service: Open Heart Surgery;  Laterality: Bilateral;  . IABP INSERTION N/A 04/27/2020   Procedure: IABP Insertion;  Surgeon: [place], Randen Kauth M, MD;  Location: [facility] INVASIVE CV LAB;  Service: Cardiovascular;  Laterality: N/A;  . LUMBAR LAMINECTOMY/DECOMPRESSION MICRODISCECTOMY N/A 05/03/2017   Procedure: Microlumbar decompression L4-5, L3-4,  L5-S1 WITH LATERAL AUTOGRAFT FUSION;  Surgeon: Jene Every, MD;  Location: [facility] ORS;  Service: Orthopedics;   Laterality: N/A;  . RIGHT/LEFT HEART CATH AND CORONARY ANGIOGRAPHY N/A 04/27/2020   Procedure: RIGHT/LEFT HEART CATH AND CORONARY ANGIOGRAPHY;  Surgeon: [place], Monea Pesantez M, MD;  Location: [facility] INVASIVE CV LAB;  Service: Cardiovascular;  Laterality: N/A;    Current Medications: Current Meds  Medication Sig  . Cholecalciferol (VITAMIN D-3) 25 MCG (1000 UT) CAPS   . Misc Natural Products (OSTEO BI-FLEX/5-LOXIN ADVANCED PO) Take 500 mg by mouth.     Allergies:   Clemastine, Fentanyl, Midazolam, Amoxicillin, Other, and Tavist nd [loratadine]   Social History   Socioeconomic History  . Marital status: Married    Spouse name: Not on file  . Number of children: Not on file  . Years of education: 49  . Highest education level: Not on file  Occupational History  . Not on file  Tobacco Use  . Smoking status: Never Smoker  . Smokeless tobacco: Never Used  Vaping Use  . Vaping Use: Never used  Substance and Sexual Activity  . Alcohol use: No  . Drug use: No  . Sexual activity: Not on file  Other Topics Concern  . Not on file  Social History Narrative  . Not on file   Social Determinants of Health   Financial Resource Strain:   . Difficulty of Paying Living Expenses: Not on file  Food Insecurity:   . Worried About Programme researcher, broadcasting/film/video in the Last Year: Not on file  . Ran Out of Food in the Last Year: Not on file  Transportation Needs:   . Lack of Transportation (Medical): Not on file  . Lack of Transportation (Non-Medical): Not on file  Physical Activity:   . Days of Exercise per Week: Not on file  . Minutes of Exercise per Session: Not on file  Stress:   . Feeling of Stress : Not on file  Social Connections:   . Frequency of Communication with Friends and Family: Not on file  . Frequency of Social Gatherings with Friends and Family: Not on file  . Attends Religious Services: Not on file  . Active Member of Clubs or Organizations: Not on file  . Attends Banker  Meetings: Not on file  . Marital Status: Not on file     Family History: The patient's family history is not on file.  ROS:   Please see the history of present illness.     All other systems reviewed and are negative.  EKGs/Labs/Other Studies Reviewed:    The following studies were reviewed today: Cath 04/27/2020  Prox RCA lesion is 100% stenosed.  Prox Cx to Mid Cx lesion is 100% stenosed.  Ramus lesion is 90% stenosed.  Mid LAD lesion is 100% stenosed.  LV end diastolic pressure is moderately elevated.  1. 3 vessel occlusive CAD. - 100% mid LAD after a large diagonal branch. Faint right to left collaterals. -  90% ramus intermediate - 100% CTO of the mid LCx with good left to left collaterals - 100% CTO of the proximal RCA. Good right to right and left to right collaterals. 2. Difficult to assess LV function due to underfilling. Will assess with Echo 3. Moderately elevated LV filling pressures. Improved with IV lasix and IABP 4. Normal right heart pressures 5. Preserved cardiac output 4.4 liters/min  Plan: transfer to ICU. Heparinize. IABP support. CT surgery consultation for CABG. Assess LV function with Echo. Continue diuresis. Trend troponin levels.    Echo 04/28/2020 IMPRESSIONS    1. No intracavitary thrombus is seen. The dyskinetic anteroapical area is  not thinned or hyperechoic, suggesting recent ischemia/infarction. Left  ventricular ejection fraction, by estimation, is 30 to 35%. The left  ventricle has moderate to severely  decreased function. The left ventricle demonstrates regional wall motion  abnormalities (see scoring diagram/findings for description). Left  ventricular diastolic parameters are consistent with Grade II diastolic  dysfunction (pseudonormalization).  Elevated left atrial pressure.  2. Right ventricular systolic function is normal. The right ventricular  size is normal. A catheter or pacemaker wire is seen in the  right  ventricle. is visualized.  3. The mitral valve is normal in structure. No evidence of mitral valve  regurgitation.  4. The aortic valve is normal in structure. Aortic valve regurgitation is  not visualized.   Echo 07/16/20: IMPRESSIONS    1. Septal and apical akinesis mid and basal inferior wall hypokinesis EF  appears slightly improved since CABG . Left ventricular ejection fraction,  by estimation, is 35 to 40%. The left ventricle has moderately decreased  function. The left ventricle  demonstrates regional wall motion abnormalities (see scoring  diagram/findings for description). The left ventricular internal cavity  size was moderately dilated. Left ventricular diastolic parameters were  normal.  2. Right ventricular systolic function is normal. The right ventricular  size is normal.  3. Left atrial size was mild to moderately dilated.  4. The mitral valve is normal in structure. No evidence of mitral valve  regurgitation. No evidence of mitral stenosis.  5. The aortic valve was not well visualized. Aortic valve regurgitation  is not visualized. Mild to moderate aortic valve sclerosis/calcification  is present, without any evidence of aortic stenosis.  6. Aortic dilatation noted. 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  7. The inferior vena cava is normal in size with greater than 50%  respiratory variability, suggesting right atrial pressure of 3 mmHg.   Comparison(s): 05/09/20 EF 30-35%.    EKG:  EKG is not ordered today.   Recent Labs: 04/27/2020: B Natriuretic Peptide 103.3 04/29/2020: Magnesium 2.6 05/15/2020: Hemoglobin 10.2; Platelets 792 07/14/2020: ALT 15; BUN 10; Creatinine, Ser 0.98; Potassium 4.5; Sodium 139  Recent Lipid Panel    Component Value Date/Time   CHOL 115 07/14/2020 1445   TRIG 81 07/14/2020 1445   HDL 54 07/14/2020 1445   CHOLHDL 2.1 07/14/2020 1445   CHOLHDL 4.9 04/27/2020 2052   VLDL 30 04/27/2020 2052    LDLCALC 45 07/14/2020 1445    Physical Exam:    VS:  BP 140/72   Pulse 85   Temp (!) 97.3 F (36.3 C)   Ht 5\' 9"  (1.753 m)   Wt 174 lb 9.6 oz (79.2 kg)   SpO2 98%   BMI 25.78 kg/m     Wt Readings from Last 3 Encounters:  11/02/20 174 lb 9.6 oz (79.2 kg)  07/14/20 184  lb (83.5 kg)  06/25/20 188 lb (85.3 kg)     GEN:  Well nourished, well developed in no acute distress HEENT: Normal NECK: No JVD; No carotid bruits LYMPHATICS: No lymphadenopathy CARDIAC: RRR, no murmurs, rubs, gallops. Chest incision has healed well.  RESPIRATORY:  Clear to auscultation without rales, wheezing or rhonchi  ABDOMEN: Soft, non-tender, non-distended MUSCULOSKELETAL:  No edema; No deformity  SKIN: Warm and dry NEUROLOGIC:  Alert and oriented x 3 PSYCHIATRIC:  Normal affect   ASSESSMENT:    1. Coronary artery disease involving coronary bypass graft of native heart without angina pectoris   2. S/P CABG x 4   3. Chronic combined systolic and diastolic CHF (congestive heart failure) (HCC)   4. Essential hypertension   5. Hyperlipidemia LDL goal <70    PLAN:    In order of problems listed above:  1. CAD s/p emergent CABG in setting of STEMI in May 2021- found to have severe 3 vessel occlusive disease: Continue aspirin and Plavix. Plan to stop Plavix after a year.  He is asymptomatic.   2. 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 Repeat EF in July improved but still impaired at 35-40%. I reviewed with him the rationale for medical therapy to reduce risk of developing clinical heart failure/hospitalization and death. Reviewed options including beta blocker, ARNI, ARB or ACEi, aldactone and SGLT 2 inhibitors. He reports intolerance to lisinopril in the past due to fatigue. He still doesn't want to take Entresto. Will start with losartan 25 mg daily. Would consider adding beta blocker in the future if tolerated.    3. Hypercholesterolemia: now on Crestor. Will repeat lipids next visit.     Medication Adjustments/Labs and Tests Ordered: Current medicines are reviewed at length with the patient today.  Concerns regarding medicines are outlined above.  Orders Placed This Encounter  Procedures  . Comprehensive Metabolic Panel (CMET)  . Lipid panel   Meds ordered this encounter  Medications  . losartan (COZAAR) 25 MG tablet    Sig: Take 1 tablet (25 mg total) by mouth daily.    Dispense:  90 tablet    Refill:  3    Patient Instructions  Start losartan 25 mg daily  We will see you in 3 months with lab work.      Signed, Jonnette Nuon [place], MD  11/02/2020 2:32 PM    [facility]

## 2020-11-02 ENCOUNTER — Encounter: Payer: Self-pay | Admitting: Cardiology

## 2020-11-02 ENCOUNTER — Other Ambulatory Visit: Payer: Self-pay

## 2020-11-02 ENCOUNTER — Ambulatory Visit (INDEPENDENT_AMBULATORY_CARE_PROVIDER_SITE_OTHER): Payer: Commercial Managed Care - PPO | Admitting: Cardiology

## 2020-11-02 VITALS — BP 140/72 | HR 85 | Temp 97.3°F | Ht 69.0 in | Wt 174.6 lb

## 2020-11-02 DIAGNOSIS — I5042 Chronic combined systolic (congestive) and diastolic (congestive) heart failure: Secondary | ICD-10-CM

## 2020-11-02 DIAGNOSIS — I10 Essential (primary) hypertension: Secondary | ICD-10-CM | POA: Diagnosis not present

## 2020-11-02 DIAGNOSIS — Z951 Presence of aortocoronary bypass graft: Secondary | ICD-10-CM

## 2020-11-02 DIAGNOSIS — I25810 Atherosclerosis of coronary artery bypass graft(s) without angina pectoris: Secondary | ICD-10-CM | POA: Diagnosis not present

## 2020-11-02 DIAGNOSIS — E785 Hyperlipidemia, unspecified: Secondary | ICD-10-CM

## 2020-11-02 MED ORDER — LOSARTAN POTASSIUM 25 MG PO TABS
25.0000 mg | ORAL_TABLET | Freq: Every day | ORAL | 3 refills | Status: DC
Start: 2020-11-02 — End: 2021-10-20

## 2020-11-02 NOTE — Patient Instructions (Signed)
Start losartan 25 mg daily  We will see you in 3 months with lab work.

## 2021-01-22 LAB — COMPREHENSIVE METABOLIC PANEL
ALT: 28 IU/L (ref 0–44)
AST: 37 IU/L (ref 0–40)
Albumin/Globulin Ratio: 1.5 (ref 1.2–2.2)
Albumin: 4.3 g/dL (ref 3.7–4.7)
Alkaline Phosphatase: 84 IU/L (ref 44–121)
BUN/Creatinine Ratio: 13 (ref 10–24)
BUN: 12 mg/dL (ref 8–27)
Bilirubin Total: 0.5 mg/dL (ref 0.0–1.2)
CO2: 23 mmol/L (ref 20–29)
Calcium: 9.1 mg/dL (ref 8.6–10.2)
Chloride: 99 mmol/L (ref 96–106)
Creatinine, Ser: 0.92 mg/dL (ref 0.76–1.27)
GFR calc Af Amer: 92 mL/min/{1.73_m2} (ref >59)
GFR calc non Af Amer: 79 mL/min/{1.73_m2} (ref >59)
Globulin, Total: 2.9 g/dL (ref 1.5–4.5)
Glucose: 78 mg/dL (ref 65–99)
Potassium: 4.4 mmol/L (ref 3.5–5.2)
Sodium: 138 mmol/L (ref 134–144)
Total Protein: 7.2 g/dL (ref 6.0–8.5)

## 2021-01-22 LAB — LIPID PANEL
Chol/HDL Ratio: 1.7 ratio (ref 0.0–5.0)
Cholesterol, Total: 118 mg/dL (ref 100–199)
HDL: 69 mg/dL (ref >39)
LDL Chol Calc (NIH): 37 mg/dL (ref 0–99)
Triglycerides: 51 mg/dL (ref 0–149)
VLDL Cholesterol Cal: 12 mg/dL (ref 5–40)

## 2021-01-25 NOTE — Progress Notes (Signed)
Cardiology Office Note:    Date:  01/27/2021   ID:  Joshua Mack, DOB 1941-09-30, MRN [id number]  PCP:  Johny Blamer, MD  [facility] Cardiologist:  Dawnell Bryant [place], MD  [facility] Electrophysiologist:  None   Referring MD: Johny Blamer, MD   Chief Complaint  Patient presents with  . Coronary Artery Disease    History of Present Illness:    Joshua Mack is a 80 y.o. male with a hx of HTN, HLD, spinal stenosis, prior DVT and CAD.  Patient presented to the hospital on 04/27/2020 for chest pain and shortness of breath and found to have STEMI. Urgent cardiac catheterization performed on 04/24/2020 showed 100% mid LAD occlusion after a large D1 with faint right to left collateral, 90% ramus intermediate, 100% CTO of mid left circumflex with good left to left collaterals, 100% CTO of proximal RCA with right to right and left to right collaterals, LV gram was unable to assess LV function due to underfilling.  There was also moderately elevated LV filling pressure with preserved cardiac output of 4.4 L/min.  Patient was placed on IVBP support.  Echocardiogram obtained on the following day showed EF 30 to 35%, grade 2 DD.  Patient eventually underwent urgent CABG x4 with LIMA to LAD, SVG to PDA, SVG to D1 and SVG to OM 3 by Dr. Cliffton Asters.  Intra-aortic balloon pump was removed during the procedure.   Since discharge, patient returned to the ED on 5/17 after a brief unresponsive episode.  He returned to the hospital again on 5/22 due to sudden expression of fluid from his sternal incision.  He was seen by Dr. Vickey Sages who placed the chest tube and they removed 1300 mL of serosanguineous fluid.  Chest tube was later removed on 5/25.  Repeat limited echocardiogram obtained on 05/09/2020 showed EF 30 to 35%, trivial mitral regurgitation, mild AI.  He was seen by Azalee Course PA-C for follow-up on 05/07/2020, he was hypotensive at the time was blood pressure in the 80s.  He was hydrated and all CHF meds were  held including lasix, metoprolol, and lisinopril. Follow up Echo in July showed EF 35-40%.   On follow up today he reports he feels the best he has in 10 years. His weight is stable. No edema. no dyspnea or chest pain. Still working  as a Banker. He is taking losartan and is tolerating this well. He states he never started on Entresto because he read an article that suggested a link to Alzheimer's.    Past Medical History:  Diagnosis Date  . CHF (congestive heart failure) (HCC)   . Complication of anesthesia    " I had a reaction to versed "  . Coronary artery disease   . High cholesterol   . Hypertension   . Sleep apnea     Past Surgical History:  Procedure Laterality Date  . CARDIAC CATHETERIZATION    . CARDIAC SURGERY    . CHEST TUBE INSERTION Left 05/09/2020  . CORONARY ARTERY BYPASS GRAFT N/A 04/28/2020   Procedure: CORONARY ARTERY BYPASS GRAFTING (CABG) times four using LIMA to LAD; bilateral endoscopic greater saphenous vein harvest: SVG to Circ; SVG to RAMUS; and SVG to PDA.;  Surgeon: Corliss Skains, MD;  Location: [facility] OR;  Service: Open Heart Surgery;  Laterality: N/A;  . ENDOVEIN HARVEST OF GREATER SAPHENOUS VEIN Bilateral 04/28/2020   Procedure: [name];  Surgeon: Corliss Skains, MD;  Location: [facility] OR;  Service: Open Heart Surgery;  Laterality: Bilateral;  . IABP INSERTION N/A 04/27/2020   Procedure: IABP Insertion;  Surgeon: [place], Ariyel Jeangilles M, MD;  Location: [facility] INVASIVE CV LAB;  Service: Cardiovascular;  Laterality: N/A;  . LUMBAR LAMINECTOMY/DECOMPRESSION MICRODISCECTOMY N/A 05/03/2017   Procedure: Microlumbar decompression L4-5, L3-4,  L5-S1 WITH LATERAL AUTOGRAFT FUSION;  Surgeon: Jene Every, MD;  Location: [facility] ORS;  Service: Orthopedics;  Laterality: N/A;  . RIGHT/LEFT HEART CATH AND CORONARY ANGIOGRAPHY N/A 04/27/2020   Procedure: RIGHT/LEFT HEART CATH AND CORONARY ANGIOGRAPHY;  Surgeon: [place], Jyair Kiraly M, MD;   Location: [facility] INVASIVE CV LAB;  Service: Cardiovascular;  Laterality: N/A;    Current Medications: Current Meds  Medication Sig  . acetaminophen (TYLENOL) 500 MG tablet Take 500 mg by mouth daily as needed for mild pain or headache.   Marland Kitchen aspirin EC 81 MG EC tablet Take 1 tablet (81 mg total) by mouth daily.  . Cholecalciferol (VITAMIN D-3) 25 MCG (1000 UT) CAPS   . losartan (COZAAR) 25 MG tablet Take 1 tablet (25 mg total) by mouth daily.  . Misc Natural Products (OSTEO BI-FLEX/5-LOXIN ADVANCED PO) Take 500 mg by mouth.  . Multiple Vitamins-Minerals (CENTRUM SILVER 50+MEN) TABS Take 1 tablet by mouth daily with breakfast.  . PRESCRIPTION MEDICATION CPAP- At bedtime and during times of rest  . Probiotic Product (PROBIOTIC PO) Take 1 capsule by mouth daily as needed (Constipation).  . rosuvastatin (CRESTOR) 20 MG tablet Take 1 tablet (20 mg total) by mouth daily.  . shark liver oil-cocoa butter (PREPARATION H) 0.25-88.44 % suppository Place 1 suppository rectally as needed for hemorrhoids.  . [DISCONTINUED] clopidogrel (PLAVIX) 75 MG tablet Take 1 tablet (75 mg total) by mouth daily.     Allergies:   Clemastine, Fentanyl, Midazolam, Amoxicillin, Other, and Tavist nd [loratadine]   Social History   Socioeconomic History  . Marital status: Married    Spouse name: Not on file  . Number of children: Not on file  . Years of education: 58  . Highest education level: Not on file  Occupational History  . Not on file  Tobacco Use  . Smoking status: Never Smoker  . Smokeless tobacco: Never Used  Vaping Use  . Vaping Use: Never used  Substance and Sexual Activity  . Alcohol use: No  . Drug use: No  . Sexual activity: Not on file  Other Topics Concern  . Not on file  Social History Narrative  . Not on file   Social Determinants of Health   Financial Resource Strain: Not on file  Food Insecurity: Not on file  Transportation Needs: Not on file  Physical Activity: Not on file  Stress:  Not on file  Social Connections: Not on file     Family History: The patient's family history is not on file.  ROS:   Please see the history of present illness.     All other systems reviewed and are negative.  EKGs/Labs/Other Studies Reviewed:    The following studies were reviewed today: Cath 04/27/2020  Prox RCA lesion is 100% stenosed.  Prox Cx to Mid Cx lesion is 100% stenosed.  Ramus lesion is 90% stenosed.  Mid LAD lesion is 100% stenosed.  LV end diastolic pressure is moderately elevated.  1. 3 vessel occlusive CAD. - 100% mid LAD after a large diagonal branch. Faint right to left collaterals. - 90% ramus intermediate - 100% CTO of the mid LCx with good left to left collaterals - 100% CTO of the proximal  RCA. Good right to right and left to right collaterals. 2. Difficult to assess LV function due to underfilling. Will assess with Echo 3. Moderately elevated LV filling pressures. Improved with IV lasix and IABP 4. Normal right heart pressures 5. Preserved cardiac output 4.4 liters/min  Plan: transfer to ICU. Heparinize. IABP support. CT surgery consultation for CABG. Assess LV function with Echo. Continue diuresis. Trend troponin levels.    Echo 04/28/2020 IMPRESSIONS    1. No intracavitary thrombus is seen. The dyskinetic anteroapical area is  not thinned or hyperechoic, suggesting recent ischemia/infarction. Left  ventricular ejection fraction, by estimation, is 30 to 35%. The left  ventricle has moderate to severely  decreased function. The left ventricle demonstrates regional wall motion  abnormalities (see scoring diagram/findings for description). Left  ventricular diastolic parameters are consistent with Grade II diastolic  dysfunction (pseudonormalization).  Elevated left atrial pressure.  2. Right ventricular systolic function is normal. The right ventricular  size is normal. A catheter or pacemaker wire is seen in the right   ventricle. is visualized.  3. The mitral valve is normal in structure. No evidence of mitral valve  regurgitation.  4. The aortic valve is normal in structure. Aortic valve regurgitation is  not visualized.   Echo 07/16/20: IMPRESSIONS    1. Septal and apical akinesis mid and basal inferior wall hypokinesis EF  appears slightly improved since CABG . Left ventricular ejection fraction,  by estimation, is 35 to 40%. The left ventricle has moderately decreased  function. The left ventricle  demonstrates regional wall motion abnormalities (see scoring  diagram/findings for description). The left ventricular internal cavity  size was moderately dilated. Left ventricular diastolic parameters were  normal.  2. Right ventricular systolic function is normal. The right ventricular  size is normal.  3. Left atrial size was mild to moderately dilated.  4. The mitral valve is normal in structure. No evidence of mitral valve  regurgitation. No evidence of mitral stenosis.  5. The aortic valve was not well visualized. Aortic valve regurgitation  is not visualized. Mild to moderate aortic valve sclerosis/calcification  is present, without any evidence of aortic stenosis.  6. Aortic dilatation noted. 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  7. The inferior vena cava is normal in size with greater than 50%  respiratory variability, suggesting right atrial pressure of 3 mmHg.   Comparison(s): 05/09/20 EF 30-35%.    EKG:  EKG is not ordered today.   Recent Labs: 04/27/2020: B Natriuretic Peptide 103.3 04/29/2020: Magnesium 2.6 05/15/2020: Hemoglobin 10.2; Platelets 792 01/21/2021: ALT 28; BUN 12; Creatinine, Ser 0.92; Potassium 4.4; Sodium 138  Recent Lipid Panel    Component Value Date/Time   CHOL 118 01/21/2021 1038   TRIG 51 01/21/2021 1038   HDL 69 01/21/2021 1038   CHOLHDL 1.7 01/21/2021 1038   CHOLHDL 4.9 04/27/2020 2052   VLDL 30 04/27/2020 2052    LDLCALC 37 01/21/2021 1038    Physical Exam:    VS:  BP 126/70   Pulse 84   Ht 5\' 10"  (1.778 m)   Wt 174 lb 12.8 oz (79.3 kg)   BMI 25.08 kg/m     Wt Readings from Last 3 Encounters:  01/27/21 174 lb 12.8 oz (79.3 kg)  11/02/20 174 lb 9.6 oz (79.2 kg)  07/14/20 184 lb (83.5 kg)     GEN:  Well nourished, well developed in no acute distress HEENT: Normal NECK: No JVD; No carotid bruits LYMPHATICS:  No lymphadenopathy CARDIAC: RRR, no murmurs, rubs, gallops. Chest incision has healed well.  RESPIRATORY:  Clear to auscultation without rales, wheezing or rhonchi  ABDOMEN: Soft, non-tender, non-distended MUSCULOSKELETAL:  No edema; No deformity  SKIN: Warm and dry NEUROLOGIC:  Alert and oriented x 3 PSYCHIATRIC:  Normal affect   ASSESSMENT:    1. Coronary artery disease involving coronary bypass graft of native heart without angina pectoris   2. S/P CABG x 4   3. Chronic combined systolic and diastolic CHF (congestive heart failure) (HCC)   4. Essential hypertension   5. Hypercholesteremia    PLAN:    In order of problems listed above:  1. CAD s/p emergent CABG in setting of STEMI in May 2021- found to have severe 3 vessel occlusive disease: Continue aspirin. He report a lot of bleeding on Plavix so we will stop it.  He is asymptomatic.   2. 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 Previously medication post op stopped due to hypotension. Repeat EF in July improved but still impaired at 35-40%. I reviewed with him the rationale for medical therapy to reduce risk of developing clinical heart failure/hospitalization and death. He is tolerating losartan 25 mg daily. Will add Toprol XL 25 mg daily.   He reports intolerance to lisinopril in the past due to fatigue. He still doesn't want to take Entresto. Will repeat Echo in 3 months.  3. Hypercholesterolemia: now on Crestor. Excellent results. LDL 37.     Medication Adjustments/Labs and Tests Ordered: Current  medicines are reviewed at length with the patient today.  Concerns regarding medicines are outlined above.  No orders of the defined types were placed in this encounter.  No orders of the defined types were placed in this encounter.   Patient Instructions  Stop taking Plavix  We will add Toprol XL 25 mg daily  Continue your other therapy  We will repeat an Echo in 3 months.      Signed, Rogan Ecklund [place], MD  01/27/2021 2:41 PM    [facility]

## 2021-01-27 ENCOUNTER — Other Ambulatory Visit: Payer: Self-pay

## 2021-01-27 ENCOUNTER — Ambulatory Visit (INDEPENDENT_AMBULATORY_CARE_PROVIDER_SITE_OTHER): Payer: Commercial Managed Care - PPO | Admitting: Cardiology

## 2021-01-27 ENCOUNTER — Encounter: Payer: Self-pay | Admitting: Cardiology

## 2021-01-27 VITALS — BP 126/70 | HR 84 | Ht 70.0 in | Wt 174.8 lb

## 2021-01-27 DIAGNOSIS — I25810 Atherosclerosis of coronary artery bypass graft(s) without angina pectoris: Secondary | ICD-10-CM | POA: Diagnosis not present

## 2021-01-27 DIAGNOSIS — I10 Essential (primary) hypertension: Secondary | ICD-10-CM

## 2021-01-27 DIAGNOSIS — I5042 Chronic combined systolic (congestive) and diastolic (congestive) heart failure: Secondary | ICD-10-CM | POA: Diagnosis not present

## 2021-01-27 DIAGNOSIS — Z951 Presence of aortocoronary bypass graft: Secondary | ICD-10-CM

## 2021-01-27 DIAGNOSIS — E7800 Pure hypercholesterolemia, unspecified: Secondary | ICD-10-CM

## 2021-01-27 MED ORDER — METOPROLOL SUCCINATE ER 25 MG PO TB24
25.0000 mg | ORAL_TABLET | Freq: Every day | ORAL | 3 refills | Status: DC
Start: 2021-01-27 — End: 2021-05-05

## 2021-01-27 NOTE — Addendum Note (Signed)
Addended by: Neoma Laming on: 01/27/2021 02:55 PM   Modules accepted: Orders

## 2021-01-27 NOTE — Patient Instructions (Signed)
Stop taking Plavix  We will add Toprol XL 25 mg daily  Continue your other therapy  We will repeat an Echo in 3 months.

## 2021-04-26 ENCOUNTER — Other Ambulatory Visit: Payer: Self-pay

## 2021-04-26 ENCOUNTER — Ambulatory Visit (HOSPITAL_COMMUNITY): Payer: Commercial Managed Care - PPO | Attending: Cardiology

## 2021-04-26 DIAGNOSIS — E7800 Pure hypercholesterolemia, unspecified: Secondary | ICD-10-CM | POA: Diagnosis present

## 2021-04-26 DIAGNOSIS — I25810 Atherosclerosis of coronary artery bypass graft(s) without angina pectoris: Secondary | ICD-10-CM | POA: Diagnosis not present

## 2021-04-26 DIAGNOSIS — Z951 Presence of aortocoronary bypass graft: Secondary | ICD-10-CM | POA: Insufficient documentation

## 2021-04-26 DIAGNOSIS — I5042 Chronic combined systolic (congestive) and diastolic (congestive) heart failure: Secondary | ICD-10-CM | POA: Insufficient documentation

## 2021-04-26 DIAGNOSIS — I10 Essential (primary) hypertension: Secondary | ICD-10-CM | POA: Insufficient documentation

## 2021-04-26 LAB — ECHOCARDIOGRAM COMPLETE
Area-P 1/2: 4.6 cm2
P 1/2 time: 314 msec
S' Lateral: 3.6 cm

## 2021-04-26 MED ORDER — PERFLUTREN LIPID MICROSPHERE
1.0000 mL | INTRAVENOUS | Status: AC | PRN
  Administered 2021-04-26: 1 mL via INTRAVENOUS

## 2021-05-05 ENCOUNTER — Telehealth: Payer: Self-pay

## 2021-05-05 NOTE — Telephone Encounter (Signed)
Spoke to patient's wife echo results given.Stated she wanted Dr.Jordan to know patient never started Metoprolol due to low B/P ranging 110/64,102/67.Dr.Jordan advised ok not to take.

## 2021-05-21 ENCOUNTER — Other Ambulatory Visit: Payer: Self-pay | Admitting: Cardiology

## 2021-05-24 NOTE — Progress Notes (Signed)
Cardiology Office Note:    Date:  06/07/2021   ID:  ROXIE GUEYE, DOB 05/01/1941, MRN [id number]  PCP:  Johny Blamer, MD  [facility] Cardiologist:  Beverley Sherrard [place], MD  [facility] Electrophysiologist:  None   Referring MD: Johny Blamer, MD   Chief Complaint  Patient presents with   Coronary Artery Disease    History of Present Illness:    Joshua Mack is a 80 y.o. male with a hx of HTN, HLD, spinal stenosis, prior DVT and CAD.  Patient presented to the hospital on 04/27/2020 for chest pain and shortness of breath and found to have STEMI. Urgent cardiac catheterization performed on 04/24/2020 showed 100% mid LAD occlusion after a large D1 with faint right to left collateral, 90% ramus intermediate, 100% CTO of mid left circumflex with good left to left collaterals, 100% CTO of proximal RCA with right to right and left to right collaterals, LV gram was unable to assess LV function due to underfilling.  There was also moderately elevated LV filling pressure with preserved cardiac output of 4.4 L/min.  Patient was placed on IVBP support.  Echocardiogram obtained on the following day showed EF 30 to 35%, grade 2 DD.  Patient eventually underwent urgent CABG x4 with LIMA to LAD, SVG to PDA, SVG to D1 and SVG to OM 3 by Dr. Cliffton Asters.  Intra-aortic balloon pump was removed during the procedure.   Since discharge, patient returned to the ED on 5/17 after a brief unresponsive episode.  He returned to the hospital again on 5/22 due to sudden expression of fluid from his sternal incision.  He was seen by Dr. Vickey Sages who placed the chest tube and they removed 1300 mL of serosanguineous fluid.  Chest tube was later removed on 5/25.  Repeat limited echocardiogram obtained on 05/09/2020 showed EF 30 to 35%, trivial mitral regurgitation, mild AI.  He was seen by Azalee Course PA-C for follow-up on 05/07/2020, he was hypotensive at the time was blood pressure in the 80s.  He was hydrated and all CHF meds were  held including lasix, metoprolol, and lisinopril. Follow up Echo in July showed EF 35-40%. He was later started on losartan and Toprol. Repeat Echo in May showed improvement in EF to 40-45%. Patient reports he never really started Toprol due to hypotension and low pulse.   On follow up today he reports he feels great. His weight is stable. No edema. No dyspnea or chest pain. Still working  as a Banker. He is exercising regularly. Reports BP at work is 113/69 with pulse of 63. He is taking losartan and is tolerating this well. He states he never started on Entresto because he read an article that suggested a link to Alzheimer's. He is going to need cataract surgery. Wants to use Viagra.   Past Medical History:  Diagnosis Date   CHF (congestive heart failure) (HCC)    Complication of anesthesia    " I had a reaction to versed "   Coronary artery disease    High cholesterol    Hypertension    Sleep apnea     Past Surgical History:  Procedure Laterality Date   CARDIAC CATHETERIZATION     CARDIAC SURGERY     CHEST TUBE INSERTION Left 05/09/2020   CORONARY ARTERY BYPASS GRAFT N/A 04/28/2020   Procedure: CORONARY ARTERY BYPASS GRAFTING (CABG) times four using LIMA to LAD; bilateral endoscopic greater saphenous vein harvest: SVG to Circ; SVG to RAMUS; and SVG  to PDA.;  Surgeon: Corliss Skains, MD;  Location: [facility] OR;  Service: Open Heart Surgery;  Laterality: N/A;   ENDOVEIN HARVEST OF GREATER SAPHENOUS VEIN Bilateral 04/28/2020   Procedure: [name];  Surgeon: Corliss Skains, MD;  Location: [facility] OR;  Service: Open Heart Surgery;  Laterality: Bilateral;   IABP INSERTION N/A 04/27/2020   Procedure: IABP Insertion;  Surgeon: [place], Maevyn Riordan M, MD;  Location: [facility] INVASIVE CV LAB;  Service: Cardiovascular;  Laterality: N/A;   LUMBAR LAMINECTOMY/DECOMPRESSION MICRODISCECTOMY N/A 05/03/2017   Procedure: Microlumbar decompression L4-5, L3-4,  L5-S1 WITH  LATERAL AUTOGRAFT FUSION;  Surgeon: Jene Every, MD;  Location: [facility] ORS;  Service: Orthopedics;  Laterality: N/A;   RIGHT/LEFT HEART CATH AND CORONARY ANGIOGRAPHY N/A 04/27/2020   Procedure: RIGHT/LEFT HEART CATH AND CORONARY ANGIOGRAPHY;  Surgeon: [place], Loukisha Gunnerson M, MD;  Location: [facility] INVASIVE CV LAB;  Service: Cardiovascular;  Laterality: N/A;    Current Medications: Current Meds  Medication Sig   acetaminophen (TYLENOL) 500 MG tablet Take 500 mg by mouth daily as needed for mild pain or headache.    aspirin EC 81 MG EC tablet Take 1 tablet (81 mg total) by mouth daily.   Cholecalciferol (VITAMIN D-3) 25 MCG (1000 UT) CAPS    losartan (COZAAR) 25 MG tablet Take 1 tablet (25 mg total) by mouth daily.   Misc Natural Products (OSTEO BI-FLEX/5-LOXIN ADVANCED PO) Take 500 mg by mouth.   Multiple Vitamins-Minerals (CENTRUM SILVER 50+MEN) TABS Take 1 tablet by mouth daily with breakfast.   PRESCRIPTION MEDICATION CPAP- At bedtime and during times of rest   Probiotic Product (PROBIOTIC PO) Take 1 capsule by mouth daily as needed (Constipation).   rosuvastatin (CRESTOR) 20 MG tablet Take 1 tablet (20 mg total) by mouth daily.   shark liver oil-cocoa butter (PREPARATION H) 0.25-88.44 % suppository Place 1 suppository rectally as needed for hemorrhoids.   sildenafil (VIAGRA) 50 MG tablet Take 1 tablet (50 mg total) by mouth daily as needed for erectile dysfunction.     Allergies:   Clemastine, Fentanyl, Midazolam, Amoxicillin, Other, and Tavist nd [loratadine]   Social History   Socioeconomic History   Marital status: Married    Spouse name: Not on file   Number of children: Not on file   Years of education: 16   Highest education level: Not on file  Occupational History   Not on file  Tobacco Use   Smoking status: Never   Smokeless tobacco: Never  Vaping Use   Vaping Use: Never used  Substance and Sexual Activity   Alcohol use: No   Drug use: No   Sexual activity: Not on file  Other  Topics Concern   Not on file  Social History Narrative   Not on file   Social Determinants of Health   Financial Resource Strain: Not on file  Food Insecurity: Not on file  Transportation Needs: Not on file  Physical Activity: Not on file  Stress: Not on file  Social Connections: Not on file     Family History: The patient's family history is not on file.  ROS:   Please see the history of present illness.     All other systems reviewed and are negative.  EKGs/Labs/Other Studies Reviewed:    The following studies were reviewed today: Cath 04/27/2020 Prox RCA lesion is 100% stenosed. Prox Cx to Mid Cx lesion is 100% stenosed. Ramus lesion is 90% stenosed. Mid LAD lesion is 100% stenosed. LV end diastolic  pressure is moderately elevated.   1. 3 vessel occlusive CAD.    - 100% mid LAD after a large diagonal branch. Faint right to left collaterals.    - 90% ramus intermediate    - 100% CTO of the mid LCx with good left to left collaterals    - 100% CTO of the proximal RCA. Good right to right and left to right collaterals. 2. Difficult to assess LV function due to underfilling. Will assess with Echo 3. Moderately elevated LV filling pressures. Improved with IV lasix and IABP 4. Normal right heart pressures 5. Preserved cardiac output 4.4 liters/min   Plan: transfer to ICU. Heparinize. IABP support. CT surgery consultation for CABG. Assess LV function with Echo. Continue diuresis. Trend troponin levels.      Echo 04/28/2020 IMPRESSIONS     1. No intracavitary thrombus is seen. The dyskinetic anteroapical area is  not thinned or hyperechoic, suggesting recent ischemia/infarction. Left  ventricular ejection fraction, by estimation, is 30 to 35%. The left  ventricle has moderate to severely  decreased function. The left ventricle demonstrates regional wall motion  abnormalities (see scoring diagram/findings for description). Left  ventricular diastolic parameters are  consistent with Grade II diastolic  dysfunction (pseudonormalization).  Elevated left atrial pressure.   2. Right ventricular systolic function is normal. The right ventricular  size is normal. A catheter or pacemaker wire is seen in the right  ventricle. is visualized.   3. The mitral valve is normal in structure. No evidence of mitral valve  regurgitation.   4. The aortic valve is normal in structure. Aortic valve regurgitation is  not visualized.    Echo 07/16/20: IMPRESSIONS     1. Septal and apical akinesis mid and basal inferior wall hypokinesis EF  appears slightly improved since CABG . Left ventricular ejection fraction,  by estimation, is 35 to 40%. The left ventricle has moderately decreased  function. The left ventricle  demonstrates regional wall motion abnormalities (see scoring  diagram/findings for description). The left ventricular internal cavity  size was moderately dilated. Left ventricular diastolic parameters were  normal.   2. Right ventricular systolic function is normal. The right ventricular  size is normal.   3. Left atrial size was mild to moderately dilated.   4. The mitral valve is normal in structure. No evidence of mitral valve  regurgitation. No evidence of mitral stenosis.   5. The aortic valve was not well visualized. Aortic valve regurgitation  is not visualized. Mild to moderate aortic valve sclerosis/calcification  is present, without any evidence of aortic stenosis.   6. Aortic dilatation noted. 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   7. The inferior vena cava is normal in size with greater than 50%  respiratory variability, suggesting right atrial pressure of 3 mmHg.   Comparison(s): 05/09/20 EF 30-35%.   Echo 04/26/21: IMPRESSIONS     1. Left ventricular ejection fraction, by estimation, is 40 to 45%. The  left ventricle has mildly decreased function. The left ventricle has no  regional wall motion abnormalities.  Left ventricular diastolic parameters  are consistent with Grade I  diastolic dysfunction (impaired relaxation).   2. Right ventricular systolic function is normal. The right ventricular  size is normal. There is normal pulmonary artery systolic pressure.   3. The mitral valve is normal in structure. Mild mitral valve  regurgitation. No evidence of mitral stenosis.   4. The aortic valve is calcified. There is mild calcification  of the  aortic valve. There is mild thickening of the aortic valve. Aortic valve  regurgitation is mild. Mild to moderate aortic valve  sclerosis/calcification is present, without any evidence  of aortic stenosis.   5. The inferior vena cava is normal in size with greater than 50%  respiratory variability, suggesting right atrial pressure of 3 mmHg.   Comparison(s): The left ventricular function has improved.    EKG:  EKG is ordered today. NSR rate 71. LAD. 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 Compared with 05/21/20 there is no longer T wave inversion in the precordial leads. I have personally reviewed and interpreted this study.   Recent Labs: 01/21/2021: ALT 28; BUN 12; Creatinine, Ser 0.92; Potassium 4.4; Sodium 138  Recent Lipid Panel    Component Value Date/Time   CHOL 118 01/21/2021 1038   TRIG 51 01/21/2021 1038   HDL 69 01/21/2021 1038   CHOLHDL 1.7 01/21/2021 1038   CHOLHDL 4.9 04/27/2020 2052   VLDL 30 04/27/2020 2052   LDLCALC 37 01/21/2021 1038    Physical Exam:    VS:  BP 130/74 (BP Location: Right Arm, Patient Position: Sitting, Cuff Size: Normal)   Pulse 71   Ht 5' 9.5" (1.765 m)   Wt 176 lb 3.2 oz (79.9 kg)   BMI 25.65 kg/m     Wt Readings from Last 3 Encounters:  06/07/21 176 lb 3.2 oz (79.9 kg)  01/27/21 174 lb 12.8 oz (79.3 kg)  11/02/20 174 lb 9.6 oz (79.2 kg)     GEN:  Well nourished, well developed in no acute distress HEENT: Normal NECK: No JVD; No carotid bruits LYMPHATICS: No lymphadenopathy CARDIAC: RRR, no  murmurs, rubs, gallops. Chest incision has healed well.  RESPIRATORY:  Clear to auscultation without rales, wheezing or rhonchi  ABDOMEN: Soft, non-tender, non-distended MUSCULOSKELETAL:  No edema; No deformity  SKIN: Warm and dry NEUROLOGIC:  Alert and oriented x 3 PSYCHIATRIC:  Normal affect   ASSESSMENT:    1. Coronary artery disease involving coronary bypass graft of native heart without angina pectoris   2. S/P CABG x 4   3. Hypercholesteremia   4. Chronic combined systolic and diastolic CHF (congestive heart failure) (HCC)   5. Essential hypertension    PLAN:    In order of problems listed above:  CAD s/p emergent CABG in setting of STEMI in May 2021- found to have severe 3 vessel occlusive disease: Continue aspirin.  He is asymptomatic. Not on beta blocker due to hypotension.   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   He is tolerating losartan 25 mg daily but did not tolerate Toprol XL.   He reports intolerance to lisinopril in the past due to fatigue. He still doesn't want to take Entresto. Repeat Echo showed some improvement with EF 40-45%  Hypercholesterolemia: now on Crestor. Excellent results. LDL 37.  Cataracts. He is clear for surgery from my standpoint.   Follow up in one year.    Medication Adjustments/Labs and Tests Ordered: Current medicines are reviewed at length with the patient today.  Concerns regarding medicines are outlined above.  Orders Placed This Encounter  Procedures   EKG 12-Lead   Meds ordered this encounter  Medications   sildenafil (VIAGRA) 50 MG tablet    Sig: Take 1 tablet (50 mg total) by mouth daily as needed for erectile dysfunction.    Dispense:  10 tablet    Refill:  5    There are no Patient Instructions on file for this  visit.   Signed, Keian Odriscoll [place], MD  06/07/2021 8:26 AM    [facility]

## 2021-06-07 ENCOUNTER — Ambulatory Visit (INDEPENDENT_AMBULATORY_CARE_PROVIDER_SITE_OTHER): Payer: Commercial Managed Care - PPO | Admitting: Cardiology

## 2021-06-07 ENCOUNTER — Encounter: Payer: Self-pay | Admitting: Cardiology

## 2021-06-07 ENCOUNTER — Other Ambulatory Visit: Payer: Self-pay

## 2021-06-07 VITALS — BP 130/74 | HR 71 | Ht 69.5 in | Wt 176.2 lb

## 2021-06-07 DIAGNOSIS — I25810 Atherosclerosis of coronary artery bypass graft(s) without angina pectoris: Secondary | ICD-10-CM | POA: Diagnosis not present

## 2021-06-07 DIAGNOSIS — I5042 Chronic combined systolic (congestive) and diastolic (congestive) heart failure: Secondary | ICD-10-CM

## 2021-06-07 DIAGNOSIS — Z951 Presence of aortocoronary bypass graft: Secondary | ICD-10-CM | POA: Diagnosis not present

## 2021-06-07 DIAGNOSIS — I10 Essential (primary) hypertension: Secondary | ICD-10-CM

## 2021-06-07 DIAGNOSIS — E7800 Pure hypercholesterolemia, unspecified: Secondary | ICD-10-CM | POA: Diagnosis not present

## 2021-06-07 MED ORDER — SILDENAFIL CITRATE 50 MG PO TABS
50.0000 mg | ORAL_TABLET | Freq: Every day | ORAL | 5 refills | Status: AC | PRN
Start: 2021-06-07 — End: ?

## 2021-06-24 ENCOUNTER — Telehealth: Payer: Self-pay | Admitting: Cardiology

## 2021-06-24 NOTE — Telephone Encounter (Signed)
Pts wife calling to discuss any activity limitations pt may have since having his CABG last year. She states he still works full time and "doesn't stop" all day long. She is concerned because he is "sneaking treats" and she has not been able to control his diet these last few months. She states he has had no c/o CP, SOB, fatigue, swelling, weight gain and that he tells her he feels great. She would like clarification if there is an actual work and activity restriction. She would like for him to slow down.  Pts wife and I discussed pt being off activitiy restriction some time after his surgery. If he feels well, has no SOB, CP with activity typically we recommend activity to tolerate. We discussed QOL and how if can correlate  with mental health. If activity and work is where he finds his good QOL, it would be difficult to take that from him. He likely needs to find balance. If he begins to have cardiovascular symptoms again, he should not ignore and see his cardiologist.   I will forward to Dr. [place] to review and give additional recommendation.

## 2021-06-24 NOTE — Telephone Encounter (Signed)
Patient's wife called with question concerning her husband job and what he can and cant do. (Weight lifting, pulling etc.  Since he has a procedure done. Please advise

## 2021-06-25 NOTE — Telephone Encounter (Signed)
Agree with recommendations. No restrictions for work. Would encourage healthy diet.  Amadi Yoshino [place] MD, [facility]

## 2021-06-29 NOTE — Telephone Encounter (Signed)
Spoke to patient Dr.Jordan's advice given. 

## 2021-07-09 ENCOUNTER — Telehealth: Payer: Self-pay | Admitting: Cardiology

## 2021-07-09 NOTE — Telephone Encounter (Signed)
Patient's wife is following up. She requested that the call be returned to the patient at [phone].

## 2021-07-09 NOTE — Telephone Encounter (Signed)
Spoke with pt, he has tested positive for covid and the symptoms started last night. He has a fever of 102.9 and is a little congested in his head. He is eating and drinking as much as he can. He reports his bp is normal but his pulse is running 90 to 100 bpm and is regular. He reports his oxygen saturation is 93%. Patient encouraged to continue with tylenol and to alternate with ibuprofen to help with his fever to help with his heart rate. He will call his medical doctor to see if he can get a prescription, otherwise he will need to treat his symptoms. Patient voiced understanding to go to the ER if his symptoms worsen. Will forward to dr [place] to review.

## 2021-07-09 NOTE — Telephone Encounter (Signed)
Agree. Nothing special from a cardiac standpoint but needs to touch base with primary care about treating his Covid  Aqib Lough [place] MD, [facility]

## 2021-07-09 NOTE — Telephone Encounter (Signed)
Patient's wife to say that she test positive and so did her son. And she believes he do to because he has temp 104. Wife is calling to see what they would need to do being that he just had a bipas last year. Please advise

## 2021-07-23 ENCOUNTER — Other Ambulatory Visit: Payer: Self-pay | Admitting: Cardiology

## 2021-10-20 ENCOUNTER — Other Ambulatory Visit: Payer: Self-pay | Admitting: Cardiology

## 2021-10-21 ENCOUNTER — Other Ambulatory Visit: Payer: Self-pay | Admitting: Cardiology

## 2022-01-19 ENCOUNTER — Other Ambulatory Visit: Payer: Self-pay | Admitting: Cardiology

## 2022-03-26 IMAGING — DX DG CHEST 1V PORT
1 series · 1 of 1 positions shown · non-contrast
Comparison: 04/27/2020

CLINICAL DATA: Post CABG

EXAM:
PORTABLE CHEST 1 VIEW

[chest]
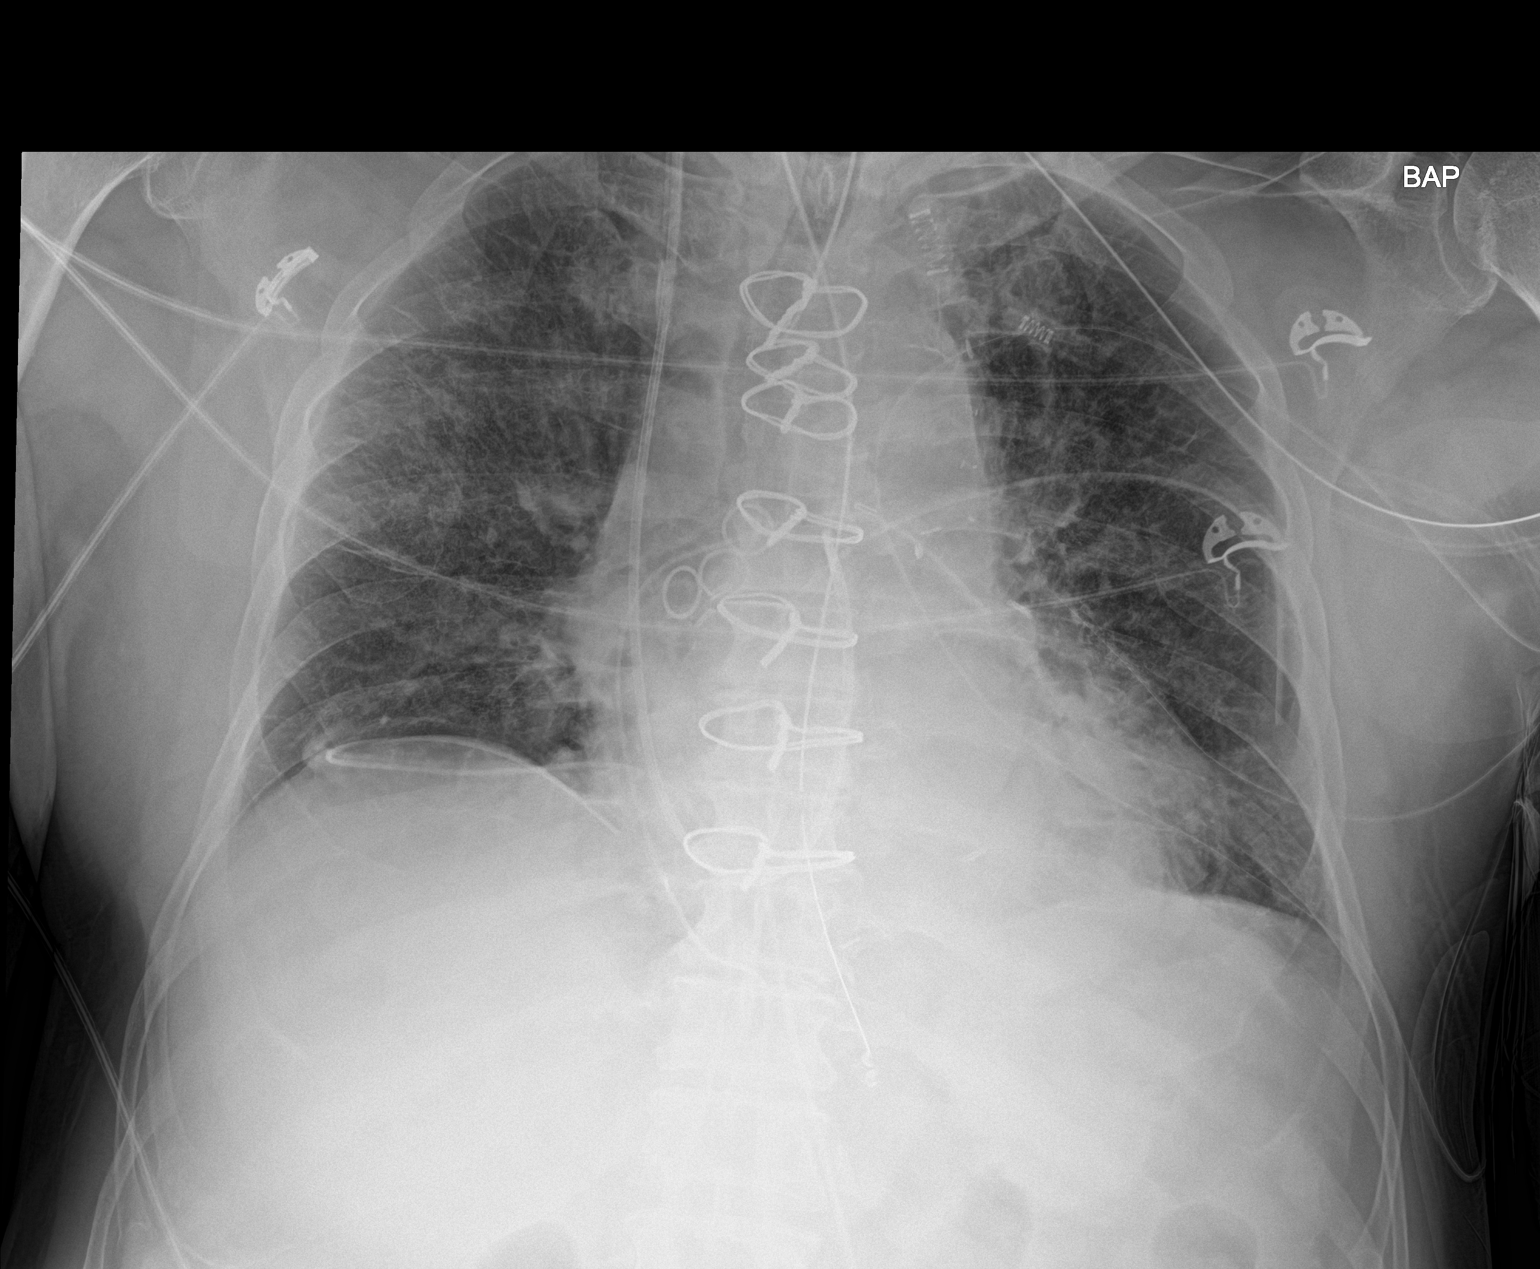

[1 of 1 positions shown; findings below may reference images not displayed]

FINDINGS: Changes of CABG. NG tube tip is in the distal esophagus near the GE
junction. Endotracheal tube is 5 cm above the carina. Bilateral
chest tubes. No pneumothorax. Swan-Ganz catheter tip is in the main
pulmonary artery. Bibasilar atelectasis.
IMPRESSION: Postoperative changes.  Bibasilar atelectasis.  No pneumothorax.

## 2022-04-09 IMAGING — DX DG CHEST 1V PORT
1 series · 1 of 1 positions shown · non-contrast
Comparison: May 11, 2020.

CLINICAL DATA: Left pleural effusion.

EXAM:
PORTABLE CHEST 1 VIEW

[chest ap]
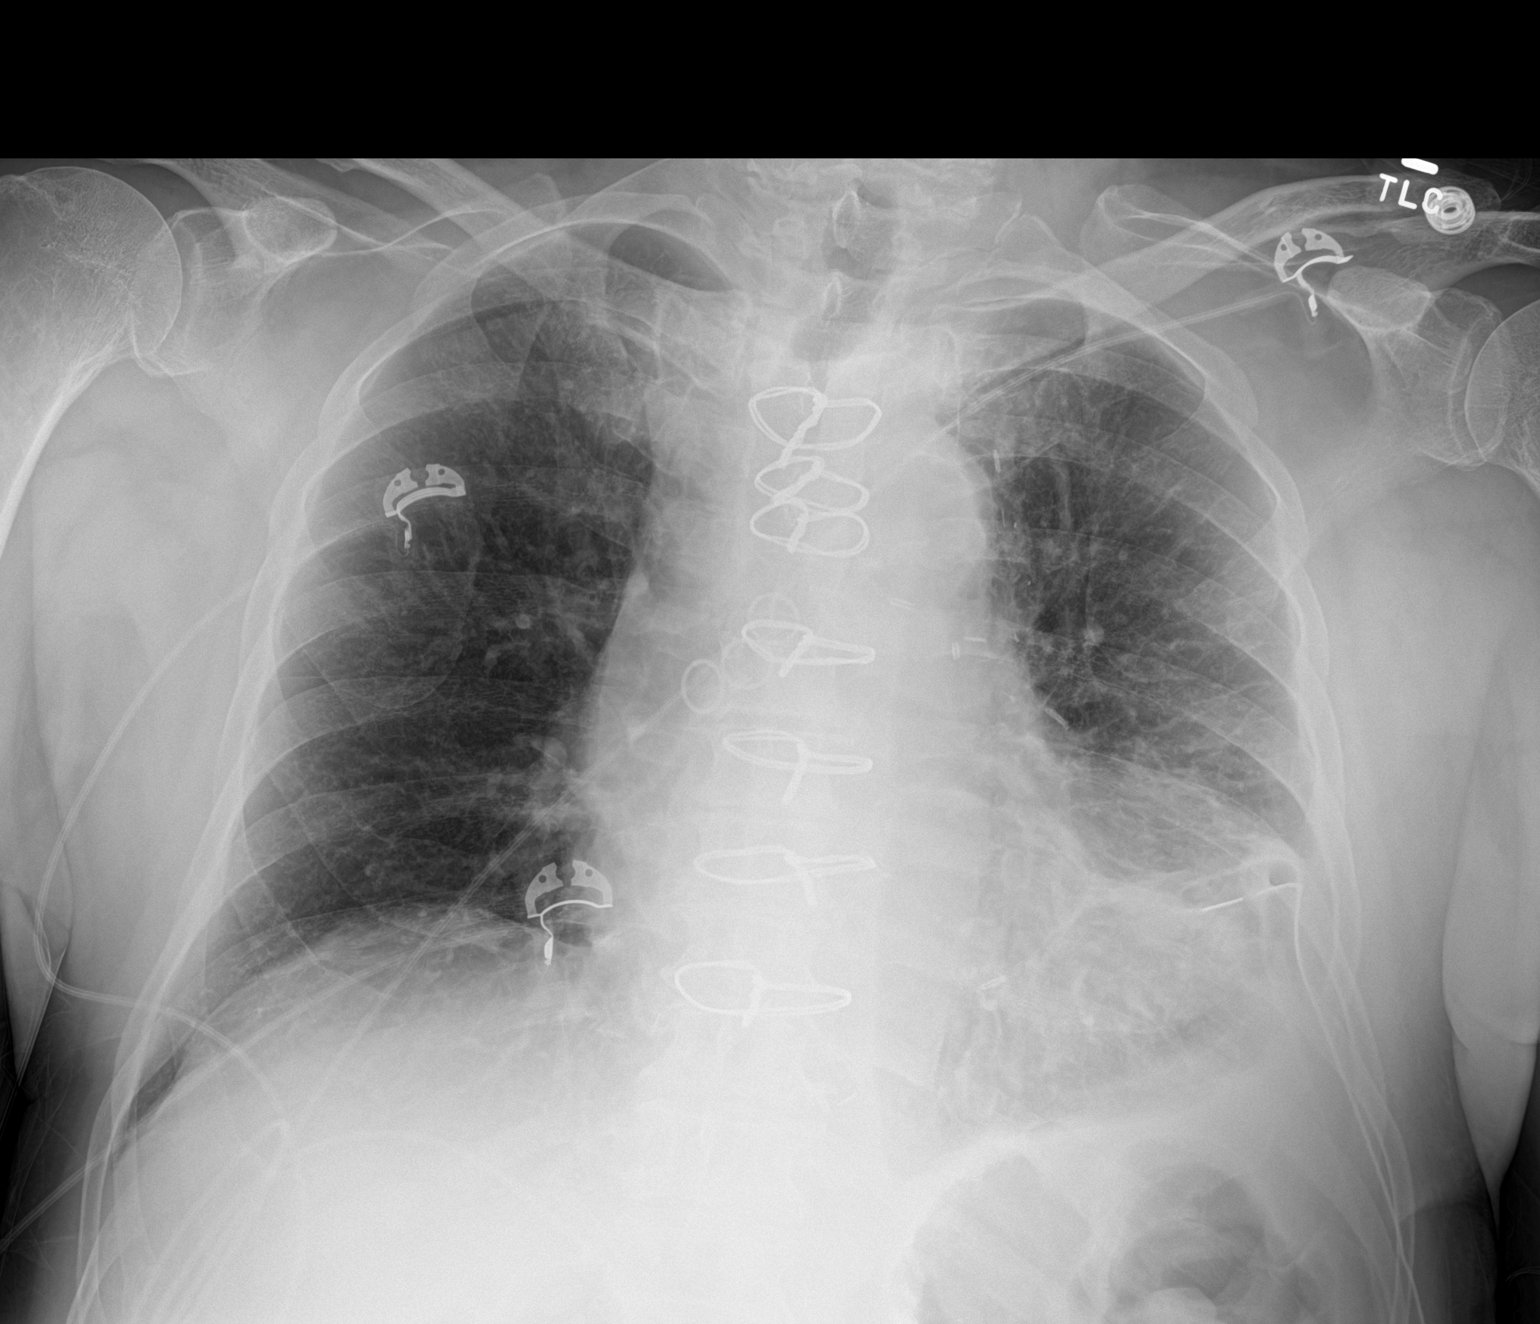

[1 of 1 positions shown; findings below may reference images not displayed]

FINDINGS: Stable cardiomediastinal silhouette. Status post coronary bypass
graft. Left-sided chest tube is unchanged in position. Small left
pleural effusion is noted with associated left basilar atelectasis
or infiltrate. Right lung is clear. Bony thorax is unremarkable.
IMPRESSION: Left-sided chest tube is unchanged in position. Small left pleural
effusion is noted with associated left basilar atelectasis or
infiltrate.

## 2022-04-26 ENCOUNTER — Other Ambulatory Visit: Payer: Self-pay | Admitting: Physician Assistant

## 2022-04-26 ENCOUNTER — Ambulatory Visit
Admission: RE | Admit: 2022-04-26 | Discharge: 2022-04-26 | Disposition: A | Discharge status: Home / Self Care | Payer: Commercial Managed Care - PPO | Source: Ambulatory Visit | Attending: Physician Assistant | Admitting: Physician Assistant

## 2022-04-26 DIAGNOSIS — R053 Chronic cough: Secondary | ICD-10-CM

## 2022-06-28 NOTE — Progress Notes (Signed)
Cardiology Office Note:    Date:  07/01/2022   ID:  Joshua Mack, DOB 19-Aug-1941, MRN [id number]  PCP:  Johny Blamer, MD  [facility] Cardiologist:  Haylie Mccutcheon [place], MD  [facility] Electrophysiologist:  None   Referring MD: Johny Blamer, MD   Chief Complaint  Patient presents with   Coronary Artery Disease    History of Present Illness:    Joshua Mack is a 81 y.o. male with a hx of HTN, HLD, spinal stenosis, prior DVT and CAD.  Patient presented to the hospital on 04/27/2020 for chest pain and shortness of breath and found to have STEMI. Urgent cardiac catheterization performed on 04/24/2020 showed 100% mid LAD occlusion after a large D1 with faint right to left collateral, 90% ramus intermediate, 100% CTO of mid left circumflex with good left to left collaterals, 100% CTO of proximal RCA with right to right and left to right collaterals, LV gram was unable to assess LV function due to underfilling.  There was also moderately elevated LV filling pressure with preserved cardiac output of 4.4 L/min.  Patient was placed on IVBP support.  Echocardiogram obtained on the following day showed EF 30 to 35%, grade 2 DD.  Patient eventually underwent urgent CABG x4 with LIMA to LAD, SVG to PDA, SVG to D1 and SVG to OM 3 by Dr. Cliffton Asters.  Intra-aortic balloon pump was removed during the procedure.   Since discharge, patient returned to the ED on 5/17 after a brief unresponsive episode.  He returned to the hospital again on 5/22 due to sudden expression of fluid from his sternal incision.  He was seen by Dr. Vickey Sages who placed the chest tube and they removed 1300 mL of serosanguineous fluid.  Chest tube was later removed on 5/25.  Repeat limited echocardiogram obtained on 05/09/2020 showed EF 30 to 35%, trivial mitral regurgitation, mild AI.  He was seen by Azalee Course PA-C for follow-up on 05/07/2020, he was hypotensive at the time was blood pressure in the 80s.  He was hydrated and all CHF meds were  held including lasix, metoprolol, and lisinopril. Follow up Echo in July showed EF 35-40%. He was later started on losartan and Toprol. Repeat Echo in May showed improvement in EF to 40-45%. Patient reports he never really started Toprol due to hypotension and low pulse.   On follow up today he reports he feels great. His weight is stable. No edema. No dyspnea or chest pain. Still working  as a Banker. Has 2 children in college. He is exercising regularly. Marland Kitchen He states he never started on Entresto because he read an article that suggested a link to Alzheimer's. He also stopped taking Crestor because he thought it was affecting his memory.   Past Medical History:  Diagnosis Date   CHF (congestive heart failure) (HCC)    Complication of anesthesia    " I had a reaction to versed "   Coronary artery disease    High cholesterol    Hypertension    Sleep apnea     Past Surgical History:  Procedure Laterality Date   CARDIAC CATHETERIZATION     CARDIAC SURGERY     CHEST TUBE INSERTION Left 05/09/2020   CORONARY ARTERY BYPASS GRAFT N/A 04/28/2020   Procedure: CORONARY ARTERY BYPASS GRAFTING (CABG) times four using LIMA to LAD; bilateral endoscopic greater saphenous vein harvest: SVG to Circ; SVG to RAMUS; and SVG to PDA.;  Surgeon: Corliss Skains, MD;  Location: [facility]  OR;  Service: Open Heart Surgery;  Laterality: N/A;   ENDOVEIN HARVEST OF GREATER SAPHENOUS VEIN Bilateral 04/28/2020   Procedure: [name];  Surgeon: Corliss Skains, MD;  Location: [facility] OR;  Service: Open Heart Surgery;  Laterality: Bilateral;   IABP INSERTION N/A 04/27/2020   Procedure: IABP Insertion;  Surgeon: [place], Ahmiyah Coil M, MD;  Location: [facility] INVASIVE CV LAB;  Service: Cardiovascular;  Laterality: N/A;   LUMBAR LAMINECTOMY/DECOMPRESSION MICRODISCECTOMY N/A 05/03/2017   Procedure: Microlumbar decompression L4-5, L3-4,  L5-S1 WITH LATERAL AUTOGRAFT FUSION;  Surgeon: Jene Every,  MD;  Location: [facility] ORS;  Service: Orthopedics;  Laterality: N/A;   RIGHT/LEFT HEART CATH AND CORONARY ANGIOGRAPHY N/A 04/27/2020   Procedure: RIGHT/LEFT HEART CATH AND CORONARY ANGIOGRAPHY;  Surgeon: [place], Nyema Hachey M, MD;  Location: [facility] INVASIVE CV LAB;  Service: Cardiovascular;  Laterality: N/A;    Current Medications: Current Meds  Medication Sig   acetaminophen (TYLENOL) 500 MG tablet Take 500 mg by mouth daily as needed for mild pain or headache.    aspirin EC 81 MG EC tablet Take 1 tablet (81 mg total) by mouth daily.   Cholecalciferol (VITAMIN D-3) 25 MCG (1000 UT) CAPS    losartan (COZAAR) 25 MG tablet TAKE 1 TABLET BY MOUTH EVERY DAY   Misc Natural Products (OSTEO BI-FLEX/5-LOXIN ADVANCED PO) Take 500 mg by mouth.   Multiple Vitamins-Minerals (CENTRUM SILVER 50+MEN) TABS Take 1 tablet by mouth daily with breakfast.   PRESCRIPTION MEDICATION CPAP- At bedtime and during times of rest   Probiotic Product (PROBIOTIC PO) Take 1 capsule by mouth daily as needed (Constipation).   shark liver oil-cocoa butter (PREPARATION H) 0.25-88.44 % suppository Place 1 suppository rectally as needed for hemorrhoids.   sildenafil (VIAGRA) 50 MG tablet Take 1 tablet (50 mg total) by mouth daily as needed for erectile dysfunction.     Allergies:   Clemastine, Fentanyl, Midazolam, Amoxicillin, Other, and Tavist nd [loratadine]   Social History   Socioeconomic History   Marital status: Married    Spouse name: Not on file   Number of children: Not on file   Years of education: 16   Highest education level: Not on file  Occupational History   Not on file  Tobacco Use   Smoking status: Never   Smokeless tobacco: Never  Vaping Use   Vaping Use: Never used  Substance and Sexual Activity   Alcohol use: No   Drug use: No   Sexual activity: Not on file  Other Topics Concern   Not on file  Social History Narrative   Not on file   Social Determinants of Health   Financial Resource Strain: Not on  file  Food Insecurity: Not on file  Transportation Needs: Not on file  Physical Activity: Not on file  Stress: Not on file  Social Connections: Not on file     Family History: The patient's family history is not on file.  ROS:   Please see the history of present illness.     All other systems reviewed and are negative.  EKGs/Labs/Other Studies Reviewed:    The following studies were reviewed today: Cath 04/27/2020 Prox RCA lesion is 100% stenosed. Prox Cx to Mid Cx lesion is 100% stenosed. Ramus lesion is 90% stenosed. Mid LAD lesion is 100% stenosed. LV end diastolic pressure is moderately elevated.   1. 3 vessel occlusive CAD.    - 100% mid LAD after a large diagonal branch. Faint right to left collaterals.    -  90% ramus intermediate    - 100% CTO of the mid LCx with good left to left collaterals    - 100% CTO of the proximal RCA. Good right to right and left to right collaterals. 2. Difficult to assess LV function due to underfilling. Will assess with Echo 3. Moderately elevated LV filling pressures. Improved with IV lasix and IABP 4. Normal right heart pressures 5. Preserved cardiac output 4.4 liters/min   Plan: transfer to ICU. Heparinize. IABP support. CT surgery consultation for CABG. Assess LV function with Echo. Continue diuresis. Trend troponin levels.      Echo 04/28/2020 IMPRESSIONS     1. No intracavitary thrombus is seen. The dyskinetic anteroapical area is  not thinned or hyperechoic, suggesting recent ischemia/infarction. Left  ventricular ejection fraction, by estimation, is 30 to 35%. The left  ventricle has moderate to severely  decreased function. The left ventricle demonstrates regional wall motion  abnormalities (see scoring diagram/findings for description). Left  ventricular diastolic parameters are consistent with Grade II diastolic  dysfunction (pseudonormalization).  Elevated left atrial pressure.   2. Right ventricular systolic function is  normal. The right ventricular  size is normal. A catheter or pacemaker wire is seen in the right  ventricle. is visualized.   3. The mitral valve is normal in structure. No evidence of mitral valve  regurgitation.   4. The aortic valve is normal in structure. Aortic valve regurgitation is  not visualized.    Echo 07/16/20: IMPRESSIONS     1. Septal and apical akinesis mid and basal inferior wall hypokinesis EF  appears slightly improved since CABG . Left ventricular ejection fraction,  by estimation, is 35 to 40%. The left ventricle has moderately decreased  function. The left ventricle  demonstrates regional wall motion abnormalities (see scoring  diagram/findings for description). The left ventricular internal cavity  size was moderately dilated. Left ventricular diastolic parameters were  normal.   2. Right ventricular systolic function is normal. The right ventricular  size is normal.   3. Left atrial size was mild to moderately dilated.   4. The mitral valve is normal in structure. No evidence of mitral valve  regurgitation. No evidence of mitral stenosis.   5. The aortic valve was not well visualized. Aortic valve regurgitation  is not visualized. Mild to moderate aortic valve sclerosis/calcification  is present, without any evidence of aortic stenosis.   6. Aortic dilatation noted. 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   7. The inferior vena cava is normal in size with greater than 50%  respiratory variability, suggesting right atrial pressure of 3 mmHg.   Comparison(s): 05/09/20 EF 30-35%.   Echo 04/26/21: IMPRESSIONS     1. Left ventricular ejection fraction, by estimation, is 40 to 45%. The  left ventricle has mildly decreased function. The left ventricle has no  regional wall motion abnormalities. Left ventricular diastolic parameters  are consistent with Grade I  diastolic dysfunction (impaired relaxation).   2. Right ventricular systolic  function is normal. The right ventricular  size is normal. There is normal pulmonary artery systolic pressure.   3. The mitral valve is normal in structure. Mild mitral valve  regurgitation. No evidence of mitral stenosis.   4. The aortic valve is calcified. There is mild calcification of the  aortic valve. There is mild thickening of the aortic valve. Aortic valve  regurgitation is mild. Mild to moderate aortic valve  sclerosis/calcification is present, without any evidence  of aortic stenosis.   5. The inferior vena cava is normal in size with greater than 50%  respiratory variability, suggesting right atrial pressure of 3 mmHg.   Comparison(s): The left ventricular function has improved.    EKG:  EKG is ordered today. NSR rate 79. LAD. Old inferior and anteroseptal infarcts.no change from prior. I have personally reviewed and interpreted this study.   Recent Labs: No results found for requested labs within last 365 days.  Dated 04/14/22: Normal CMET. Plts 792K. Otherwise CBC normal  Recent Lipid Panel    Component Value Date/Time   CHOL 118 01/21/2021 1038   TRIG 51 01/21/2021 1038   HDL 69 01/21/2021 1038   CHOLHDL 1.7 01/21/2021 1038   CHOLHDL 4.9 04/27/2020 2052   VLDL 30 04/27/2020 2052   LDLCALC 37 01/21/2021 1038    Physical Exam:    VS:  BP 132/72 (BP Location: Left Arm, Patient Position: Sitting, Cuff Size: Normal)   Pulse 79   Resp 20   Ht 5\' 10"  (1.778 m)   Wt 189 lb (85.7 kg)   SpO2 94%   BMI 27.12 kg/m     Wt Readings from Last 3 Encounters:  07/01/22 189 lb (85.7 kg)  06/07/21 176 lb 3.2 oz (79.9 kg)  01/27/21 174 lb 12.8 oz (79.3 kg)     GEN:  Well nourished, well developed in no acute distress HEENT: Normal NECK: No JVD; No carotid bruits LYMPHATICS: No lymphadenopathy CARDIAC: RRR, no murmurs, rubs, gallops. Chest incision has healed well.  RESPIRATORY:  Clear to auscultation without rales, wheezing or rhonchi  ABDOMEN: Soft, non-tender,  non-distended MUSCULOSKELETAL:  No edema; No deformity  SKIN: Warm and dry NEUROLOGIC:  Alert and oriented x 3 PSYCHIATRIC:  Normal affect   ASSESSMENT:    1. Coronary artery disease involving coronary bypass graft of native heart without angina pectoris   2. S/P CABG x 4   3. Hypercholesteremia   4. Essential hypertension   5. Chronic combined systolic and diastolic CHF (congestive heart failure) (HCC)     PLAN:    In order of problems listed above:  CAD s/p emergent CABG in setting of STEMI in May 2021- found to have severe 3 vessel occlusive disease: Continue aspirin.  He is asymptomatic. Not on beta blocker due to hypotension.   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   He is tolerating losartan 25 mg daily but did not tolerate Toprol XL.   He reports intolerance to lisinopril in the past due to fatigue. He still doesn't want to take Entresto. Repeat Echo showed some improvement with EF 40-45%  Hypercholesterolemia: now off Crestor due to concern about memory loss. Will update labs. If not at goal will consider non statin options for treatment.    Follow up in one year.    Medication Adjustments/Labs and Tests Ordered: Current medicines are reviewed at length with the patient today.  Concerns regarding medicines are outlined above.  Orders Placed This Encounter  Procedures   Basic metabolic panel   Lipid panel   Hepatic function panel   EKG 12-Lead   No orders of the defined types were placed in this encounter.   There are no Patient Instructions on file for this visit.   Signed, Siler Mavis June 2021, MD  07/01/2022 10:10 AM    [facility]

## 2022-07-01 ENCOUNTER — Encounter: Payer: Self-pay | Admitting: Cardiology

## 2022-07-01 ENCOUNTER — Ambulatory Visit (INDEPENDENT_AMBULATORY_CARE_PROVIDER_SITE_OTHER): Payer: Commercial Managed Care - PPO | Admitting: Cardiology

## 2022-07-01 VITALS — BP 132/72 | HR 79 | Resp 20 | Ht 70.0 in | Wt 189.0 lb

## 2022-07-01 DIAGNOSIS — I25810 Atherosclerosis of coronary artery bypass graft(s) without angina pectoris: Secondary | ICD-10-CM

## 2022-07-01 DIAGNOSIS — Z951 Presence of aortocoronary bypass graft: Secondary | ICD-10-CM | POA: Diagnosis not present

## 2022-07-01 DIAGNOSIS — I10 Essential (primary) hypertension: Secondary | ICD-10-CM

## 2022-07-01 DIAGNOSIS — E7800 Pure hypercholesterolemia, unspecified: Secondary | ICD-10-CM | POA: Diagnosis not present

## 2022-07-01 DIAGNOSIS — I5042 Chronic combined systolic (congestive) and diastolic (congestive) heart failure: Secondary | ICD-10-CM

## 2022-07-01 LAB — HEPATIC FUNCTION PANEL
ALT: 16 IU/L (ref 0–44)
AST: 24 IU/L (ref 0–40)
Albumin: 4.3 g/dL (ref 3.8–4.8)
Alkaline Phosphatase: 74 IU/L (ref 44–121)
Bilirubin Total: 0.4 mg/dL (ref 0.0–1.2)
Bilirubin, Direct: 0.13 mg/dL (ref 0.00–0.40)
Total Protein: 6.9 g/dL (ref 6.0–8.5)

## 2022-07-01 LAB — BASIC METABOLIC PANEL
BUN/Creatinine Ratio: 13 (ref 10–24)
BUN: 13 mg/dL (ref 8–27)
CO2: 25 mmol/L (ref 20–29)
Calcium: 9.1 mg/dL (ref 8.6–10.2)
Chloride: 99 mmol/L (ref 96–106)
Creatinine, Ser: 1 mg/dL (ref 0.76–1.27)
Glucose: 108 mg/dL — ABNORMAL HIGH (ref 70–99)
Potassium: 4.3 mmol/L (ref 3.5–5.2)
Sodium: 137 mmol/L (ref 134–144)
eGFR: 76 mL/min/{1.73_m2} (ref >59)

## 2022-07-01 LAB — LIPID PANEL
Chol/HDL Ratio: 3.5 ratio (ref 0.0–5.0)
Cholesterol, Total: 177 mg/dL (ref 100–199)
HDL: 51 mg/dL (ref >39)
LDL Chol Calc (NIH): 103 mg/dL — ABNORMAL HIGH (ref 0–99)
Triglycerides: 129 mg/dL (ref 0–149)
VLDL Cholesterol Cal: 23 mg/dL (ref 5–40)

## 2022-07-04 ENCOUNTER — Other Ambulatory Visit: Payer: Self-pay

## 2022-07-04 MED ORDER — EZETIMIBE 10 MG PO TABS: 10.0000 mg | ORAL_TABLET | Freq: Every day | ORAL | 3 refills | Status: DC

## 2022-07-04 NOTE — Progress Notes (Signed)
Zetia

## 2022-07-06 ENCOUNTER — Other Ambulatory Visit: Payer: Self-pay

## 2022-07-06 DIAGNOSIS — I25810 Atherosclerosis of coronary artery bypass graft(s) without angina pectoris: Secondary | ICD-10-CM

## 2022-07-06 DIAGNOSIS — E7800 Pure hypercholesterolemia, unspecified: Secondary | ICD-10-CM

## 2022-07-20 ENCOUNTER — Other Ambulatory Visit: Payer: Self-pay | Admitting: Cardiology

## 2022-10-06 ENCOUNTER — Encounter: Payer: Self-pay | Admitting: Cardiology

## 2022-10-06 NOTE — Telephone Encounter (Signed)
Spoke with patient. He stated the medication is losartan, not lisinopril, for me to check with spouse. I spoke with spouse and patient is taking losartan, zetia, and ASA. She had a concern regarding patient's memory. Recommended to contact PCP regarding memory issues. Patient wants to know if he can stop taking losartan due dry cough at night and go back on amlodipine 10mg  daily. He denies PND/SOB. While on phone, BP 156/88, P 75.

## 2022-10-06 NOTE — Telephone Encounter (Signed)
Patient informed of Dr. Doug Sou answer: "I don't think losartan is causing cough. Lisinopril does. I would continue losartan. Should have cough and memory issues addressed by PCP." Patient verbalized understanding.

## 2022-10-20 ENCOUNTER — Other Ambulatory Visit: Payer: Self-pay | Admitting: Cardiology

## 2022-12-08 ENCOUNTER — Encounter: Payer: Self-pay | Admitting: Cardiology

## 2023-06-24 ENCOUNTER — Other Ambulatory Visit: Payer: Self-pay | Admitting: Cardiology

## 2023-07-15 NOTE — Progress Notes (Unsigned)
Cardiology Clinic Note   Date: 07/17/2023 ID: Joshua Mack, DOB 03/02/1941, MRN [id number]  Primary Cardiologist:  Peter [place], MD  Patient Profile    Joshua Mack is a 82 y.o. male who presents to the clinic today for routine follow up.     Past medical history significant for: CAD. LHC 04/27/2020 (STEMI): Proximal RCA 100% with good right to right and left-to-right collaterals.  Proximal to mid Cx 100% with good left to left collaterals.  Ramus 90%.  Mid LAD 100% faint right to left collaterals.  Moderately elevated LVEDP.  IABP placed.  CTS consult. CABG x 4 04/28/2020: LIMA to LAD, SVG to PDA/D1/OM 3. Chronic combined systolic and diastolic heart failure. Echo 04/28/2020 (following STEMI): EF 30 to 35%.  No intracavitary thrombus seen.  Dyskinetic anteroapical area is not thinned or hyperechoic, suggesting recent ischemia/infarction.  RWMA.  Grade II DD.  Normal RV function. Echo 04/26/2021: EF 40 to 45%.  No RWMA.  Grade I DD.  Normal RV function.  Mild MR.  Mild AI.  Mild to moderate aortic valve sclerosis/calcification without stenosis. Hypertension. Hyperlipidemia. Lipid panel 12/29/2022: LDL 106, HDL 49, TG 189, total 188. DVT. Venous ultrasound bilateral lower extremities 05/07/2017: Acute DVT proximal popliteal vein of the left lower extremity. OSA.     History of Present Illness    Joshua Mack was evaluated by cardiology on 04/27/2020 during hospital admission for chest pain and shortness of breath.  He was found to have a STEMI and was brought urgently to the Cath Lab and was found to have vessel occlusive CAD (details above).  LV gram was unable to assess LV function due to underfilling.  Patient was placed on IV BP support.  Echo showed EF 30 to 35%, Grade II DD.  He underwent CABG x 4 and IABP was removed during surgery.  Patient returned to the ED on 05/04/2020 after a brief unresponsive episode.  He returns to the ED again on 05/09/2020 due to sudden expression of fluid  from sternal incision.  He had a chest tube placed and 1300 mL of serosanguineous fluid was removed.  Patient was last seen in the office by Dr. Sw[place]and on 07/01/2022 for routine follow-up.  He was not taking Entresto secondary to an article he read leaking its use to Alzheimer's.  He also stopped Crestor secondary to memory concerns.  Today, patient is doing well stating "I think I am doing great." Patient denies shortness of breath or dyspnea on exertion. No chest pain, pressure, or tightness. Denies lower extremity edema, orthopnea, or PND. No palpitations. He works full time as a Banker. He does not do dedicated exercise but walks occasionally on the treadmill. He states he was more dedicated with a walking routine until he had covid last year. His wife ensures he follows a healthy diet with limited sodium. He is tolerating Zetia well. Discussed referral to pharm D lipid clinic to discuss options for better cholesterol control. He would like to see what his lipid panel today shows. If it continues to be elevated he is willing to be referred.    ROS: All other systems reviewed and are otherwise negative except as noted in History of Present Illness.  Studies Reviewed    EKG Interpretation Date/Time:  Monday July 17 2023 09:12:31 EDT Ventricular Rate:  72 PR Interval:  136 QRS Duration:  148 QT Interval:  416 QTC Calculation: 455 R Axis:   -46  Text Interpretation: Normal sinus rhythm  Left axis deviation Right bundle branch block Minimal voltage criteria for LVH, may be normal variant ( R in aVL ) Inferior infarct , age undetermined Anteroseptal infarct , age undetermined When compared with ECG of 04-May-2020 18:44, PREVIOUS ECG IS PRESENT Confirmed by Carlos Levering [id number]) on 07/17/2023 9:23:17 AM    Risk Assessment/Calculations      HYPERTENSION CONTROL Vitals:   07/17/23 0905 07/17/23 0955  BP: (!) 160/92 (!) 146/80    The patient's blood pressure is elevated above  target today.  In order to address the patient's elevated BP: The blood pressure is usually elevated in clinic.  Blood pressures monitored at home have been optimal.           Physical Exam    VS:  BP (!) 160/92   Pulse 72   Ht 5\' 10"  (1.778 m)   Wt 191 lb 9.6 oz (86.9 kg)   SpO2 98%   BMI 27.49 kg/m  , BMI Body mass index is 27.49 kg/m.  GEN: Well nourished, well developed, in no acute distress. Neck: No JVD or carotid bruits. Cardiac:  RRR. No murmurs. No rubs or gallops.   Respiratory:  Respirations regular and unlabored. Clear to auscultation without rales, wheezing or rhonchi. GI: Soft, nontender, nondistended. Extremities: Radials/DP/PT 2+ and equal bilaterally. No clubbing or cyanosis. No edema.  Skin: Warm and dry, no rash. Neuro: Strength intact.  Assessment & Plan    CAD.  S/p CABG x 21 Apr 2020. Patient denies chest pain, tightness, pressure. 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 Continue aspirin, Zetia.  Patient not on beta-blocker secondary to hypotension. Encouraged to increase physical activity as tolerated.  Chronic combined systolic and diastolic heart failure.  Echo May 2022 showed EF 40 to 45%, Grade I DD.  Patient denies lower extremity edema, DOE, orthopnea or PND. Euvolemic and well compensated on exam.  Continue losartan. Hypertension: BP today 160/92 on intake and 146/80 on my recheck. He reports it is always high at the doctors office. Home BP typically in the 120s/80s.  Patient denies headaches, dizziness or vision changes. Continue losartan. Hyperlipidemia/statin intolerance.  LDL January 2024 106, not at goal.  Continue Zetia.  Will get repeat lipid panel today. If LDL continues to be elevated patient agrees to lipid clinic referral.   Disposition: Lipid panel and LFTs today. Return in 1 year or sooner as needed.          Signed, Etta Grandchild. Cadence Minton, DNP, NP-C

## 2023-07-17 ENCOUNTER — Ambulatory Visit: Payer: Commercial Managed Care - PPO | Attending: Student | Admitting: Student

## 2023-07-17 ENCOUNTER — Encounter: Payer: Self-pay | Admitting: Student

## 2023-07-17 VITALS — BP 146/80 | HR 72 | Ht 70.0 in | Wt 191.6 lb

## 2023-07-17 DIAGNOSIS — I5042 Chronic combined systolic (congestive) and diastolic (congestive) heart failure: Secondary | ICD-10-CM

## 2023-07-17 DIAGNOSIS — Z789 Other specified health status: Secondary | ICD-10-CM

## 2023-07-17 DIAGNOSIS — I10 Essential (primary) hypertension: Secondary | ICD-10-CM | POA: Diagnosis not present

## 2023-07-17 DIAGNOSIS — E785 Hyperlipidemia, unspecified: Secondary | ICD-10-CM

## 2023-07-17 DIAGNOSIS — Z951 Presence of aortocoronary bypass graft: Secondary | ICD-10-CM

## 2023-07-17 DIAGNOSIS — I2510 Atherosclerotic heart disease of native coronary artery without angina pectoris: Secondary | ICD-10-CM | POA: Diagnosis not present

## 2023-07-17 NOTE — Patient Instructions (Signed)
Medication Instructions:  Your physician recommends that you continue on your current medications as directed. Please refer to the Current Medication list given to you today. If you need a refill on your cardiac medications before your next appointment, please call your pharmacy.  Lab Work: FASTING LABS PERFORMED TODAY If you have labs (blood work) drawn today and your tests are completely normal, you will receive your results only by: MyChart Message (if you have MyChart) OR A paper copy in the mail If you have any lab test that is abnormal or we need to change your treatment, we will call you to review the results.   Testing/Procedures: NONE  Follow-Up: At [facility], you and your health needs are our priority.  As part of our continuing mission to provide you with exceptional heart care, we have created designated Provider Care Teams.  These Care Teams include your primary Cardiologist (physician) and Advanced Practice Providers (APPs -  Physician Assistants and Nurse Practitioners) who all work together to provide you with the care you need, when you need it.  We recommend signing up for the patient portal called "MyChart".  Sign up information is provided on this After Visit Summary.  MyChart is used to connect with patients for Virtual Visits (Telemedicine).  Patients are able to view lab/test results, encounter notes, upcoming appointments, etc.  Non-urgent messages can be sent to your provider as well.   To learn more about what you can do with MyChart, go to [email or web address].    Your next appointment:   1 year(s) A LETTER WILL BE SENT TO YOU AS A REMINDER TO CALL THE OFFICE TO SCHEDULE YOUR APPOINTMENT FOR YOUR 1 YEAR FOLLOW UP WITH DR. Swaziland  Provider:   Peter Swaziland, MD

## 2023-07-24 ENCOUNTER — Other Ambulatory Visit: Payer: Self-pay | Admitting: Cardiology

## 2023-07-26 ENCOUNTER — Other Ambulatory Visit: Payer: Self-pay

## 2023-07-26 DIAGNOSIS — I10 Essential (primary) hypertension: Secondary | ICD-10-CM

## 2023-07-26 DIAGNOSIS — E7800 Pure hypercholesterolemia, unspecified: Secondary | ICD-10-CM

## 2023-07-26 NOTE — Progress Notes (Signed)
Spoke with pt. He will be returning this week to rp lipid panel due to [facility] not resulting the sample given from previous visit.

## 2023-07-29 ENCOUNTER — Emergency Department (HOSPITAL_BASED_OUTPATIENT_CLINIC_OR_DEPARTMENT_OTHER): Payer: Commercial Managed Care - PPO

## 2023-07-29 ENCOUNTER — Encounter (HOSPITAL_BASED_OUTPATIENT_CLINIC_OR_DEPARTMENT_OTHER): Payer: Self-pay

## 2023-07-29 ENCOUNTER — Other Ambulatory Visit: Payer: Self-pay

## 2023-07-29 ENCOUNTER — Emergency Department (HOSPITAL_BASED_OUTPATIENT_CLINIC_OR_DEPARTMENT_OTHER)
Admission: EM | Admit: 2023-07-29 | Discharge: 2023-07-29 | Disposition: A | Discharge status: Home / Self Care | Payer: Commercial Managed Care - PPO | Attending: Emergency Medicine | Admitting: Emergency Medicine

## 2023-07-29 DIAGNOSIS — Z1152 Encounter for screening for COVID-19: Secondary | ICD-10-CM | POA: Diagnosis not present

## 2023-07-29 DIAGNOSIS — I10 Essential (primary) hypertension: Secondary | ICD-10-CM | POA: Insufficient documentation

## 2023-07-29 DIAGNOSIS — Z7982 Long term (current) use of aspirin: Secondary | ICD-10-CM | POA: Insufficient documentation

## 2023-07-29 DIAGNOSIS — D72829 Elevated white blood cell count, unspecified: Secondary | ICD-10-CM | POA: Diagnosis not present

## 2023-07-29 DIAGNOSIS — Z20828 Contact with and (suspected) exposure to other viral communicable diseases: Secondary | ICD-10-CM | POA: Diagnosis not present

## 2023-07-29 DIAGNOSIS — J111 Influenza due to unidentified influenza virus with other respiratory manifestations: Secondary | ICD-10-CM | POA: Diagnosis present

## 2023-07-29 DIAGNOSIS — E871 Hypo-osmolality and hyponatremia: Secondary | ICD-10-CM | POA: Insufficient documentation

## 2023-07-29 LAB — CBC WITH DIFFERENTIAL/PLATELET
Abs Immature Granulocytes: 0.04 10*3/uL (ref 0.00–0.07)
Basophils Absolute: 0 10*3/uL (ref 0.0–0.1)
Basophils Relative: 0 %
Eosinophils Absolute: 0.1 10*3/uL (ref 0.0–0.5)
Eosinophils Relative: 1 %
HCT: 43.3 % (ref 39.0–52.0)
Hemoglobin: 14.8 g/dL (ref 13.0–17.0)
Immature Granulocytes: 0 %
Lymphocytes Relative: 5 %
Lymphs Abs: 0.5 10*3/uL — ABNORMAL LOW (ref 0.7–4.0)
MCH: 30.5 pg (ref 26.0–34.0)
MCHC: 34.2 g/dL (ref 30.0–36.0)
MCV: 89.1 fL (ref 80.0–100.0)
Monocytes Absolute: 0.6 10*3/uL (ref 0.1–1.0)
Monocytes Relative: 6 %
Neutro Abs: 9.6 10*3/uL — ABNORMAL HIGH (ref 1.7–7.7)
Neutrophils Relative %: 88 %
Platelets: 252 10*3/uL (ref 150–400)
RBC: 4.86 MIL/uL (ref 4.22–5.81)
RDW: 12.5 % (ref 11.5–15.5)
WBC: 11 10*3/uL — ABNORMAL HIGH (ref 4.0–10.5)
nRBC: 0 % (ref 0.0–0.2)

## 2023-07-29 LAB — COMPREHENSIVE METABOLIC PANEL
ALT: 25 U/L (ref 0–44)
AST: 33 U/L (ref 15–41)
Albumin: 4.2 g/dL (ref 3.5–5.0)
Alkaline Phosphatase: 64 U/L (ref 38–126)
Anion gap: 12 (ref 5–15)
BUN: 13 mg/dL (ref 8–23)
CO2: 22 mmol/L (ref 22–32)
Calcium: 8.9 mg/dL (ref 8.9–10.3)
Chloride: 98 mmol/L (ref 98–111)
Creatinine, Ser: 1.03 mg/dL (ref 0.61–1.24)
GFR, Estimated: >60 mL/min (ref >60)
Glucose, Bld: 111 mg/dL — ABNORMAL HIGH (ref 70–99)
Potassium: 3.6 mmol/L (ref 3.5–5.1)
Sodium: 132 mmol/L — ABNORMAL LOW (ref 135–145)
Total Bilirubin: 0.8 mg/dL (ref 0.3–1.2)
Total Protein: 7.4 g/dL (ref 6.5–8.1)

## 2023-07-29 LAB — URINALYSIS, ROUTINE W REFLEX MICROSCOPIC
Bilirubin Urine: NEGATIVE
Glucose, UA: NEGATIVE mg/dL
Ketones, ur: NEGATIVE mg/dL
Leukocytes,Ua: NEGATIVE
Nitrite: NEGATIVE
Protein, ur: NEGATIVE mg/dL
Specific Gravity, Urine: 1.02 (ref 1.005–1.030)
pH: 7 (ref 5.0–8.0)

## 2023-07-29 LAB — RESP PANEL BY RT-PCR (RSV, FLU A&B, COVID)  RVPGX2
Influenza A by PCR: NEGATIVE
Influenza B by PCR: NEGATIVE
Resp Syncytial Virus by PCR: NEGATIVE
SARS Coronavirus 2 by RT PCR: NEGATIVE

## 2023-07-29 LAB — TROPONIN I (HIGH SENSITIVITY): Troponin I (High Sensitivity): 17 ng/L (ref <18)

## 2023-07-29 LAB — URINALYSIS, MICROSCOPIC (REFLEX)

## 2023-07-29 MED ORDER — SODIUM CHLORIDE 0.9 % IV BOLUS
1000.0000 mL | Freq: Once | INTRAVENOUS | Status: AC
  Administered 2023-07-29: 1000 mL via INTRAVENOUS

## 2023-07-29 MED ORDER — OSELTAMIVIR PHOSPHATE 75 MG PO CAPS: 75.0000 mg | ORAL_CAPSULE | Freq: Two times a day (BID) | ORAL | 0 refills | Status: AC

## 2023-07-29 MED ORDER — ACETAMINOPHEN 325 MG PO TABS
650.0000 mg | ORAL_TABLET | Freq: Once | ORAL | Status: AC | PRN
  Administered 2023-07-29: 650 mg via ORAL
  Filled 2023-07-29: qty 2

## 2023-07-29 NOTE — ED Provider Notes (Incomplete)
82 year old male history of CHF, CAD, hypertension presenting for fever, cough.  Patient is had multiple sick contacts at home with influenza.  Today he has had cough, congestion, fever, myalgias.  No chest pain, no shortness of breath or hemoptysis.  No abdominal pain or vomiting or diarrhea.  No urinary symptoms.  He feels great otherwise.  His flu testing here is negative, though I suspect this could be a false negative given multiple sick contacts at home with flu and his symptoms are consistent with this.  He is febrile, tachycardic.  EKG is pending.  Will attain chest x-ray to evaluate for possible pneumonia as well as workup to evaluate for other sources of infection.  He does not appear to be septic at this time.  Plan for workup, reevaluation after fever control, fluids.

## 2023-07-29 NOTE — ED Notes (Signed)
Called lab about Trop add on.

## 2023-07-29 NOTE — Discharge Instructions (Signed)
You are seen in the emergency department for flulike illness.  Your labs were thankfully all reassuring without any acute abnormality noted and respiratory viral panel test was negative for flu, COVID-19, or RSV.  However given your exposure to multiple sick contacts at home with tested positive for influenza, would advise that you likely have contracted flu and this test is a false negative test.  I have gone ahead and sent a prescription for Tamiflu to your pharmacy to take over the next 5 days.  Please follow-up with your primary care provider for further evaluation of your symptoms.  If symptoms are worsening, please return the emergency department.

## 2023-07-29 NOTE — ED Provider Notes (Signed)
[place] [facility] AT [facility] Provider Note   CSN: [id number] Arrival date & time: 07/29/23  1333     History Chief Complaint  Patient presents with   Influenza    Joshua Mack is a 82 y.o. male.  Patient with past history significant for hypertension, DVT, STEMI, pleural effusion who presents to the emergency department complaints of influenza.  Reports that he recently began feeling sick chest this morning with cough, fever, congestion.  Patient is denying any shortness of breath or chest pain at this time.  He reports that he was exposed to another dividual who was sick at home as his son recently tested positive for influenza.  Denies any significant past medical history but does take medications for hypertension and hypercholesterolemia.  States that he did have a heart attack several years ago but does not recall any other past medical history.   Influenza Presenting symptoms: cough and fever        Home Medications Prior to Admission medications   Medication Sig Start Date End Date Taking? Authorizing Provider  oseltamivir (TAMIFLU) 75 MG capsule Take 1 capsule (75 mg total) by mouth every 12 (twelve) hours for 5 days. 07/29/23 08/03/23 Yes Smitty Knudsen, PA-C  acetaminophen (TYLENOL) 500 MG tablet Take 500 mg by mouth daily as needed for mild pain or headache.  Patient not taking: Reported on 07/17/2023    [provider]  aspirin EC 81 MG EC tablet Take 1 tablet (81 mg total) by mouth daily. 05/03/20   Barrett, Rae Roam, PA-C  Cholecalciferol (VITAMIN D-3) 25 MCG (1000 UT) CAPS  08/24/20   [provider]  ezetimibe (ZETIA) 10 MG tablet TAKE 1 TABLET BY MOUTH EVERY DAY 06/26/23   [place], Peter M, MD  losartan (COZAAR) 25 MG tablet TAKE 1 TABLET BY MOUTH EVERY DAY 07/24/23   Carlos Levering, NP  Misc Natural Products (OSTEO BI-FLEX/5-LOXIN ADVANCED PO) Take 500 mg by mouth.    [provider]  Multiple Vitamins-Minerals  (CENTRUM SILVER 50+MEN) TABS Take 1 tablet by mouth daily with breakfast.    [provider]  PRESCRIPTION MEDICATION CPAP- At bedtime and during times of rest    [provider]  Probiotic Product (PROBIOTIC PO) Take 1 capsule by mouth daily as needed (Constipation).    [provider]  shark liver oil-cocoa butter (PREPARATION H) 0.25-88.44 % suppository Place 1 suppository rectally as needed for hemorrhoids.    [provider]  sildenafil (VIAGRA) 50 MG tablet Take 1 tablet (50 mg total) by mouth daily as needed for erectile dysfunction. 06/07/21   [place], Peter M, MD      Allergies    Clemastine, Fentanyl, Midazolam, Amoxicillin, Other, and Tavist nd [loratadine]    Review of Systems   Review of Systems  Constitutional:  Positive for fever.  Respiratory:  Positive for cough.   All other systems reviewed and are negative.   Physical Exam Updated Vital Signs BP (!) 150/89   Pulse (!) 115   Temp 99.9 F (37.7 C) (Oral)   Resp 18   Ht 5\' 10"  (1.778 m)   Wt 86.2 kg   SpO2 97%   BMI 27.26 kg/m  Physical Exam Vitals and nursing note reviewed.  Constitutional:      General: He is not in acute distress.    Appearance: Normal appearance. He is well-developed. He is not ill-appearing.  HENT:     Head: Normocephalic and atraumatic.  Nose: Congestion present.  Eyes:     Conjunctiva/sclera: Conjunctivae normal.  Cardiovascular:     Rate and Rhythm: Regular rhythm. Tachycardia present.     Pulses: Normal pulses.     Heart sounds: Normal heart sounds. No murmur heard. Pulmonary:     Effort: Pulmonary effort is normal. No respiratory distress.     Breath sounds: Normal breath sounds. No wheezing.  Chest:     Chest wall: No tenderness.  Abdominal:     Palpations: Abdomen is soft.     Tenderness: There is no abdominal tenderness.  Musculoskeletal:        General: No swelling.     Cervical back: Neck supple.  Skin:    General: Skin is warm  and dry.     Capillary Refill: Capillary refill takes less than 2 seconds.  Neurological:     Mental Status: He is alert.  Psychiatric:        Mood and Affect: Mood normal.     ED Results / Procedures / Treatments   Labs (all labs ordered are listed, but only abnormal results are displayed) Labs Reviewed  CBC WITH DIFFERENTIAL/PLATELET - Abnormal; Notable for the following components:      Result Value   WBC 11.0 (*)    Neutro Abs 9.6 (*)    Lymphs Abs 0.5 (*)    All other components within normal limits  COMPREHENSIVE METABOLIC PANEL - Abnormal; Notable for the following components:   Sodium 132 (*)    Glucose, Bld 111 (*)    All other components within normal limits  URINALYSIS, ROUTINE W REFLEX MICROSCOPIC - Abnormal; Notable for the following components:   Hgb urine dipstick TRACE (*)    All other components within normal limits  URINALYSIS, MICROSCOPIC (REFLEX) - Abnormal; Notable for the following components:   Bacteria, UA RARE (*)    All other components within normal limits  RESP PANEL BY RT-PCR (RSV, FLU A&B, COVID)  [id number]  TROPONIN I (HIGH SENSITIVITY)    EKG None  Radiology DG Chest Port 1 View  Result Date: 07/29/2023 CLINICAL DATA:  Influenza EXAM: PORTABLE CHEST 1 VIEW COMPARISON:  None Available. FINDINGS: Sternotomy wires overlie normal cardiac silhouette. Normal pulmonary vasculature. No effusion, infiltrate, or pneumothorax. No acute osseous abnormality. IMPRESSION: No acute cardiopulmonary process. Electronically Signed   By: Genevive Bi M.D.   On: 07/29/2023 15:53    Procedures Procedures   Medications Ordered in ED Medications  acetaminophen (TYLENOL) tablet 650 mg (650 mg Oral Given 07/29/23 1417)  sodium chloride 0.9 % bolus 1,000 mL (0 mLs Intravenous Stopped 07/29/23 1627)    ED Course/ Medical Decision Making/ A&P                               Medical Decision Making Amount and/or Complexity of Data Reviewed Labs:  ordered. Radiology: ordered.  Risk OTC drugs.   This patient presents to the ED for concern of influenza.  Differential diagnosis includes influenza A, viral URI, pneumonia, COVID-19, PE   Lab Tests:  I Ordered, and personally interpreted labs.  The pertinent results include: CBC with mild leukocytosis at 11.0, CMP with mild hyponatremia at 132, UA without obvious signs of infection, troponin at 17 but negative, her viral panel is also negative for influenza, COVID-19, respiratory syncytial virus.   Imaging Studies ordered:  I ordered imaging studies including chest x-ray I independently visualized and interpreted imaging which showed  no acute cardiopulmonary process I agree with the radiologist interpretation   Medicines ordered and prescription drug management:  I ordered medication including Tylenol, fluids for fever, tachycardia Reevaluation of the patient after these medicines showed that the patient improved I have reviewed the patients home medicines and have made adjustments as needed   Problem List / ED Course:  Patient presented to the emergency department complaints of influenza.  Reports that he has a son who recently tested positive for flu.  Patient reports that symptoms began this morning and has not taken anything to manage his symptoms.  Past history significant for hypertension, hypercholesterolemia, MI but denies any chest pain, shortness of breath, nausea, vomiting, abdominal pain at this time.  Given patient's age and presentation with fever and tachycardia, will order basic lab workup including CBC, CMP, respiratory panel, UA, chest x-ray.  Will also treat with Tylenol for fever and fluids for current tachycardia.  Suspect this is all likely a byproduct of current viral process.  Will reassess patient shortly.  Do not believe that patient currently is meeting code sepsis criteria. Labs and imaging are all unremarkable he normal without any acute normality noted.   Mild hyponatremia noted on labs likely due to some dehydration as patient is also tachycardic at this time.  Fluids were given to correct this with heart rate improving.  However given that multiple family numbers have tested positive for influenza, I believe the patient medically also has influenza and currently has a false negative read.  Given age and comorbidities, will prescribe patient Tamiflu twice daily for the next 5 days.  Patient is in agreement with this treatment plan.  Discussed tricked return precautions such as shortness of breath, chest pain, severe fever, weakness.  All questions answered prior to patient discharge.  Patient does not appear to be in any acute distress requiring further evaluation or admission to the hospital.   Final Clinical Impression(s) / ED Diagnoses Final diagnoses:  Influenza-like illness  Exposure to influenza  Hyponatremia    Rx / DC Orders ED Discharge Orders          Ordered    oseltamivir (TAMIFLU) 75 MG capsule  Every 12 hours        07/29/23 1700              Smitty Knudsen, PA-C 07/29/23 1703    Laurence Spates, MD 07/30/23 [phone]

## 2023-07-29 NOTE — ED Triage Notes (Signed)
The patient has body aches, fever, cough, congestion. His son has Flu.

## 2023-07-29 NOTE — ED Notes (Signed)
Assisted patient to the bathroom. 

## 2024-06-20 ENCOUNTER — Encounter: Payer: Self-pay | Admitting: Cardiology

## 2024-06-30 ENCOUNTER — Other Ambulatory Visit: Payer: Self-pay | Admitting: Cardiology

## 2024-07-05 ENCOUNTER — Encounter: Payer: Self-pay | Admitting: Cardiology

## 2024-07-05 ENCOUNTER — Other Ambulatory Visit (HOSPITAL_COMMUNITY): Payer: Self-pay

## 2024-07-08 MED ORDER — EZETIMIBE 10 MG PO TABS: 10.0000 mg | ORAL_TABLET | Freq: Every day | ORAL | 0 refills | Status: DC

## 2024-08-02 ENCOUNTER — Other Ambulatory Visit: Payer: Self-pay | Admitting: Student

## 2024-08-17 NOTE — Progress Notes (Unsigned)
 Cardiology Office Note:    Date:  08/22/2024   ID:  Joshua Mack, DOB 1941/07/17, MRN [id number]  PCP:  Arloa Elsie SAUNDERS, MD  [facility] Cardiologist:  Prapti Grussing [place], MD  [facility] Electrophysiologist:  None   Referring MD: Arloa Elsie SAUNDERS, MD   Chief Complaint  Patient presents with   Congestive Heart Failure   Coronary Artery Disease    History of Present Illness:    Joshua Mack is a 83 y.o. male with a hx of HTN, HLD, spinal stenosis, prior DVT and CAD.  Patient presented to the hospital on 04/27/2020 for chest pain and shortness of breath and found to have STEMI. Urgent cardiac catheterization performed on 04/24/2020 showed 100% mid LAD occlusion after a large D1 with faint right to left collateral, 90% ramus intermediate, 100% CTO of mid left circumflex with good left to left collaterals, 100% CTO of proximal RCA with right to right and left to right collaterals, LV gram was unable to assess LV function due to underfilling.  There was also moderately elevated LV filling pressure with preserved cardiac output of 4.4 L/min.  Patient was placed on IVBP support.  Echocardiogram obtained on the following day showed EF 30 to 35%, grade 2 DD.  Patient eventually underwent urgent CABG x4 with LIMA to LAD, SVG to PDA, SVG to D1 and SVG to OM 3 by Dr. Shyrl.  Intra-aortic balloon pump was removed during the procedure.   Since discharge, patient returned to the ED on 5/17 after a brief unresponsive episode.  He returned to the hospital again on 5/22 due to sudden expression of fluid from his sternal incision.  He was seen by Dr. German who placed the chest tube and they removed 1300 mL of serosanguineous fluid.  Chest tube was later removed on 5/25.  Repeat limited echocardiogram obtained on 05/09/2020 showed EF 30 to 35%, trivial mitral regurgitation, mild AI.  He was seen by Scot Ford PA-C for follow-up on 05/07/2020, he was hypotensive at the time was blood pressure in the 80s.  He was  hydrated and all CHF meds were held including lasix , metoprolol , and lisinopril . Follow up Echo in July showed EF 35-40%. He was later started on losartan  and Toprol . Repeat Echo in May showed improvement in EF to 40-45%. Patient reports he never really started Toprol  due to hypotension and low pulse.   On follow up today he reports he feels great. His weight is stable. No edema. No dyspnea or chest pain. 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 Walks on treadmill at times.  He states he never started on Entresto  because he read an article that suggested a link to Alzheimer's. He also stopped taking Crestor  because he thought it was affecting his memory.  Currently just on losartan  and Zetia .   Past Medical History:  Diagnosis Date   CHF (congestive heart failure) (HCC)    Complication of anesthesia     I had a reaction to versed    Coronary artery disease    High cholesterol    Hypertension    Sleep apnea     Past Surgical History:  Procedure Laterality Date   CARDIAC CATHETERIZATION     CARDIAC SURGERY     CHEST TUBE INSERTION Left 05/09/2020   CORONARY ARTERY BYPASS GRAFT N/A 04/28/2020   Procedure: CORONARY ARTERY BYPASS GRAFTING (CABG) times four using LIMA to LAD; bilateral endoscopic greater saphenous vein harvest: SVG to Circ; SVG to  RAMUS; and SVG to PDA.;  Surgeon: Shyrl Linnie KIDD, MD;  Location: [facility] OR;  Service: Open Heart Surgery;  Laterality: N/A;   ENDOVEIN HARVEST OF GREATER SAPHENOUS VEIN Bilateral 04/28/2020   Procedure: Jethro Rubins Of Greater Saphenous Vein;  Surgeon: Shyrl Linnie KIDD, MD;  Location: [facility] OR;  Service: Open Heart Surgery;  Laterality: Bilateral;   IABP INSERTION N/A 04/27/2020   Procedure: IABP Insertion;  Surgeon: [place], Mckinley Olheiser M, MD;  Location: [facility] INVASIVE CV LAB;  Service: Cardiovascular;  Laterality: N/A;   LUMBAR LAMINECTOMY/DECOMPRESSION MICRODISCECTOMY N/A 05/03/2017   Procedure: Microlumbar decompression L4-5, L3-4,   L5-S1 WITH LATERAL AUTOGRAFT FUSION;  Surgeon: Duwayne Purchase, MD;  Location: [facility] ORS;  Service: Orthopedics;  Laterality: N/A;   RIGHT/LEFT HEART CATH AND CORONARY ANGIOGRAPHY N/A 04/27/2020   Procedure: RIGHT/LEFT HEART CATH AND CORONARY ANGIOGRAPHY;  Surgeon: [place], Mylinh Cragg M, MD;  Location: [facility] INVASIVE CV LAB;  Service: Cardiovascular;  Laterality: N/A;    Current Medications: Current Meds  Medication Sig   acetaminophen  (TYLENOL ) 500 MG tablet Take 500 mg by mouth daily as needed for mild pain or headache.    aspirin  EC 81 MG EC tablet Take 1 tablet (81 mg total) by mouth daily.   Misc Natural Products (OSTEO BI-FLEX/5-LOXIN ADVANCED PO) Take 500 mg by mouth.   Multiple Vitamins-Minerals (CENTRUM SILVER 50+MEN) TABS Take 1 tablet by mouth daily with breakfast.   PRESCRIPTION MEDICATION CPAP- At bedtime and during times of rest   Probiotic Product (PROBIOTIC PO) Take 1 capsule by mouth daily as needed (Constipation).   shark liver oil-cocoa butter (PREPARATION H) 0.25-88.44 % suppository Place 1 suppository rectally as needed for hemorrhoids.   sildenafil  (VIAGRA ) 50 MG tablet Take 1 tablet (50 mg total) by mouth daily as needed for erectile dysfunction.   [DISCONTINUED] ezetimibe  (ZETIA ) 10 MG tablet Take 1 tablet (10 mg total) by mouth daily.   [DISCONTINUED] losartan  (COZAAR ) 25 MG tablet TAKE 1 TABLET BY MOUTH EVERY DAY     Allergies:   Clemastine, Fentanyl , Midazolam, Amoxicillin, Other, and Tavist nd [loratadine]   Social History   Socioeconomic History   Marital status: Married    Spouse name: Not on file   Number of children: Not on file   Years of education: 16   Highest education level: Not on file  Occupational History   Not on file  Tobacco Use   Smoking status: Never   Smokeless tobacco: Never  Vaping Use   Vaping status: Never Used  Substance and Sexual Activity   Alcohol use: No   Drug use: No   Sexual activity: Not on file  Other Topics Concern   Not on file   Social History Narrative   Not on file   Social Drivers of Health   Financial Resource Strain: Not on file  Food Insecurity: Not on file  Transportation Needs: Not on file  Physical Activity: Not on file  Stress: Not on file  Social Connections: Not on file     Family History: The patient's family history is not on file.  ROS:   Please see the history of present illness.     All other systems reviewed and are negative.  EKGs/Labs/Other Studies Reviewed:    The following studies were reviewed today: Cath 04/27/2020 Prox RCA lesion is 100% stenosed. Prox Cx to Mid Cx lesion is 100% stenosed. Ramus lesion is 90% stenosed. Mid LAD lesion is 100% stenosed. LV end diastolic pressure is moderately elevated.   1. 3  vessel occlusive CAD.    - 100% mid LAD after a large diagonal branch. Faint right to left collaterals.    - 90% ramus intermediate    - 100% CTO of the mid LCx with good left to left collaterals    - 100% CTO of the proximal RCA. Good right to right and left to right collaterals. 2. Difficult to assess LV function due to underfilling. Will assess with Echo 3. Moderately elevated LV filling pressures. Improved with IV lasix  and IABP 4. Normal right heart pressures 5. Preserved cardiac output 4.4 liters/min   Plan: transfer to ICU. Heparinize. IABP support. CT surgery consultation for CABG. Assess LV function with Echo. Continue diuresis. Trend troponin levels.      Echo 04/28/2020 IMPRESSIONS     1. No intracavitary thrombus is seen. The dyskinetic anteroapical area is  not thinned or hyperechoic, suggesting recent ischemia/infarction. Left  ventricular ejection fraction, by estimation, is 30 to 35%. The left  ventricle has moderate to severely  decreased function. The left ventricle demonstrates regional wall motion  abnormalities (see scoring diagram/findings for description). Left  ventricular diastolic parameters are consistent with Grade II diastolic   dysfunction (pseudonormalization).  Elevated left atrial pressure.   2. Right ventricular systolic function is normal. The right ventricular  size is normal. A catheter or pacemaker wire is seen in the right  ventricle. is visualized.   3. The mitral valve is normal in structure. No evidence of mitral valve  regurgitation.   4. The aortic valve is normal in structure. Aortic valve regurgitation is  not visualized.    Echo 07/16/20: IMPRESSIONS     1. Septal and apical akinesis mid and basal inferior wall hypokinesis EF  appears slightly improved since CABG . Left ventricular ejection fraction,  by estimation, is 35 to 40%. The left ventricle has moderately decreased  function. The left ventricle  demonstrates regional wall motion abnormalities (see scoring  diagram/findings for description). The left ventricular internal cavity  size was moderately dilated. Left ventricular diastolic parameters were  normal.   2. Right ventricular systolic function is normal. The right ventricular  size is normal.   3. Left atrial size was mild to moderately dilated.   4. The mitral valve is normal in structure. No evidence of mitral valve  regurgitation. No evidence of mitral stenosis.   5. The aortic valve was not well visualized. Aortic valve regurgitation  is not visualized. Mild to moderate aortic valve sclerosis/calcification  is present, without any evidence of aortic stenosis.   6. Aortic dilatation noted. 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   7. The inferior vena cava is normal in size with greater than 50%  respiratory variability, suggesting right atrial pressure of 3 mmHg.   Comparison(s): 05/09/20 EF 30-35%.   Echo 04/26/21: IMPRESSIONS     1. Left ventricular ejection fraction, by estimation, is 40 to 45%. The  left ventricle has mildly decreased function. The left ventricle has no  regional wall motion abnormalities. Left ventricular diastolic parameters   are consistent with Grade I  diastolic dysfunction (impaired relaxation).   2. Right ventricular systolic function is normal. The right ventricular  size is normal. There is normal pulmonary artery systolic pressure.   3. The mitral valve is normal in structure. Mild mitral valve  regurgitation. No evidence of mitral stenosis.   4. The aortic valve is calcified. There is mild calcification of the  aortic valve. There is mild  thickening of the aortic valve. Aortic valve  regurgitation is mild. Mild to moderate aortic valve  sclerosis/calcification is present, without any evidence  of aortic stenosis.   5. The inferior vena cava is normal in size with greater than 50%  respiratory variability, suggesting right atrial pressure of 3 mmHg.   Comparison(s): The left ventricular function has improved.    EKG Interpretation Date/Time:  Thursday August 22 2024 08:14:41 EDT Ventricular Rate:  80 PR Interval:  172 QRS Duration:  146 QT Interval:  408 QTC Calculation: 470 R Axis:   -61  Text Interpretation: Sinus rhythm with occasional Premature ventricular complexes Left axis deviation Right bundle branch block Minimal voltage criteria for LVH, may be normal variant ( R in aVL ) Inferior infarct , age undetermined Anterolateral infarct , age undetermined When compared with ECG of 29-Jul-2023 15:37, HR has slowed from 117 to 80 bpm Confirmed by [place], Kathrine Rieves [phone]) on 08/22/2024 8:20:11 AM     Recent Labs: No results found for requested labs within last 365 days.  Dated 04/14/22: Normal CMET. Plts 792K. Otherwise CBC normal Dated 09/18/23: cholesterol 175, triglycerides 144, HDL 50, LDL 100. A1c 5.4%. CMET and CBC normal.   Recent Lipid Panel    Component Value Date/Time   CHOL 177 07/01/2022 1027   TRIG 129 07/01/2022 1027   HDL 51 07/01/2022 1027   CHOLHDL 3.5 07/01/2022 1027   CHOLHDL 4.9 04/27/2020 2052   VLDL 30 04/27/2020 2052   LDLCALC 103 (H) 07/01/2022 1027    Physical  Exam:    VS:  BP 122/78   Pulse 80   Ht 5' 10 (1.778 m)   Wt 195 lb 6.4 oz (88.6 kg)   SpO2 97%   BMI 28.04 kg/m     Wt Readings from Last 3 Encounters:  08/22/24 195 lb 6.4 oz (88.6 kg)  07/29/23 190 lb (86.2 kg)  07/17/23 191 lb 9.6 oz (86.9 kg)     GEN:  Well nourished, well developed in no acute distress HEENT: Normal NECK: No JVD; No carotid bruits LYMPHATICS: No lymphadenopathy CARDIAC: RRR, no murmurs, rubs, gallops. Chest incision has healed well.  RESPIRATORY:  Clear to auscultation without rales, wheezing or rhonchi  ABDOMEN: Soft, non-tender, non-distended MUSCULOSKELETAL:  No edema; No deformity  SKIN: Warm and dry NEUROLOGIC:  Alert and oriented x 3 PSYCHIATRIC:  Normal affect   ASSESSMENT:    1. Coronary artery disease involving native coronary artery of native heart without angina pectoris   2. Essential hypertension   3. Hypercholesteremia   4. Chronic combined systolic (congestive) and diastolic (congestive) heart failure (HCC)      PLAN:    In order of problems listed above:  CAD s/p emergent CABG in setting of STEMI in May 2021- found to have severe 3 vessel occlusive disease: Continue aspirin .  He is asymptomatic. Not on beta blocker due to hypotension.   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  Well compensated on losartan  alone. Euvolemic. Asymptomatic. He still doesn't want to take Entresto . Last Echo showed some improvement with EF 40-45%  Hypercholesterolemia: now off Crestor  due to concern about memory loss. On Zetia . LDL not at goal but he is not really interested in additional therapy   Follow up in one year.    Medication Adjustments/Labs and Tests Ordered: Current medicines are reviewed at length with the patient today.  Concerns regarding medicines are outlined above.  Orders Placed This Encounter  Procedures   EKG 12-Lead  Meds ordered this encounter  Medications   losartan  (COZAAR ) 25 MG tablet    Sig: Take 1  tablet (25 mg total) by mouth daily.    Dispense:  90 tablet    Refill:  3    Please keep upcoming appointment for any future refills Thank you   ezetimibe  (ZETIA ) 10 MG tablet    Sig: Take 1 tablet (10 mg total) by mouth daily.    Dispense:  90 tablet    Refill:  3    Please schedule a 1 year follow-up appointment for additional refills    There are no Patient Instructions on file for this visit.   Signed, Trudi Morgenthaler [place], MD  08/22/2024 8:26 AM    [facility]

## 2024-08-22 ENCOUNTER — Ambulatory Visit: Attending: Cardiology | Admitting: Cardiology

## 2024-08-22 ENCOUNTER — Encounter: Payer: Self-pay | Admitting: Cardiology

## 2024-08-22 VITALS — BP 122/78 | HR 80 | Ht 70.0 in | Wt 195.4 lb

## 2024-08-22 DIAGNOSIS — I5042 Chronic combined systolic (congestive) and diastolic (congestive) heart failure: Secondary | ICD-10-CM | POA: Diagnosis not present

## 2024-08-22 DIAGNOSIS — E7800 Pure hypercholesterolemia, unspecified: Secondary | ICD-10-CM | POA: Diagnosis not present

## 2024-08-22 DIAGNOSIS — I2510 Atherosclerotic heart disease of native coronary artery without angina pectoris: Secondary | ICD-10-CM

## 2024-08-22 DIAGNOSIS — I10 Essential (primary) hypertension: Secondary | ICD-10-CM | POA: Diagnosis not present

## 2024-08-22 MED ORDER — EZETIMIBE 10 MG PO TABS: 10.0000 mg | ORAL_TABLET | Freq: Every day | ORAL | 3 refills | Status: AC

## 2024-08-22 MED ORDER — LOSARTAN POTASSIUM 25 MG PO TABS: 25.0000 mg | ORAL_TABLET | Freq: Every day | ORAL | 3 refills | Status: AC

## 2024-08-22 NOTE — Patient Instructions (Signed)
 Medication Instructions:  Continue same medications *If you need a refill on your cardiac medications before your next appointment, please call your pharmacy*  Lab Work: None ordered  Testing/Procedures: None ordered  Follow-Up: At [facility], you and your health needs are our priority.  As part of our continuing mission to provide you with exceptional heart care, our providers are all part of one team.  This team includes your primary Cardiologist (physician) and Advanced Practice Providers or APPs (Physician Assistants and Nurse Practitioners) who all work together to provide you with the care you need, when you need it.  Your next appointment:  1 year   Call in May to schedule Sept appointment      Provider:  Dr.Jordan   We recommend signing up for the patient portal called MyChart.  Sign up information is provided on this After Visit Summary.  MyChart is used to connect with patients for Virtual Visits (Telemedicine).  Patients are able to view lab/test results, encounter notes, upcoming appointments, etc.  Non-urgent messages can be sent to your provider as well.   To learn more about what you can do with MyChart, go to [email or web address].
